# Patient Record
Sex: Female | Born: 1935 | Race: White | Hispanic: No | Smoking: Never smoker
Health system: Southern US, Community
[De-identification: ages and names within clinical notes are randomized; demographics above are authoritative.]

## PROBLEM LIST (undated history)

## (undated) DIAGNOSIS — I6529 Occlusion and stenosis of unspecified carotid artery: Secondary | ICD-10-CM

## (undated) DIAGNOSIS — R42 Dizziness and giddiness: Secondary | ICD-10-CM

## (undated) DIAGNOSIS — I4891 Unspecified atrial fibrillation: Secondary | ICD-10-CM

## (undated) DIAGNOSIS — E079 Disorder of thyroid, unspecified: Secondary | ICD-10-CM

## (undated) DIAGNOSIS — I10 Essential (primary) hypertension: Secondary | ICD-10-CM

## (undated) DIAGNOSIS — K219 Gastro-esophageal reflux disease without esophagitis: Secondary | ICD-10-CM

## (undated) DIAGNOSIS — I8390 Asymptomatic varicose veins of unspecified lower extremity: Secondary | ICD-10-CM

## (undated) DIAGNOSIS — K5730 Diverticulosis of large intestine without perforation or abscess without bleeding: Secondary | ICD-10-CM

## (undated) DIAGNOSIS — E785 Hyperlipidemia, unspecified: Secondary | ICD-10-CM

## (undated) HISTORY — DX: Asymptomatic varicose veins of unspecified lower extremity: I83.90

## (undated) HISTORY — DX: Diverticulosis of large intestine without perforation or abscess without bleeding: K57.30

## (undated) HISTORY — DX: Essential (primary) hypertension: I10

## (undated) HISTORY — DX: Hyperlipidemia, unspecified: E78.5

## (undated) HISTORY — DX: Unspecified atrial fibrillation: I48.91

## (undated) HISTORY — DX: Occlusion and stenosis of unspecified carotid artery: I65.29

## (undated) HISTORY — DX: Disorder of thyroid, unspecified: E07.9

## (undated) HISTORY — DX: Dizziness and giddiness: R42

## (undated) HISTORY — DX: Gastro-esophageal reflux disease without esophagitis: K21.9

## (undated) HISTORY — PX: APPENDECTOMY: SHX54

---

## 1988-06-09 HISTORY — PX: CAROTID ENDARTERECTOMY: SUR193

## 2001-07-21 ENCOUNTER — Encounter: Admission: RE | Admit: 2001-07-21 | Discharge: 2001-07-21 | Payer: Self-pay | Admitting: Geriatric Medicine

## 2001-07-21 ENCOUNTER — Encounter: Payer: Self-pay | Admitting: Geriatric Medicine

## 2001-10-20 ENCOUNTER — Encounter: Admission: RE | Admit: 2001-10-20 | Discharge: 2001-10-20 | Payer: Self-pay | Admitting: Geriatric Medicine

## 2001-10-20 ENCOUNTER — Encounter: Payer: Self-pay | Admitting: Geriatric Medicine

## 2002-07-27 ENCOUNTER — Ambulatory Visit (HOSPITAL_COMMUNITY): Admission: RE | Admit: 2002-07-27 | Discharge: 2002-07-27 | Payer: Self-pay | Admitting: Geriatric Medicine

## 2002-10-25 ENCOUNTER — Encounter: Payer: Self-pay | Admitting: Geriatric Medicine

## 2002-10-25 ENCOUNTER — Encounter: Admission: RE | Admit: 2002-10-25 | Discharge: 2002-10-25 | Payer: Self-pay | Admitting: Geriatric Medicine

## 2003-07-18 ENCOUNTER — Other Ambulatory Visit: Admission: RE | Admit: 2003-07-18 | Discharge: 2003-07-18 | Payer: Self-pay | Admitting: Geriatric Medicine

## 2003-10-27 ENCOUNTER — Encounter: Admission: RE | Admit: 2003-10-27 | Discharge: 2003-10-27 | Payer: Self-pay | Admitting: Geriatric Medicine

## 2004-11-13 ENCOUNTER — Encounter: Admission: RE | Admit: 2004-11-13 | Discharge: 2004-11-13 | Payer: Self-pay | Admitting: Geriatric Medicine

## 2005-01-21 ENCOUNTER — Encounter: Admission: RE | Admit: 2005-01-21 | Discharge: 2005-01-21 | Payer: Self-pay | Admitting: Geriatric Medicine

## 2006-01-20 ENCOUNTER — Encounter: Admission: RE | Admit: 2006-01-20 | Discharge: 2006-01-20 | Payer: Self-pay | Admitting: Geriatric Medicine

## 2006-08-11 ENCOUNTER — Other Ambulatory Visit: Admission: RE | Admit: 2006-08-11 | Discharge: 2006-08-11 | Payer: Self-pay | Admitting: Geriatric Medicine

## 2007-01-27 ENCOUNTER — Encounter: Admission: RE | Admit: 2007-01-27 | Discharge: 2007-01-27 | Payer: Self-pay | Admitting: Geriatric Medicine

## 2008-01-31 ENCOUNTER — Encounter: Admission: RE | Admit: 2008-01-31 | Discharge: 2008-01-31 | Payer: Self-pay | Admitting: Geriatric Medicine

## 2008-02-04 ENCOUNTER — Ambulatory Visit: Payer: Self-pay | Admitting: Vascular Surgery

## 2008-08-02 ENCOUNTER — Encounter: Admission: RE | Admit: 2008-08-02 | Discharge: 2008-08-02 | Payer: Self-pay | Admitting: Geriatric Medicine

## 2009-02-01 ENCOUNTER — Encounter: Admission: RE | Admit: 2009-02-01 | Discharge: 2009-02-01 | Payer: Self-pay | Admitting: Geriatric Medicine

## 2010-02-04 ENCOUNTER — Encounter: Admission: RE | Admit: 2010-02-04 | Discharge: 2010-02-04 | Payer: Self-pay | Admitting: Geriatric Medicine

## 2010-02-05 ENCOUNTER — Ambulatory Visit: Payer: Self-pay | Admitting: Vascular Surgery

## 2010-06-18 ENCOUNTER — Inpatient Hospital Stay (HOSPITAL_COMMUNITY)
Admission: EM | Admit: 2010-06-18 | Discharge: 2010-06-20 | Payer: Self-pay | Source: Home / Self Care | Attending: Internal Medicine | Admitting: Internal Medicine

## 2010-06-18 ENCOUNTER — Encounter (INDEPENDENT_AMBULATORY_CARE_PROVIDER_SITE_OTHER): Payer: Self-pay | Admitting: Internal Medicine

## 2010-06-19 ENCOUNTER — Encounter (INDEPENDENT_AMBULATORY_CARE_PROVIDER_SITE_OTHER): Payer: Self-pay | Admitting: Internal Medicine

## 2010-06-24 LAB — POCT I-STAT, CHEM 8
BUN: 17 mg/dL (ref 6–23)
Calcium, Ion: 1.13 mmol/L (ref 1.12–1.32)
Chloride: 104 mEq/L (ref 96–112)
Creatinine, Ser: 1.1 mg/dL (ref 0.4–1.2)
Glucose, Bld: 122 mg/dL — ABNORMAL HIGH (ref 70–99)
HCT: 38 % (ref 36.0–46.0)
Hemoglobin: 12.9 g/dL (ref 12.0–15.0)
Potassium: 3.6 mEq/L (ref 3.5–5.1)
Sodium: 139 mEq/L (ref 135–145)
TCO2: 25 mmol/L (ref 0–100)

## 2010-06-24 LAB — DIFFERENTIAL
Basophils Absolute: 0 10*3/uL (ref 0.0–0.1)
Basophils Absolute: 0 10*3/uL (ref 0.0–0.1)
Basophils Relative: 0 % (ref 0–1)
Basophils Relative: 0 % (ref 0–1)
Eosinophils Absolute: 0.3 10*3/uL (ref 0.0–0.7)
Eosinophils Absolute: 0.1 10*3/uL (ref 0.0–0.7)
Eosinophils Relative: 3 % (ref 0–5)
Eosinophils Relative: 1 % (ref 0–5)
Lymphocytes Relative: 14 % (ref 12–46)
Lymphocytes Relative: 14 % (ref 12–46)
Lymphs Abs: 1.3 10*3/uL (ref 0.7–4.0)
Lymphs Abs: 1.3 10*3/uL (ref 0.7–4.0)
Monocytes Absolute: 0.6 10*3/uL (ref 0.1–1.0)
Monocytes Absolute: 0.6 10*3/uL (ref 0.1–1.0)
Monocytes Relative: 7 % (ref 3–12)
Monocytes Relative: 6 % (ref 3–12)
Neutro Abs: 6.9 10*3/uL (ref 1.7–7.7)
Neutro Abs: 7.4 10*3/uL (ref 1.7–7.7)
Neutrophils Relative %: 76 % (ref 43–77)
Neutrophils Relative %: 79 % — ABNORMAL HIGH (ref 43–77)

## 2010-06-24 LAB — CBC
HCT: 37.6 % (ref 36.0–46.0)
HCT: 37 % (ref 36.0–46.0)
HCT: 36.7 % (ref 36.0–46.0)
Hemoglobin: 12.2 g/dL (ref 12.0–15.0)
Hemoglobin: 12.2 g/dL (ref 12.0–15.0)
Hemoglobin: 11.8 g/dL — ABNORMAL LOW (ref 12.0–15.0)
MCH: 30.3 pg (ref 26.0–34.0)
MCH: 30.7 pg (ref 26.0–34.0)
MCH: 30.4 pg (ref 26.0–34.0)
MCHC: 32.4 g/dL (ref 30.0–36.0)
MCHC: 33 g/dL (ref 30.0–36.0)
MCHC: 32.2 g/dL (ref 30.0–36.0)
MCV: 93.3 fL (ref 78.0–100.0)
MCV: 93.2 fL (ref 78.0–100.0)
MCV: 94.6 fL (ref 78.0–100.0)
Platelets: 284 10*3/uL (ref 150–400)
Platelets: 267 10*3/uL (ref 150–400)
Platelets: 272 10*3/uL (ref 150–400)
RBC: 4.03 MIL/uL (ref 3.87–5.11)
RBC: 3.97 MIL/uL (ref 3.87–5.11)
RBC: 3.88 MIL/uL (ref 3.87–5.11)
RDW: 13.2 % (ref 11.5–15.5)
RDW: 13.4 % (ref 11.5–15.5)
RDW: 13.6 % (ref 11.5–15.5)
WBC: 9 10*3/uL (ref 4.0–10.5)
WBC: 9.3 10*3/uL (ref 4.0–10.5)
WBC: 8.7 10*3/uL (ref 4.0–10.5)

## 2010-06-24 LAB — TROPONIN I
Troponin I: 0.1 ng/mL — ABNORMAL HIGH (ref 0.00–0.06)
Troponin I: 1.47 ng/mL (ref 0.00–0.06)
Troponin I: 1.21 ng/mL (ref 0.00–0.06)

## 2010-06-24 LAB — CK TOTAL AND CKMB (NOT AT ARMC)
CK, MB: 4.6 ng/mL — ABNORMAL HIGH (ref 0.3–4.0)
CK, MB: 18.8 ng/mL (ref 0.3–4.0)
CK, MB: 11.6 ng/mL (ref 0.3–4.0)
Relative Index: INVALID (ref 0.0–2.5)
Relative Index: 10.5 — ABNORMAL HIGH (ref 0.0–2.5)
Relative Index: 7.9 — ABNORMAL HIGH (ref 0.0–2.5)
Total CK: 95 U/L (ref 7–177)
Total CK: 179 U/L — ABNORMAL HIGH (ref 7–177)
Total CK: 146 U/L (ref 7–177)

## 2010-06-24 LAB — LIPID PANEL
Cholesterol: 147 mg/dL (ref 0–200)
HDL: 70 mg/dL (ref >39)
LDL Cholesterol: 69 mg/dL (ref 0–99)
Total CHOL/HDL Ratio: 2.1 RATIO
Triglycerides: 42 mg/dL (ref <150)
VLDL: 8 mg/dL (ref 0–40)

## 2010-06-24 LAB — COMPREHENSIVE METABOLIC PANEL
ALT: 17 U/L (ref 0–35)
AST: 27 U/L (ref 0–37)
Albumin: 3.3 g/dL — ABNORMAL LOW (ref 3.5–5.2)
Alkaline Phosphatase: 62 U/L (ref 39–117)
BUN: 11 mg/dL (ref 6–23)
CO2: 26 mEq/L (ref 19–32)
Calcium: 9.3 mg/dL (ref 8.4–10.5)
Chloride: 104 mEq/L (ref 96–112)
Creatinine, Ser: 0.84 mg/dL (ref 0.4–1.2)
GFR calc Af Amer: >60 mL/min (ref >60)
GFR calc non Af Amer: >60 mL/min (ref >60)
Glucose, Bld: 107 mg/dL — ABNORMAL HIGH (ref 70–99)
Potassium: 3.9 mEq/L (ref 3.5–5.1)
Sodium: 138 mEq/L (ref 135–145)
Total Bilirubin: 0.4 mg/dL (ref 0.3–1.2)
Total Protein: 6.1 g/dL (ref 6.0–8.3)

## 2010-06-24 LAB — HEPARIN LEVEL (UNFRACTIONATED)
Heparin Unfractionated: 0.43 IU/mL (ref 0.30–0.70)
Heparin Unfractionated: 0.33 IU/mL (ref 0.30–0.70)

## 2010-06-24 LAB — BASIC METABOLIC PANEL
BUN: 13 mg/dL (ref 6–23)
CO2: 24 mEq/L (ref 19–32)
Calcium: 8.8 mg/dL (ref 8.4–10.5)
Chloride: 111 mEq/L (ref 96–112)
Creatinine, Ser: 0.9 mg/dL (ref 0.4–1.2)
GFR calc Af Amer: >60 mL/min (ref >60)
GFR calc non Af Amer: 60 mL/min — ABNORMAL LOW (ref >60)
Glucose, Bld: 86 mg/dL (ref 70–99)
Potassium: 3.9 mEq/L (ref 3.5–5.1)
Sodium: 141 mEq/L (ref 135–145)

## 2010-06-24 LAB — CARDIAC PANEL(CRET KIN+CKTOT+MB+TROPI)
CK, MB: 5.7 ng/mL — ABNORMAL HIGH (ref 0.3–4.0)
CK, MB: 5 ng/mL — ABNORMAL HIGH (ref 0.3–4.0)
CK, MB: 3.4 ng/mL (ref 0.3–4.0)
Relative Index: 5.5 — ABNORMAL HIGH (ref 0.0–2.5)
Relative Index: 4.3 — ABNORMAL HIGH (ref 0.0–2.5)
Relative Index: 3.2 — ABNORMAL HIGH (ref 0.0–2.5)
Total CK: 104 U/L (ref 7–177)
Total CK: 115 U/L (ref 7–177)
Total CK: 107 U/L (ref 7–177)
Troponin I: 0.65 ng/mL (ref 0.00–0.06)
Troponin I: 0.51 ng/mL (ref 0.00–0.06)
Troponin I: 0.47 ng/mL — ABNORMAL HIGH (ref 0.00–0.06)

## 2010-06-24 LAB — MAGNESIUM: Magnesium: 2.2 mg/dL (ref 1.5–2.5)

## 2010-06-24 LAB — PHOSPHORUS: Phosphorus: 3.6 mg/dL (ref 2.3–4.6)

## 2010-06-24 LAB — POCT CARDIAC MARKERS
CKMB, poc: 3.5 ng/mL (ref 1.0–8.0)
Myoglobin, poc: 238 ng/mL (ref 12–200)
Troponin i, poc: 0.09 ng/mL (ref 0.00–0.09)

## 2010-06-24 LAB — HEMOGLOBIN A1C
Hgb A1c MFr Bld: 5.8 % — ABNORMAL HIGH (ref <5.7)
Mean Plasma Glucose: 120 mg/dL — ABNORMAL HIGH (ref <117)

## 2010-06-24 LAB — PROTIME-INR
INR: 0.94 (ref 0.00–1.49)
Prothrombin Time: 12.8 seconds (ref 11.6–15.2)

## 2010-06-24 LAB — APTT: aPTT: 27 seconds (ref 24–37)

## 2010-06-24 LAB — TSH
TSH: 1.262 u[IU]/mL (ref 0.350–4.500)
TSH: 0.916 u[IU]/mL (ref 0.350–4.500)

## 2010-06-28 NOTE — Consult Note (Signed)
[name] Love, [name] Love                ACCOUNT NO.:  [id number]  MEDICAL RECORD NO.:  [id number]          PATIENT TYPE:  INP  LOCATION:  2006                         FACILITY:  [facility]  PHYSICIAN:  Jake Bathe, MD      DATE OF BIRTH:  Mar 09, 1923  DATE OF CONSULTATION:  06/18/2010 DATE OF DISCHARGE:                                CONSULTATION   PRIMARY CARE PHYSICIAN:  Hal T. Stoneking, MD  REQUESTING PHYSICIAN:  [facility].  REASON FOR CONSULTATION:  Non-ST-elevation myocardial infarction in the setting of atrial fibrillation and rapid ventricular response.  HISTORY OF PRESENT ILLNESS:  This is an 77 year old female with a history of peripheral vascular disease status post carotid endarterectomy approximately 20 years ago, orthostatic hypotension, and hypothyroidism who was admitted from the [facility] in atrial fibrillation and rapid ventricular response.  During this episode which occurred last night at approximately 10:30, she was complaining of chest tightness and some minor difficulty breathing.  After approximately 30 minutes to an hour, in fact during transport over to the hospital, the symptoms resolved.  While she has been here in the hospital, her EKG has demonstrated sinus rhythm with very subtle elevation in her ST-segment in the lead V2 with biphasic T-wave in V1, possible ischemia.  I was called to assess her given the elevation in cardiac biomarkers. Her first set of markers drawn in the hospital were CK of 95, MB of 4.6, and a troponin of 0.1.  The second set was drawn and showed a CK of 179, MB of 18.8, and a troponin of 1.47 consistent with non-ST-elevation myocardial infarction.  Currently, she is asymptomatic.  In fact, she said, "I wished to go home" and she is here with her daughter and son-in- Social worker.  She has not had any prior myocardial infarction and she does state that occasionally she has brief episodes of dizziness or minor gait instability at  times especially when getting up from a lying down or seated position.  Dr. Tawanna Cooler Early follows her carotid arteries.  PAST MEDICAL HISTORY: 1. Orthostatic hypotension. 2. Hypothyroidism. 3. Peripheral vascular disease, carotid artery disease, status post     endarterectomy approximately 20 years ago. 4. Hyperlipidemia.  MEDICATIONS: 1. Aspirin 81 mg a day. 2. Calcium 500 mg twice a day. 3. Levothyroxine 150 mcg daily. 4. Multivitamin. 5. Simvastatin 40 mg a day.  SOCIAL HISTORY:  No smoking.  No drinking.  No drug use.  She is independent, lives at the [facility].  FAMILY HISTORY:  Her father died of a stroke.  ALLERGIES:  No known drug allergies.  REVIEW OF SYSTEMS:  No bleeding.  No wheezing.  No rashes.  No orthopnea.  No chest pain currently.  Unless as stated above, all other 12 review of systems are negative.  PHYSICAL EXAMINATION:  VITAL SIGNS:  Temperature 97.3, blood pressure 139/78, pulse is currently in the 60s and 70s, respirations 17, and saturating 100% on room air. GENERAL:  Pleasant, alert and oriented x3, in no acute distress, here with a family at bedside. EYES:  Well-perfused conjunctivae.  EOMI.  No scleral icterus. NECK:  Supple.  No lymphadenopathy.  No thyromegaly.  No carotid bruits. No JVD. HEENT:  Normocephalic and atraumatic. CARDIOVASCULAR:  Regular rate and rhythm with no appreciable murmurs, rubs, or gallops. LUNGS:  Clear to auscultation bilaterally.  Normal respiratory effort. ABDOMEN:  Soft, nontender, and normoactive bowel sounds.  No rebound. No guarding.  No bruits heard. EXTREMITIES:  No clubbing, cyanosis, or edema.  Normal distal pulses. GU:  Deferred. RECTAL:  Deferred. NEUROLOGIC:  Nonfocal.  No tremors are noted. PSYCH:  Normal affect.  LABORATORY DATA:  Cardiac biomarkers as described above.  Total cholesterol 147, LDL 69, and HDL 70.  Chest x-ray showing no acute cardiopulmonary process.  This was personally reviewed.   EKG as described above.  Her EKG from 1 a.m. last night is relatively unchanged from her current EKG.  Prior medical records from Dr. Laverle Hobby office reviewed.  EMS strips revealed atrial fibrillation, rapid ventricular response with associated ST-segment depression, possible repolarization versus ischemia.  ASSESSMENT/PLAN:  This is an 77 year old female with non-ST-elevation myocardial infarction; new-onset atrial fibrillation, now in sinus rhythm, paroxysmal; with hyperlipidemia; hypothyroidism; and orthostatic hypotension. 1. Non-ST-elevation myocardial function - I had a lengthy discussion     with her and her family about the risks and benefits of an invasive     versus noninvasive or medical management.  At this point, we will     continue with a noninvasive aggressive medical management and will     place her on IV heparin per ACS protocol, Plavix, aspirin, statin,     low-dose beta-blocker, and nitroglycerin as needed.  I described to     her that 77 year old female cohort may not have quite the benefit     from an invasive approach from a mortality perspective.  She was     not very enthusiastic about an invasive procedure.  Of course, we     will assess with echocardiogram and if she has any ongoing or     return of discomfort or worsening symptoms or significant wall     motion abnormality on echocardiogram, we may wish to proceed with     cardiac catheterization to see if there is any coronary artery     disease which will be amenable to stenting.  Full family discussion     was had.  They understand the risks and benefits.  They understand     the increase mortality of non-ST-elevation myocardial infarction. 2. Atrial fibrillation - new onset paroxysmal - I agree with prior     admitting physician that she should be on Coumadin and she and her     family will think about this.  Originally, she had refused this.     This will reduce her stroke risk.  Of course, we would  have to     watch closely for signs of bleeding. 3. Hypothyroidism - per primary team, on levothyroxine.  We will check     TSH. 4. Hyperlipidemia - continue low-dose statin.  We will follow along.  Thank you for this consult.     Jake Bathe, MD     MCS/MEDQ  D:  06/18/2010  T:  06/19/2010  Job:  [id number]  cc:   Hal T. Stoneking, M.D.  Electronically Signed by Donato Schultz MD on 06/28/2010 07:03:48 AM

## 2010-07-04 NOTE — Discharge Summary (Addendum)
[name] Love, [name] Love                ACCOUNT NO.:  [id number]  MEDICAL RECORD NO.:  [id number]          PATIENT TYPE:  INP  LOCATION:  2006                         FACILITY:  [facility]  PHYSICIAN:  Kela Millin, M.D.DATE OF BIRTH:  1922-10-04  DATE OF ADMISSION:  06/18/2010 DATE OF DISCHARGE:  06/20/2010                        DISCHARGE SUMMARY - REFERRING   DISCHARGE DIAGNOSIS: 1. Non-ST elevation myocardial infarction. 2. Paroxysmal atrial fibrillation. 3. Hypothyroidism. 4. Hyperlipidemia. 5. Osteopenia. 6. History of carotid artery disease and status post endarterectomy     about 20 years ago. 7. History of orthostatic hypotension.  PROCEDURES AND STUDIES: 1. Chest x-ray - no acute cardiopulmonary process.  Potential     pulmonary parenchymal scarring at the right lung apex. 2. Left shoulder x-ray - osteopenic appearance of bones.  There is a     small area of sclerosis of the distal superior aspect of the leftclavicle with a possible small erosive change.  Adjacent to this     area is vague calcific density in the joint space. ? History of     gout alternatively degenerative and reflect previous trauma.  CONSULTATIONS:  Cardiology - Dr. Donato Schultz.  BRIEF HISTORY:  - The patient is a an 77 year old white female with the above-listed medical problems who presented with palpitations.  She stated that she had not been feeling well on the day of presentation and later that day as she got ready to go to bed, she began experiencing palpitations and she laid down to try to rest but this persisted and she had some chest tightness associated with it.  She denied shortness of breath.  She also denied cough.  No nausea, vomiting.  Because her symptoms were persisting, she called the [name] staff and was transported to [facility] per EMS.  En route, EMS noted that the patient was in atrial fibrillation with rapid ventricular response, but she converted to normal sinus rhythm  upon arrival in the ED.  Love COURSE: 1. A non-ST elevation myocardial infarction - Upon admission, the     first set of point of care markers obtained in the ED showed a     troponin of 0.09 but followup cardiac enzymes showed the next     troponin to be elevated at 0.10 and the next one 1.47.  The EKG did     not show any acute ischemic findings.  Cardiology was consulted in     the ED and Dr. Donato Schultz saw the patient and had a lengthy     discussion with the patient and her family about the risks and     benefits of an invasive versus noninvasive or medical management.     The patient was placed on IV heparin, per acute coronary syndrome     protocol, as well as Plavix, aspirin and Zocor as well as low-dose     beta blocker and nitroglycerin as needed.  Following Dr. Anne Fu     discussion with the patient, as already mentioned, she was not very     enthusiastic about an invasive approach.  He indicated that an  echocardiogram would be done and if the patient had any ongoing or     return of discomfort or worsening of her symptoms or significant     wall motion abnormality on the echocardiogram, that a cardiac cath     may be done to see if there was any coronary artery disease     amenable to stenting.  The patient was maintained on medical     management and she did not have any further chest pain in the     Love.  The 2-D echocardiogram was done and revealed an ejection     fraction of 60% to 65% with no diagnostic regional wall motion     abnormalities.  With her remaining asymptomatic, no invasive     procedures were pursued and Dr. Anne Fu followed up with the patient     today and has recommended, from his standpoint, that she is okay     for discharge on Plavix for at least 1 month, aspirin, Zocor and     metoprolol.  She is to follow up with him on January 19 at 3:15     p.m.. 2. Paroxysmal atrial fibrillation, new onset - Upon admission, the     patient was  placed on beta blockers and heparin.  A TSH was     obtained and came back within normal limits at 0.916.  Cardiac     enzymes were cycled and the patient was found to have had a non-ST     elevation myocardial infarction, as discussed above.  The admitting     physician discussed the possibility of starting Coumadin with the     patient, but she refused stating that she did not want to start it     as her husband had died from massive blood loss while he was on     Coumadin.  Cardiology followed up with the patient and also stated     that they agreed with placing the patient on Coumadin and at that     point, the patient stated that she would discuss it with her family     and subsequently did so but even following that; she still stated     that she did not want to be placed on Coumadin.  The risks of     stroke were fully explained to the patient and family and they     voiced understanding.  Dr. Anne Fu place the patient on aspirin and     Plavix, as already mentioned.  She converted to normal sinus rhythm     and today on discharge, is remaining in normal sinus rhythm. 3. Hypothyroidism - She was maintained on her levothyroxine during her     Love stay, as already mentioned.  A TSH was checked and within     normal limits. 4. Hyperlipidemia - She was maintained on Zocor during her Love     stay. 5. Osteopenia - The patient complained of pain in her left shoulder     blade area while in the Love and x-rays were obtained and     showed findings consistent with osteopenia.  A small area of     sclerosis of the distal superior aspect of the left clavicle with     possible small erosive change was noted and also adjacent to this     area, a vague calcific density in the joint space.  The patient has     denied a history of  gout, per radiologist, possibly degenerative     and may reflect a previous trauma.  Tylenol p.r.n. was recommended     for the intermittent pain.  As for  the osteopenia, she is on     calcium and she is to continue her current calcium/vitamin D     supplements and follow up with her primary care physician.  DISCHARGE MEDICATIONS: 1. Plavix 75 mg p.o. daily. 2. Metoprolol 25 mg 1 p.o. b.i.d. 3. Aspirin 81 mg p.o. daily. 4. Calcium citrate/vitamin D 500/200 units 1 p.o. b.i.d. 5. Levothyroxine 150 mcg p.o. daily. 6. Multivitamins 1 p.o. daily. 7. Zocor 40 mg p.o. q.h.s.  FOLLOWUP CARE: 1. Primary care physician, Dr. Merlene Laughter, as scheduled on June 21, 2010. 2. Dr. Donato Schultz on June 27, 2010 at 3:15 p.m.  DISCHARGE CONDITION:  - Improved/stable.     Kela Millin, M.D.     ACV/MEDQ  D:  06/20/2010  T:  06/20/2010  Job:  [id number]  cc:   Hal T. Pete Glatter, M.D. Jake Bathe, MD  Electronically Signed by Donnalee Curry M.D. on 07/04/2010 06:32:12 AM

## 2010-07-06 NOTE — H&P (Signed)
[name] Love, Love                ACCOUNT NO.:  [id number]  MEDICAL RECORD NO.:  [id number]          PATIENT TYPE:  EMS  LOCATION:  [facility]                         FACILITY:  [facility]  PHYSICIAN:  Michiel Cowboy, MDDATE OF BIRTH:  1923-04-28  DATE OF ADMISSION:  06/18/2010 DATE OF DISCHARGE:                             HISTORY & PHYSICAL   ATTENDING PHYSICIAN:  Michiel Cowboy, MD  PRIMARY CARE PROVIDER:  Hal T. Stoneking, MD  CHIEF COMPLAINT:  The patient is an 77 year old female with history of hyperlipidemia and carotid artery disease status endarterectomy in the remote past.  The patient otherwise has been feeling very well.  Today at around 10:30 p.m., she was brushing her teeth before going to bed when she noticed heart palpitations.  She laid down and tried to rest, but they persisted and this was associated with chest tightness.  No shortness of breath.  No recent fever or chills.  No coughing.  No nausea and no vomiting.  No constipation.  No diarrhea.  Otherwise, review of systems is negative.  Of note, she did have similar feeling about a year ago which resolved by itself.  At this time, this has persisted and she called up to a nursing desk at [facility] and was transported to [facility].  In route, she was found to be with atrial fibrillation and RVR.  By the time she arrived to emergency department, she converted to sinus rhythm.  Her chest pain has currently resolved. She otherwise is asymptomatic.  REVIEW OF SYSTEMS:  Above, otherwise negative.  PAST MEDICAL HISTORY:  Significant for hypercholesteremia and hypothyroidism, and history of coronary artery disease status post endarterectomy.  SOCIAL HISTORY:  The patient never smoked or drink.  Does not abuse drugs.  Patient is independent, lives in [facility].  FAMILY HISTORY:  Significant for father who died of stroke.  ALLERGIES:  No known drug allergies.  MEDICATIONS: 1. Aspirin 81 mg  daily. 2. Calcium 500 mg twice a day. 3. Levothyroxine 150 mcg daily. 4. Multivitamins. 5. Simvastatin 40 mg daily.  PHYSICAL EXAMINATION:  VITAL SIGNS:  Temperature 97.3, blood pressure 139/78, pulse 74, respirations 17, satting 100% on room air. GENERAL:  The patient appears to be in no acute distress. HEENT:  Head nontraumatic.  Moist mucous membranes. LUNGS:  Clear to auscultation bilaterally. HEART:  Regular rhythm.  There is actually slight systolic ejection murmur could be appreciated. EXTREMITIES:  Lower extremities without clubbing, cyanosis, or edema. ABDOMEN:  Soft, nontender, nondistended. NEUROLOGICAL:  Grossly intact.  LABORATORY DATA:  White blood cell count 9.0, hemoglobin 12.9, sodium 139, potassium 3.6, creatinine 1.1, myoglobin 238, but otherwise cardiac markers negative.  Chest x-ray showing possible apical scarring, but no acute findings.  EKG currently normal sinus rhythm.  There is some nonspecific ST segment changes on lead V2, otherwise no other abnormalities.  ASSESSMENT AND PLAN:  This is an 77 year old female with paroxysmal atrial fibrillation and chest pain, paroxysmal atrial fibrillation is currently resolved.  I discussed with patient the possibility of starting on Coumadin, she refuses.  At this point, she completely does not wish to  ever start on Coumadin.  She states her husband had died from massive blood loss on Coumadin.  I explained to her the risk and benefits and risk of stroke related to paroxysmal atrial fibrillation. We agreed on starting on full-dose aspirin.  At this time, she understood the risks that I have described to her.  We will check 2-D echo, check TSH.  Chest pain which was in the setting of atrial fibrillation with rapid ventricular response, may have had some demand ischemia, but we will cycle cardiac enzymes, further risk stratified fasting lipid panel, hemoglobin A1c, and follow.  Right now, we will start her on  low-dose metoprolol to see if that will help to prevent recurrence.  Physical breakdown.  Prophylaxis, good p.o. intake, and Lovenox.  CODE STATUS:  The patient wished to be do not resuscitate, do not intubate.     Michiel Cowboy, MD     AVD/MEDQ  D:  06/18/2010  T:  06/18/2010  Job:  [id number]  cc:   Hal T. Stoneking, M.D.  Electronically Signed by Therisa Doyne MD on 07/06/2010 06:18:23 AM

## 2010-10-22 NOTE — Procedures (Signed)
CAROTID DUPLEX EXAM   INDICATION:  Followup known carotid artery disease.   HISTORY:  Diabetes:  No.  Cardiac:  No.  Hypertension:  Yes.  Smoking:  No.  Previous Surgery:  Left carotid endarterectomy at [facility] in 1990.  CV History:  No.  Amaurosis Fugax No, Paresthesias No, Hemiparesis No                                       RIGHT             LEFT  Brachial systolic pressure:         132               134  Brachial Doppler waveforms:         WNL               WNL  Vertebral direction of flow:        Antegrade         Antegrade  DUPLEX VELOCITIES (cm/sec)  CCA peak systolic                   52                121  ECA peak systolic                   25                61  ICA peak systolic                   108               59  ICA end diastolic                   30                23  PLAQUE MORPHOLOGY:                  Calcific          Heterogeneous  PLAQUE AMOUNT:                      Mild              Mild  PLAQUE LOCATION:                    ICA bulb          ICA   IMPRESSION:  1. Right internal carotid artery suggests 20%-39% stenosis.  2. Left internal carotid artery appears patent status post carotid      endarterectomy with no evidence of restenosis.  3. Antegrade flow in bilateral vertebrals.   ___________________________________________  Larina Earthly, M.D.   CB/MEDQ  D:  02/06/2010  T:  02/06/2010  Job:  [id number]

## 2010-10-22 NOTE — Procedures (Signed)
CAROTID DUPLEX EXAM   INDICATION:  Followup evaluation of known carotid artery disease.   HISTORY:  Diabetes:  No.  Cardiac:  No.  Hypertension:  Yes.  Smoking:  No.  Previous Surgery:  Left carotid endarterectomy at [facility] in 1990.  CV History:  Previous duplex 2 years ago revealed 40-59% right ICA  stenosis and no left ICA stenosis status post endarterectomy.  Amaurosis Fugax No, Paresthesias No, Hemiparesis No                                       RIGHT             LEFT  Brachial systolic pressure:         210               208  Brachial Doppler waveforms:         Triphasic         Triphasic  Vertebral direction of flow:        Antegrade         Antegrade  DUPLEX VELOCITIES (cm/sec)  CCA peak systolic                   53                110  ECA peak systolic                   48                124  ICA peak systolic                   113               45  ICA end diastolic                   38                15  PLAQUE MORPHOLOGY:                  Calcified         Mixed  PLAQUE AMOUNT:                      Moderate          Mild to moderate  PLAQUE LOCATION:                    Proximal to mid ICA                 Proximal ECA   IMPRESSION:  1. 40-59% right ICA stenosis (acoustic shadowing may mask higher      velocities).  2. No left ICA stenosis status post endarterectomy.  3. No significant change from previous study performed 01/26/2006.   ___________________________________________  Larina Earthly, M.D.   MC/MEDQ  D:  02/04/2008  T:  02/04/2008  Job:  [id number]

## 2010-10-22 NOTE — Assessment & Plan Note (Signed)
OFFICE VISIT   DRUSCILLA, PETSCH  DOB:  [birth year]                                       02/04/2008  CHART#:[id number]   The patient presents today for continued followup of her asymptomatic  carotid disease.  She is very active, healthy 77 year old white female  with a known history of extracranial cerebrovascular occlusive disease.  She is status post a left carotid endarterectomy in [place] in 1990.  She has a known moderate right carotid stenosis and we have followed  this with serial duplex evaluations.  She denies any new neurologic  deficit, amaurosis fugax, transient ischemic attack.  Continues to be  stable medically with treatment for hypertension, hyperlipidemia,  hypothyroidism.   EXAM:  Well-developed, well-nourished white female appearing stated age  89.  Blood pressure 180/83, pulse 71, respirations 16.  Her carotid  arteries without bruits bilaterally.  She is a well-healed left carotid  incision.  Her radial pulses are 2+ bilaterally.  She underwent repeat  carotid duplex in our office and this reveals no stenosis in her left  carotid endarterectomy site.  She continues to have moderate 40-59%  stenosis in the right carotid system which is unchanged.  We have  recommended continued evaluation in 2 years for repeat ultrasound.  She  will notify us should she develop any neurologic deficits.   Larina Earthly, M.D.  Electronically Signed   TFE/MEDQ  D:  02/04/2008  T:  02/07/2008  Job:  [id number]   cc:   Hal T. Stoneking, M.D.

## 2011-01-06 ENCOUNTER — Other Ambulatory Visit: Payer: Self-pay | Admitting: Geriatric Medicine

## 2011-01-06 DIAGNOSIS — Z1231 Encounter for screening mammogram for malignant neoplasm of breast: Secondary | ICD-10-CM

## 2011-02-03 ENCOUNTER — Encounter: Payer: Self-pay | Admitting: Vascular Surgery

## 2011-02-04 ENCOUNTER — Ambulatory Visit (INDEPENDENT_AMBULATORY_CARE_PROVIDER_SITE_OTHER): Payer: Medicare Other | Admitting: Vascular Surgery

## 2011-02-04 ENCOUNTER — Other Ambulatory Visit (INDEPENDENT_AMBULATORY_CARE_PROVIDER_SITE_OTHER): Payer: Medicare Other

## 2011-02-04 ENCOUNTER — Encounter: Payer: Self-pay | Admitting: Vascular Surgery

## 2011-02-04 VITALS — BP 185/95 | HR 64 | Resp 16 | Ht 65.0 in | Wt 152.0 lb

## 2011-02-04 DIAGNOSIS — Z48812 Encounter for surgical aftercare following surgery on the circulatory system: Secondary | ICD-10-CM

## 2011-02-04 DIAGNOSIS — I6529 Occlusion and stenosis of unspecified carotid artery: Secondary | ICD-10-CM

## 2011-02-04 NOTE — Progress Notes (Signed)
Subjective:     Patient ID: [name] Love, female   DOB: 1923/04/10, 77 y.o.   MRN: [id number]  HPI The patient is an 77 year old white female here today for followup of carotid disease. She had undergone left endarterectomy in the [place] 1990s. She specifically denies any focal neurologic deficits. She does have occasional dizziness. She did have an episode in January with elevated heart rate and this is being controlled with medication. There is a question of a myocardial infarction at that time.  Review of Systems Positive for heart palpitations GI is positive for hemorrhoids urinary for urinary frequency otherwise review of systems negative    Past Medical History  Diagnosis Date  . Hypertension   . Hyperlipidemia   . Constipation 02/04/2008  . Dizziness   . Thyroid disease   . Carotid artery occlusion   . Urticaria   . Esophageal reflux   . Diverticulosis of colon   . Varicose veins     History  Substance Use Topics  . Smoking status: Never Smoker   . Smokeless tobacco: Not on file  . Alcohol Use: No    History reviewed. No pertinent family history.  No Known Allergies  Current outpatient prescriptions:aspirin 325 MG EC tablet, Take 81 mg by mouth daily. , Disp: , Rfl: ;  Calcium Carb-Cholecalciferol (OYSTER SHELL CALCIUM + D PO), Take 500 mg by mouth 3 (three) times daily.  , Disp: , Rfl: ;  clopidogrel (PLAVIX) 75 MG tablet, Take 75 mg by mouth daily.  , Disp: , Rfl: ;  levothyroxine (SYNTHROID, LEVOTHROID) 200 MCG tablet, Take 200 mcg by mouth daily.  , Disp: , Rfl:  metoprolol succinate (TOPROL-XL) 25 MG 24 hr tablet, Take 25 mg by mouth daily. Takes 2 tablets daily. , Disp: , Rfl: ;  Multiple Vitamin (MULTIVITAMINS PO), Take by mouth.  , Disp: , Rfl: ;  simvastatin (ZOCOR) 40 MG tablet, Take 40 mg by mouth at bedtime.  , Disp: , Rfl: ;  enalapril (VASOTEC) 5 MG tablet, Take 5 mg by mouth daily.  , Disp: , Rfl: ;  pravastatin (PRAVACHOL) 20 MG tablet, Take 20 mg by mouth  daily.  , Disp: , Rfl:   BP 185/95  Pulse 64  Resp 16  Ht 5\' 5"  (1.651 m)  Wt 152 lb (68.947 kg)  BMI 25.29 kg/m2  Body mass index is 25.29 kg/(m^2).       Objective:   Physical Exam Well-developed well-nourished white female in no acute distress grossly intact neurologically. HEENT normal. 2+ radial femoral and dorsalis pedis pulses bilaterally. Bilateral lower extremity venous varicosities. Heart regular rate and rhythm. Chest clear bilaterally. Left carotid with a well-healed endarterectomy incision and no carotid bruits bilaterally.  Carotid duplex: No evidence of recurrent left carotid stenosis. Mild mixed plaque in the right carotid with no significant stenosis    Assessment:     Status post left carotid endarterectomy with no recurrent    Plan:    yearly ultrasound followup. The patient will notify us should she develop any neurologic deficits.

## 2011-02-11 ENCOUNTER — Ambulatory Visit
Admission: RE | Admit: 2011-02-11 | Discharge: 2011-02-11 | Disposition: A | Discharge status: Home / Self Care | Payer: Medicare Other | Source: Ambulatory Visit | Attending: Geriatric Medicine | Admitting: Geriatric Medicine

## 2011-02-11 DIAGNOSIS — Z1231 Encounter for screening mammogram for malignant neoplasm of breast: Secondary | ICD-10-CM

## 2011-02-11 NOTE — Procedures (Unsigned)
CAROTID DUPLEX EXAM  INDICATION:  Carotid disease.  HISTORY: Diabetes:  No. Cardiac:  Palpitations. Hypertension:  Yes. Smoking:  No. Previous Surgery:  Left carotid endarterectomy in 1990. CV History:  Currently asymptomatic. Amaurosis Fugax No, Paresthesias No, Hemiparesis No.                                      RIGHT             LEFT Brachial systolic pressure:         142               148 Brachial Doppler waveforms:         Normal            Normal Vertebral direction of flow:        Antegrade         Antegrade DUPLEX VELOCITIES (cm/sec) CCA peak systolic                   42                99 ECA peak systolic                   54                107 ICA peak systolic                   102               63 ICA end diastolic                   27                22 PLAQUE MORPHOLOGY:                  Mixed             Heterogenous PLAQUE AMOUNT:                      Mild              Mild PLAQUE LOCATION:                    ICA/ECA/CCA       ICA/CCA  IMPRESSION: 1. Patent left carotid endarterectomy site with no hemodynamically     significant stenosis noted in the bilateral internal carotid     arteries. 2. Plaque formations, as described above. 3. No significant change noted when compared to the previous     examination on 02/05/2010.  ___________________________________________ Larina Earthly, M.D.  CH/MEDQ  D:  02/05/2011  T:  02/05/2011  Job:  [id number]

## 2012-01-23 ENCOUNTER — Other Ambulatory Visit: Payer: Self-pay | Admitting: Geriatric Medicine

## 2012-01-23 DIAGNOSIS — Z1231 Encounter for screening mammogram for malignant neoplasm of breast: Secondary | ICD-10-CM

## 2012-02-03 ENCOUNTER — Encounter: Payer: Self-pay | Admitting: Neurosurgery

## 2012-02-04 ENCOUNTER — Ambulatory Visit (INDEPENDENT_AMBULATORY_CARE_PROVIDER_SITE_OTHER): Payer: Medicare Other | Admitting: Neurosurgery

## 2012-02-04 ENCOUNTER — Ambulatory Visit (INDEPENDENT_AMBULATORY_CARE_PROVIDER_SITE_OTHER): Payer: Medicare Other | Admitting: *Deleted

## 2012-02-04 ENCOUNTER — Encounter: Payer: Self-pay | Admitting: Neurosurgery

## 2012-02-04 VITALS — BP 152/79 | HR 64 | Resp 16 | Ht 65.5 in | Wt 154.2 lb

## 2012-02-04 DIAGNOSIS — I6529 Occlusion and stenosis of unspecified carotid artery: Secondary | ICD-10-CM

## 2012-02-04 DIAGNOSIS — Z48812 Encounter for surgical aftercare following surgery on the circulatory system: Secondary | ICD-10-CM

## 2012-02-04 NOTE — Addendum Note (Signed)
Addended by: Sharee Pimple on: 02/04/2012 02:11 PM   Modules accepted: Orders

## 2012-02-04 NOTE — Progress Notes (Signed)
[facility]  Carotid Office Note  CC: Annual carotid surveillance Referring Physician: Early  History of Present Illness: 78 year old female patient of Dr. Arbie Cookey status post left CEA in 1990. The patient denies any signs or symptoms of CVA, TIA, amaurosis fugax or any neural deficit. The patient denies any new medical diagnoses recent surgery.  Past Medical History  Diagnosis Date  . Hypertension   . Hyperlipidemia   . Constipation 02/04/2008  . Dizziness   . Thyroid disease   . Carotid artery occlusion   . Urticaria   . Esophageal reflux   . Diverticulosis of colon   . Varicose veins     ROS: [x]  Positive   [ ]  Denies    General: [ ]  Weight loss, [ ]  Fever, [ ]  chills Neurologic: [ ]  Dizziness, [ ]  Blackouts, [ ]  Seizure [ ]  Stroke, [ ]  "Mini stroke", [ ]  Slurred speech, [ ]  Temporary blindness; [ ]  weakness in arms or legs, [ ]  Hoarseness Cardiac: [ ]  Chest pain/pressure, [ ]  Shortness of breath at rest [ ]  Shortness of breath with exertion, [ ]  Atrial fibrillation or irregular heartbeat Vascular: [ ]  Pain in legs with walking, [ ]  Pain in legs at rest, [ ]  Pain in legs at night,  [ ]  Non-healing ulcer, [ ]  Blood clot in vein/DVT,   Pulmonary: [ ]  Home oxygen, [ ]  Productive cough, [ ]  Coughing up blood, [ ]  Asthma,  [ ]  Wheezing Musculoskeletal:  [ ]  Arthritis, [ ]  Low back pain, [ ]  Joint pain Hematologic: [ ]  Easy Bruising, [ ]  Anemia; [ ]  Hepatitis Gastrointestinal: [ ]  Blood in stool, [ ]  Gastroesophageal Reflux/heartburn, [ ]  Trouble swallowing Urinary: [ ]  chronic Kidney disease, [ ]  on HD - [ ]  MWF or [ ]  TTHS, [ ]  Burning with urination, [ ]  Difficulty urinating Skin: [ ]  Rashes, [ ]  Wounds Psychological: [ ]  Anxiety, [ ]  Depression   Social History History  Substance Use Topics  . Smoking status: Never Smoker   . Smokeless tobacco: Not on file  . Alcohol Use: No    Family History Family History  Problem Relation Age of Onset    . Heart attack Father     No Known Allergies  Current Outpatient Prescriptions  Medication Sig Dispense Refill  . aspirin 81 MG tablet Take 81 mg by mouth daily.      . Calcium Carb-Cholecalciferol (OYSTER SHELL CALCIUM + D PO) Take 500 mg by mouth 3 (three) times daily.        Marland Kitchen levothyroxine (SYNTHROID, LEVOTHROID) 200 MCG tablet Take 150 mcg by mouth daily.       Marland Kitchen loratadine (CLARITIN) 10 MG tablet Take 10 mg by mouth daily.      . metoprolol succinate (TOPROL-XL) 25 MG 24 hr tablet Take 25 mg by mouth daily. Takes 2 tablets daily.       . Multiple Vitamin (MULTIVITAMINS PO) Take by mouth.        . simvastatin (ZOCOR) 40 MG tablet Take 40 mg by mouth at bedtime.        Marland Kitchen aspirin 325 MG EC tablet Take 81 mg by mouth daily.       . clopidogrel (PLAVIX) 75 MG tablet Take 75 mg by mouth daily.        . enalapril (VASOTEC) 5 MG tablet Take 5 mg by mouth daily.        . pravastatin (PRAVACHOL) 20 MG tablet Take  20 mg by mouth daily.          Physical Examination  Filed Vitals:   02/04/12 1124  BP: 152/79  Pulse: 64  Resp:     Body mass index is 25.27 kg/(m^2).  General:  WDWN in NAD Gait: Normal HEENT: WNL Eyes: Pupils equal Pulmonary: normal non-labored breathing , without Rales, rhonchi,  wheezing Cardiac: RRR, without  Murmurs, rubs or gallops; Abdomen: soft, NT, no masses Skin: no rashes, ulcers noted  Vascular Exam Pulses: 2+ radial pulses Carotid bruits: Carotid pulses to auscultation no bruits are heard Extremities without ischemic changes, no Gangrene , no cellulitis; no open wounds;  Musculoskeletal: no muscle wasting or atrophy   Neurologic: A&O X 3; Appropriate Affect ; SENSATION: normal; MOTOR FUNCTION:  moving all extremities equally. Speech is fluent/normal  Non-Invasive Vascular Imaging CAROTID DUPLEX 02/04/2012  Right ICA 20 - 39 % stenosis Left ICA 20 - 39 % stenosis   ASSESSMENT/PLAN: Asymptomatic patient with mild bilateral carotid stenosis. The  patient will followup in one year with repeat carotid duplex. The patient's questions were encouraged and answered, she is in agreement with this plan.  Lauree Chandler ANP   Clinic MD: Edilia Bo

## 2012-02-13 ENCOUNTER — Ambulatory Visit: Payer: Medicare Other

## 2012-02-16 ENCOUNTER — Ambulatory Visit
Admission: RE | Admit: 2012-02-16 | Discharge: 2012-02-16 | Disposition: A | Discharge status: Home / Self Care | Payer: Medicare Other | Source: Ambulatory Visit | Attending: Geriatric Medicine | Admitting: Geriatric Medicine

## 2012-02-16 DIAGNOSIS — Z1231 Encounter for screening mammogram for malignant neoplasm of breast: Secondary | ICD-10-CM

## 2012-12-14 ENCOUNTER — Encounter (HOSPITAL_COMMUNITY): Payer: Self-pay | Admitting: *Deleted

## 2012-12-14 ENCOUNTER — Emergency Department (HOSPITAL_COMMUNITY)
Admission: EM | Admit: 2012-12-14 | Discharge: 2012-12-14 | Disposition: A | Discharge status: Home / Self Care | Payer: Medicare Other | Attending: Emergency Medicine | Admitting: Emergency Medicine

## 2012-12-14 DIAGNOSIS — Z872 Personal history of diseases of the skin and subcutaneous tissue: Secondary | ICD-10-CM | POA: Insufficient documentation

## 2012-12-14 DIAGNOSIS — Z8679 Personal history of other diseases of the circulatory system: Secondary | ICD-10-CM | POA: Insufficient documentation

## 2012-12-14 DIAGNOSIS — Z8719 Personal history of other diseases of the digestive system: Secondary | ICD-10-CM

## 2012-12-14 DIAGNOSIS — Z7982 Long term (current) use of aspirin: Secondary | ICD-10-CM | POA: Insufficient documentation

## 2012-12-14 DIAGNOSIS — R5381 Other malaise: Secondary | ICD-10-CM | POA: Insufficient documentation

## 2012-12-14 DIAGNOSIS — Z79899 Other long term (current) drug therapy: Secondary | ICD-10-CM | POA: Insufficient documentation

## 2012-12-14 DIAGNOSIS — I10 Essential (primary) hypertension: Secondary | ICD-10-CM | POA: Insufficient documentation

## 2012-12-14 DIAGNOSIS — E785 Hyperlipidemia, unspecified: Secondary | ICD-10-CM | POA: Insufficient documentation

## 2012-12-14 DIAGNOSIS — E079 Disorder of thyroid, unspecified: Secondary | ICD-10-CM | POA: Insufficient documentation

## 2012-12-14 DIAGNOSIS — K625 Hemorrhage of anus and rectum: Secondary | ICD-10-CM | POA: Insufficient documentation

## 2012-12-14 DIAGNOSIS — R42 Dizziness and giddiness: Secondary | ICD-10-CM | POA: Insufficient documentation

## 2012-12-14 LAB — CBC WITH DIFFERENTIAL/PLATELET
Basophils Absolute: 0 10*3/uL (ref 0.0–0.1)
Basophils Relative: 1 % (ref 0–1)
Eosinophils Absolute: 0.2 10*3/uL (ref 0.0–0.7)
HCT: 39.6 % (ref 36.0–46.0)
Hemoglobin: 13 g/dL (ref 12.0–15.0)
MCH: 30.6 pg (ref 26.0–34.0)
MCHC: 32.8 g/dL (ref 30.0–36.0)
Monocytes Absolute: 0.8 10*3/uL (ref 0.1–1.0)
Monocytes Relative: 10 % (ref 3–12)
RDW: 13.3 % (ref 11.5–15.5)

## 2012-12-14 LAB — TYPE AND SCREEN
ABO/RH(D): A POS
Antibody Screen: NEGATIVE

## 2012-12-14 LAB — COMPREHENSIVE METABOLIC PANEL
AST: 21 U/L (ref 0–37)
Albumin: 3.7 g/dL (ref 3.5–5.2)
BUN: 20 mg/dL (ref 6–23)
Calcium: 10 mg/dL (ref 8.4–10.5)
Creatinine, Ser: 0.84 mg/dL (ref 0.50–1.10)
Total Bilirubin: 0.4 mg/dL (ref 0.3–1.2)
Total Protein: 7 g/dL (ref 6.0–8.3)

## 2012-12-14 LAB — PROTIME-INR
INR: 0.97 (ref 0.00–1.49)
Prothrombin Time: 12.7 seconds (ref 11.6–15.2)

## 2012-12-14 NOTE — ED Provider Notes (Signed)
History    CSN: [id number] Arrival date & time 12/14/12  1127  First MD Initiated Contact with Patient 12/14/12 1138     Chief Complaint  Patient presents with  . Rectal Bleeding   (Consider location/radiation/quality/duration/timing/severity/associated sxs/prior Treatment) HPI  Patient is an 79 year old female past medical history significant for hypertension, hyperlipidemia, thyroid disease, diverticulosis, carotid artery occlusion, history of internal hemorrhoids presenting to the ED from [facility] for one episode of bright red blood per rectum on the toilet paper after having a BM this morning. Patient states she felt lightheaded, but did not pass out. Patient states she has a history of this three years ago that did not require hospitalization or blood transfusion and was d/t her internal hemorrhoids. Patient does have a history of constipation and states she recently has been constipated. Patient denies any CP, SOB, abdominal pain, nausea, vomiting.   Past Medical History  Diagnosis Date  . Hypertension   . Hyperlipidemia   . Constipation 02/04/2008  . Dizziness   . Thyroid disease   . Carotid artery occlusion   . Urticaria   . Esophageal reflux   . Diverticulosis of colon   . Varicose veins    Past Surgical History  Procedure Laterality Date  . Carotid endarterectomy    . Appendectomy     Family History  Problem Relation Age of Onset  . Heart attack Father    History  Substance Use Topics  . Smoking status: Never Smoker   . Smokeless tobacco: Not on file  . Alcohol Use: No   OB History   Grav Para Term Preterm Abortions TAB SAB Ect Mult Living                 Review of Systems  Constitutional: Positive for fatigue.  HENT: Negative.   Eyes: Negative.   Respiratory: Negative for shortness of breath.   Cardiovascular: Negative for chest pain.  Gastrointestinal: Positive for blood in stool and anal bleeding. Negative for abdominal pain and  diarrhea.  Genitourinary: Negative.   Musculoskeletal: Negative.   Skin: Negative.   Neurological: Positive for dizziness and light-headedness.    Allergies  Review of patient's allergies indicates no known allergies.  Home Medications   Current Outpatient Rx  Name  Route  Sig  Dispense  Refill  . aspirin 81 MG tablet   Oral   Take 81 mg by mouth daily.         . Calcium Carb-Cholecalciferol (OYSTER SHELL CALCIUM + D PO)   Oral   Take 500 mg by mouth 3 (three) times daily.           Marland Kitchen gabapentin (NEURONTIN) 100 MG capsule   Oral   Take 100 mg by mouth 2 (two) times daily.         . Levothyroxine Sodium (LEVOTHROID PO)   Oral   Take by mouth.         . metoprolol succinate (TOPROL-XL) 25 MG 24 hr tablet   Oral   Take 25 mg by mouth daily. Takes 2 tablets daily.          . Multiple Vitamin (MULTIVITAMINS PO)   Oral   Take by mouth.           . simvastatin (ZOCOR) 40 MG tablet   Oral   Take 40 mg by mouth at bedtime.            BP 166/81  Pulse 78  Temp(Src) 98 F (36.7  C) (Oral)  Resp 20  SpO2 98% Physical Exam  Constitutional: She is oriented to person, place, and time. She appears well-developed and well-nourished. No distress.  HENT:  Head: Normocephalic and atraumatic.  Mouth/Throat: Oropharynx is clear and moist.  Eyes: Conjunctivae are normal.  Neck: Normal range of motion. Neck supple.  Cardiovascular: Normal rate, regular rhythm, normal heart sounds and intact distal pulses.   Pulmonary/Chest: Effort normal and breath sounds normal.  Abdominal: Soft. Bowel sounds are normal. There is no tenderness.  Genitourinary: Rectal exam shows no external hemorrhoid, no fissure, no mass, no tenderness and anal tone normal.  No frank melana  Neurological: She is alert and oriented to person, place, and time.  Skin: Skin is warm and dry. She is not diaphoretic.    ED Course  Procedures (including critical care time)   Date: 12/14/2012  Rate:  76  Rhythm: normal sinus rhythm  QRS Axis: normal  Intervals: normal  ST/T Wave abnormalities: normal  Conduction Disutrbances:none  Narrative Interpretation:   Old EKG Reviewed: changes noted   Labs Reviewed  COMPREHENSIVE METABOLIC PANEL - Abnormal; Notable for the following:    GFR calc non Af Amer 61 (*)    GFR calc Af Amer 70 (*)    All other components within normal limits  OCCULT BLOOD, POC DEVICE - Abnormal; Notable for the following:    Fecal Occult Bld POSITIVE (*)    All other components within normal limits  CBC WITH DIFFERENTIAL  PROTIME-INR  APTT  TYPE AND SCREEN   No results found. 1. History of hemorrhoids   2. Bright red blood per rectum   3. Rectal bleed     MDM  Patient presented with one episode of bright red blood per rectum that occurred earlier today with an episode associated with lightheadedness. She has a history of internal hemorrhoids causing similar episodes. PE benign, no frank melena on rectal exam. Patient is able to ambulate and is not symptomatic with standing. VSS. Labs and EKG reviewed. No concern for acute emergent cause of bleeding. Advised f/u w/ GI. Patient is agreeable to plan. Patient d/w with Dr. Patria Mane, agrees with plan. Patient is stable at time of discharge         Jeannetta Ellis, PA-C 12/14/12 2021

## 2012-12-14 NOTE — ED Notes (Signed)
Phlebotomy at bedside to collect labs.

## 2012-12-14 NOTE — ED Notes (Signed)
[id number]> Expected date:<BR> Expected time:<BR> Means of arrival:<BR> Comments:<BR> GI bleed

## 2012-12-14 NOTE — ED Notes (Signed)
Pt arrives by [facility] from [facility] reports having bright red blood in stool after BM this morning. Pt has hx of hemorrhoids.

## 2012-12-14 NOTE — ED Notes (Signed)
Pharmacy tech at bedside 

## 2012-12-15 NOTE — ED Provider Notes (Signed)
Medical screening examination/treatment/procedure(s) were conducted as a shared visit with non-physician practitioner(s) and myself.  I personally evaluated the patient during the encounter  Patient is overall well-appearing.  Her vital signs are normal.  She had transient rectal bleeding after bowel movement.  This sounds like a bleeding internal hemorrhoid.  It stopped after possibly 1 minute.  This was 4-5 hours ago.  She's had no more bleeding since then.  Rectal exam demonstrates brown stool without gross blood.  No history of diverticulosis.  Discharge home in good condition.  Vitals and hemoglobin stable.  I told the patient to return the emergency department immediately for any recurrence of her rectal bleeding.  She's very reasonable and educated.  She has family support.  Lyanne Co, MD 12/15/12 [phone]

## 2013-02-03 ENCOUNTER — Ambulatory Visit: Payer: Self-pay | Admitting: Family

## 2013-02-03 ENCOUNTER — Other Ambulatory Visit: Payer: Medicare Other

## 2013-03-07 ENCOUNTER — Encounter: Payer: Self-pay | Admitting: Family

## 2013-03-08 ENCOUNTER — Ambulatory Visit (INDEPENDENT_AMBULATORY_CARE_PROVIDER_SITE_OTHER): Payer: Medicare Other | Admitting: Family

## 2013-03-08 ENCOUNTER — Encounter: Payer: Self-pay | Admitting: Family

## 2013-03-08 ENCOUNTER — Ambulatory Visit (HOSPITAL_COMMUNITY)
Admission: RE | Admit: 2013-03-08 | Discharge: 2013-03-08 | Disposition: A | Discharge status: Home / Self Care | Payer: Medicare Other | Source: Ambulatory Visit | Attending: Neurosurgery | Admitting: Neurosurgery

## 2013-03-08 DIAGNOSIS — Z48812 Encounter for surgical aftercare following surgery on the circulatory system: Secondary | ICD-10-CM

## 2013-03-08 DIAGNOSIS — I6529 Occlusion and stenosis of unspecified carotid artery: Secondary | ICD-10-CM

## 2013-03-08 NOTE — Patient Instructions (Addendum)
Stroke Prevention Some medical conditions and behaviors are associated with an increased chance of having a stroke. You may prevent a stroke by making healthy choices and managing medical conditions. Reduce your risk of having a stroke by:  Staying physically active. Get at least 30 minutes of activity on most or all days.  Not smoking. It may also be helpful to avoid exposure to secondhand smoke.  Limiting alcohol use. Moderate alcohol use is considered to be:  No more than 2 drinks per day for men.  No more than 1 drink per day for nonpregnant women.  Eating healthy foods.  Include 5 or more servings of fruits and vegetables a day.  Certain diets may be prescribed to address high blood pressure, high cholesterol, diabetes, or obesity.  Managing your cholesterol levels.  A low-saturated fat, low-trans fat, low-cholesterol, and high-fiber diet may control cholesterol levels.  Take any prescribed medicines to control cholesterol as directed by your caregiver.  Managing your diabetes.  A controlled-carbohydrate, controlled-sugar diet is recommended to manage diabetes.  Take any prescribed medicines to control diabetes as directed by your caregiver.  Controlling your high blood pressure (hypertension).  A low-salt (sodium), low-saturated fat, low-trans fat, and low-cholesterol diet is recommended to manage high blood pressure.  Take any prescribed medicines to control hypertension as directed by your caregiver.  Maintaining a healthy weight.  A reduced-calorie, low-sodium, low-saturated fat, low-trans fat, low-cholesterol diet is recommended to manage weight.  Stopping drug abuse.  Avoiding birth control pills.  Talk to your caregiver about the risks of taking birth control pills if you are over 35 years old, smoke, get migraines, or have ever had a blood clot.  Getting evaluated for sleep disorders (sleep apnea).  Talk to your caregiver about getting a sleep evaluation  if you snore a lot or have excessive sleepiness.  Taking medicines as directed by your caregiver.  For some people, aspirin or blood thinners (anticoagulants) are helpful in reducing the risk of forming abnormal blood clots that can lead to stroke. If you have the irregular heart rhythm of atrial fibrillation, you should be on a blood thinner unless there is a good reason you cannot take them.  Understand all your medicine instructions. SEEK IMMEDIATE MEDICAL CARE IF:   You have sudden weakness or numbness of the face, arm, or leg, especially on one side of the body.  You have sudden confusion.  You have trouble speaking (aphasia) or understanding.  You have sudden trouble seeing in one or both eyes.  You have sudden trouble walking.  You have dizziness.  You have a loss of balance or coordination.  You have a sudden, severe headache with no known cause.  You have new chest pain or an irregular heartbeat. Any of these symptoms may represent a serious problem that is an emergency. Do not wait to see if the symptoms will go away. Get medical help right away. Call your local emergency services (911 in [place] Do not drive yourself to the hospital. Document Released: 07/03/2004 Document Revised: 08/18/2011 Document Reviewed: 01/13/2011 [employer] Patient Information 2014 [employer].  

## 2013-03-08 NOTE — Progress Notes (Signed)
Established Carotid Patient  Previous Carotid surgery: Yes  History of Present Illness  [name] Love is a 79 y.o. female who had undergone left carotid endarterectomy in the early 1990's in [place], [place]. Dr. Arbie Cookey has been following her since about 2003.  Patient reports possible mild stroke before her left CEA, but denies stroke or TIA symptoms since then.  The patient denies amaurosis fugax or monocular blindness.  The patient  denies facial drooping.  Pt. denies hemiplegia.  The patient denies receptive or expressive aphasia.  Pt. denies extremity weakness. Patient denies claudication, denies non-healing ulcers, denies dyspnea, denies chest pain. Her PCP is Dr. Merlene Laughter.  The patient's previous neurologic deficits are Unchanged.   Patient denies New Medical or Surgical History.  Pt Diabetic: No Pt smoker: non-smoker  Pt meds include: Statin : Yes Betablocker: Yes ASA: Yes Other anticoagulants/antiplatelets: none   Past Medical History  Diagnosis Date  . Hypertension   . Hyperlipidemia   . Constipation 02/04/2008  . Dizziness   . Thyroid disease   . Carotid artery occlusion   . Urticaria   . Esophageal reflux   . Diverticulosis of colon   . Varicose veins     Social History History  Substance Use Topics  . Smoking status: Never Smoker   . Smokeless tobacco: Never Used  . Alcohol Use: No    Family History Family History  Problem Relation Age of Onset  . Heart attack Father   . Heart attack Brother   . Heart attack Brother     Surgical History Past Surgical History  Procedure Laterality Date  . Appendectomy    . Carotid endarterectomy Left 1990    No Known Allergies  Current Outpatient Prescriptions  Medication Sig Dispense Refill  . aspirin 81 MG tablet Take 81 mg by mouth daily.      . Calcium Carb-Cholecalciferol (OYSTER SHELL CALCIUM + D PO) Take 500 mg by mouth 3 (three) times daily.        . Levothyroxine Sodium (LEVOTHROID PO) Take by  mouth.      . metoprolol succinate (TOPROL-XL) 25 MG 24 hr tablet Take 25 mg by mouth daily. Takes 2 tablets daily.       . Multiple Vitamin (MULTIVITAMINS PO) Take by mouth.        . simvastatin (ZOCOR) 40 MG tablet Take 40 mg by mouth at bedtime.        . gabapentin (NEURONTIN) 100 MG capsule Take 100 mg by mouth 2 (two) times daily.       No current facility-administered medications for this visit.    Review of Systems : [x]  Positive   [ ]  Denies  General:[ ]  Weight loss,  [ ]  Weight gain, [ ]  Loss of appetite, [ ]  Fever, [ ]  chills  Neurologic: [ ]  Dizziness, [ ]  Blackouts, [ ]  Headaches, [ ]  Seizure [ ]  Stroke, [ ]  "Mini stroke", [ ]  Slurred speech, [ ]  Temporary blindness;  [ ] weakness,  Ear/Nose/Throat: [ ]  Change in hearing, [ ]  Nose bleeds, [ ]  Hoarseness  Vascular:[ ]  Pain in legs with walking, [ ]  Pain in feet while lying flat , [ ]   Non-healing ulcer, [ ]  Blood clot in vein,    Pulmonary: [ ]  Home oxygen, [ ]   Productive cough, [ ]  Bronchitis, [ ]  Coughing up blood,  [ ]  Asthma, [ ]  Wheezing  Musculoskeletal:  [ ]  Arthritis, Arly.Keller ] Joint pain, [ ]  low back pain  Cardiac: [ ]   Chest pain, [ ]  Shortness of breath when lying flat, [ ]  Shortness of breath with exertion, [ ]  Palpitations, [ ]  Heart murmur, [ ]   Atrial fibrillation  Hematologic:[ ]  Easy Bruising, [ ]  Anemia; [ ]  Hepatitis  Psychiatric: [ ]   Depression, [ ]  Anxiety   Gastrointestinal: [ ]  Black stool, [ ]  Blood in stool, [ ]  Peptic ulcer disease,  [ ]  Gastroesophageal Reflux, [ ]  Trouble swallowing, [ ]  Diarrhea, [ ]  Constipation  Urinary: [ ]  chronic Kidney disease, [ ]  on HD, [ ]  Burning with urination, [ ]  Frequent urination, [ ]  Difficulty urinating;   Skin: [ ]  Rashes, [ ]  Wounds    Physical Examination  Filed Vitals:   03/08/13 1127  BP: 170/83  Pulse: 67  Resp:    Filed Weights   03/08/13 1121  Weight: 153 lb (69.4 kg)   Body mass index is 25.46 kg/(m^2).  General: WDWN female in NAD GAIT:  normal Eyes: PERRLA Pulmonary:  CTAB, Negative  Rales, Negative rhonchi, & Negative wheezing.  Cardiac: regular Rhythm ,  Negative Murmurs.  VASCULAR EXAM Carotid Bruits Left Right   Negative Negative   Aorta is mildly palpable. Bilateral radial pulses are 2+ palpable.                                                                                                                             LE Pulses LEFT RIGHT       FEMORAL   palpable   palpable        POPLITEAL  not palpable   not palpable       POSTERIOR TIBIAL  not palpable   not palpable        DORSALIS PEDIS      ANTERIOR TIBIAL  palpable   palpable       Gastrointestinal: soft, nontender, BS WNL, no r/g,  negative masses.  Musculoskeletal: Negative muscle atrophy/wasting. M/S 5/5 throughout, Extremities without ischemic changes.  Neurologic: A&O X 3; Appropriate Affect ; SENSATION ;normal;  Speech is normal CN 2-12 intact  except use of hearing aids in both ears, Pain and light touch intact in extremities, Motor exam as listed above.   Non-Invasive Vascular Imaging CAROTID DUPLEX 03/08/2013   Right ICA <40% stenosis Left ICA is patent at endarterectomy site with minimal plaque formation  These findings are Unchanged from previous exam  Assessment: [name] Love is a 79 y.o. female who presents with asymptomatic patent left CEA site and <40% stenosis in her right ICA. The  ICA stenosis is  Unchanged from previous exam. Her only risk factor for stroke is her uncontrolled blood pressure, but patient states [facility] and is not this elevated.  Plan: Follow-up in 1 year with Carotid Duplex scan.  I discussed in depth with the patient the nature of atherosclerosis, and emphasized the importance of maximal medical management including strict control of blood  pressure, blood glucose, and lipid levels, obtaining regular exercise, and continued cessation of smoking.   The patient is aware that without maximal medical management the underlying atherosclerotic disease process will progress, limiting the benefit of any interventions. The patient was given information about stroke prevention and what symptoms should prompt the patient to seek immediate medical care. Thank you for allowing [place] to participate in this patient's care.  Charisse March, RN, MSN, FNP-C [facility] Office: [phone]  Clinic Physician: Early  03/08/2013 12:21 PM

## 2013-03-09 NOTE — Addendum Note (Signed)
Addended by: Sharee Pimple on: 03/09/2013 08:16 AM   Modules accepted: Orders

## 2013-04-04 ENCOUNTER — Other Ambulatory Visit: Payer: Self-pay

## 2013-04-04 DIAGNOSIS — Z1231 Encounter for screening mammogram for malignant neoplasm of breast: Secondary | ICD-10-CM

## 2013-04-29 ENCOUNTER — Ambulatory Visit
Admission: RE | Admit: 2013-04-29 | Discharge: 2013-04-29 | Disposition: A | Discharge status: Home / Self Care | Payer: Medicare Other | Source: Ambulatory Visit

## 2013-04-29 DIAGNOSIS — Z1231 Encounter for screening mammogram for malignant neoplasm of breast: Secondary | ICD-10-CM

## 2013-06-14 ENCOUNTER — Other Ambulatory Visit (HOSPITAL_COMMUNITY): Payer: Medicare Other

## 2013-06-16 ENCOUNTER — Ambulatory Visit (HOSPITAL_COMMUNITY): Payer: Medicare Other | Attending: Geriatric Medicine | Admitting: Radiology

## 2013-06-16 ENCOUNTER — Other Ambulatory Visit (HOSPITAL_COMMUNITY): Payer: Self-pay | Admitting: Radiology

## 2013-06-16 DIAGNOSIS — R011 Cardiac murmur, unspecified: Secondary | ICD-10-CM | POA: Insufficient documentation

## 2013-06-16 DIAGNOSIS — E785 Hyperlipidemia, unspecified: Secondary | ICD-10-CM | POA: Insufficient documentation

## 2013-06-16 DIAGNOSIS — I10 Essential (primary) hypertension: Secondary | ICD-10-CM | POA: Insufficient documentation

## 2013-06-16 NOTE — Progress Notes (Signed)
Echocardiogram performed.  

## 2014-03-08 ENCOUNTER — Encounter: Payer: Self-pay | Admitting: Family

## 2014-03-09 ENCOUNTER — Ambulatory Visit (INDEPENDENT_AMBULATORY_CARE_PROVIDER_SITE_OTHER): Payer: Medicare Other | Admitting: Family

## 2014-03-09 ENCOUNTER — Ambulatory Visit (HOSPITAL_COMMUNITY)
Admission: RE | Admit: 2014-03-09 | Discharge: 2014-03-09 | Disposition: A | Discharge status: Home / Self Care | Payer: Medicare Other | Source: Ambulatory Visit | Attending: Family | Admitting: Family

## 2014-03-09 ENCOUNTER — Encounter: Payer: Self-pay | Admitting: Family

## 2014-03-09 VITALS — BP 141/77 | HR 62 | Resp 16 | Ht 65.5 in | Wt 156.0 lb

## 2014-03-09 DIAGNOSIS — Z48812 Encounter for surgical aftercare following surgery on the circulatory system: Secondary | ICD-10-CM | POA: Insufficient documentation

## 2014-03-09 DIAGNOSIS — I6522 Occlusion and stenosis of left carotid artery: Secondary | ICD-10-CM

## 2014-03-09 DIAGNOSIS — I6529 Occlusion and stenosis of unspecified carotid artery: Secondary | ICD-10-CM | POA: Insufficient documentation

## 2014-03-09 NOTE — Progress Notes (Signed)
Established Carotid Patient   History of Present Illness  [name] Love is a 80 y.o. female who had undergone left carotid endarterectomy in the early 1990's in [place], [place]. Dr. Arbie Cookey has been following her since about 2003.  She returns today for follow up. Patient reports possible mild stroke before her left CEA, but denies stroke or TIA symptoms since then. The patient denies amaurosis fugax or monocular blindness. The patient denies facial drooping.  Pt. denies hemiplegia. The patient denies receptive or expressive aphasia. Pt. denies extremity weakness.  Patient denies claudication, denies non-healing ulcers, denies dyspnea, denies chest pain.  Her PCP is Dr. Merlene Laughter.   Patient denies New Medical or Surgical History.   Pt Diabetic: No  Pt smoker: non-smoker  Pt meds include:  Statin : Yes  Betablocker: Yes  ASA: Yes  Other anticoagulants/antiplatelets: none   Past Medical History  Diagnosis Date  . Hypertension   . Hyperlipidemia   . Constipation 02/04/2008  . Dizziness   . Thyroid disease   . Carotid artery occlusion   . Urticaria   . Esophageal reflux   . Diverticulosis of colon   . Varicose veins   . Atrial fibrillation     Social History History  Substance Use Topics  . Smoking status: Never Smoker   . Smokeless tobacco: Never Used  . Alcohol Use: No    Family History Family History  Problem Relation Age of Onset  . Heart attack Father   . Heart disease Father     After age 52  . Heart attack Brother   . Heart attack Brother   . Cancer Mother     Breast  . Hypertension Mother   . Varicose Veins Mother   . Cancer Sister     ? Breast    Surgical History Past Surgical History  Procedure Laterality Date  . Appendectomy    . Carotid endarterectomy Left 1990    No Known Allergies  Current Outpatient Prescriptions  Medication Sig Dispense Refill  . amlodipine-benazepril (LOTREL) 2.5-10 MG per capsule Take 1 capsule by mouth daily.       Marland Kitchen aspirin 81 MG tablet Take 81 mg by mouth daily.      . Calcium Carb-Cholecalciferol (OYSTER SHELL CALCIUM + D PO) Take 500 mg by mouth 3 (three) times daily.        . Levothyroxine Sodium (LEVOTHROID PO) Take by mouth.      . metoprolol succinate (TOPROL-XL) 25 MG 24 hr tablet Take 25 mg by mouth daily. Takes 2 tablets daily.       . Multiple Vitamin (MULTIVITAMINS PO) Take by mouth.        . simvastatin (ZOCOR) 40 MG tablet Take 40 mg by mouth at bedtime.        . gabapentin (NEURONTIN) 100 MG capsule Take 100 mg by mouth 2 (two) times daily.       No current facility-administered medications for this visit.    Review of Systems : See HPI for pertinent positives and negatives.  Physical Examination  Filed Vitals:   03/09/14 1105 03/09/14 1108  BP: 138/81 141/77  Pulse: 61 62  Resp:  16  Height:  5' 5.5" (1.664 m)  Weight:  156 lb (70.761 kg)  SpO2:  96%   Body mass index is 25.56 kg/(m^2).  General: WDWN female in NAD  GAIT: normal  Eyes: PERRLA  Pulmonary: CTAB, Negative Rales, Negative rhonchi, & Negative wheezing.  Cardiac: regular Rhythm , Negative  Murmur detected.   VASCULAR EXAM  Carotid Bruits  Left  Right    Negative  Negative    Aorta is mildly palpable.  Bilateral radial pulses are 2+ palpable.   LE Pulses  LEFT  RIGHT   FEMORAL  palpable  palpable   POPLITEAL  not palpable  not palpable   POSTERIOR TIBIAL  not palpable  not palpable   DORSALIS PEDIS  ANTERIOR TIBIAL  palpable  palpable    Gastrointestinal: soft, nontender, BS WNL, no r/g, no palpated masses.  Musculoskeletal: Negative muscle atrophy/wasting. M/S 5/5 throughout, Extremities without ischemic changes.  Neurologic: A&O X 3; Appropriate Affect ; SENSATION ;normal;  Speech is normal  CN 2-12 intact except use of hearing aids in both ears, Pain and light touch intact in extremities, Motor exam as listed above.   Non-Invasive Vascular Imaging CAROTID DUPLEX 03/09/2014   CEREBROVASCULAR  DUPLEX EVALUATION    INDICATION: Carotid endarterectomy     PREVIOUS INTERVENTION(S): Left carotid endarterectomy in 1990    DUPLEX EXAM:     RIGHT  LEFT  Peak Systolic Velocities (cm/s) End Diastolic Velocities (cm/s) Plaque LOCATION Peak Systolic Velocities (cm/s) End Diastolic Velocities (cm/s) Plaque  55 11  CCA PROXIMAL 69 14 CP  45 14 HT CCA MID 99 15 HT  46 13 HT CCA DISTAL 46 0   64 12 CP ECA 63 9 HT  114 24 CP ICA PROXIMAL 43 14 CP  105 25  ICA MID 71 19   98 19  ICA DISTAL 71 17     2.48 ICA / CCA Ratio (PSV) Not Calculated  Antegrade Vertebral Flow Antegrade  144 Brachial Systolic Pressure (mmHg) 158  Multiphasic (subclavian artery) Brachial Artery Waveforms Multiphasic (subclavian artery)    Plaque Morphology:  HM = Homogeneous, HT = Heterogeneous, CP = Calcific Plaque, SP = Smooth Plaque, IP = Irregular Plaque     ADDITIONAL FINDINGS: No significant stenosis of the bilateral external or common carotid arteries.    IMPRESSION: Patent left carotid endarterectomy site with Doppler velocities suggestive of less than 40% stenoses of the bilateral proximal internal carotid arteries.    Compared to the previous exam:  No significant change noted when compared to the previous exam on 03/08/13.     Assessment: [name] Love is a 80 y.o. female who is s/p  left carotid [place] in the early [place] Dr. Arbie CookeyEarly has been following her since about 2003.  Patient reports possible mild stroke before her left CEA, but denies stroke or TIA symptoms since then. Today's carotid Duplex demonstrates a patent left carotid endarterectomy site with Doppler velocities suggestive of less than 40% stenoses of the bilateral proximal internal carotid arteries. No significant change noted when compared to the previous exam on 03/08/13.   Plan: Follow-up in 1 year with Carotid Duplex scan.   I discussed in depth with the patient the nature of atherosclerosis, and emphasized  the importance of maximal medical management including strict control of blood pressure, blood glucose, and lipid levels, obtaining regular exercise, and continued cessation of smoking.  The patient is aware that without maximal medical management the underlying atherosclerotic disease process will progress, limiting the benefit of any interventions. The patient was given information about stroke prevention and what symptoms should prompt the patient to seek immediate medical care. Thank you for allowing us to participate in this patient's care.  Charisse MarchSuzanne Demitris Pokorny, RN, MSN, FNP-C [facility] Office: [phone]  Clinic  Physician: Darrick Penna  03/09/2014 11:24 AM

## 2014-03-09 NOTE — Patient Instructions (Signed)
Stroke Prevention Some medical conditions and behaviors are associated with an increased chance of having a stroke. You may prevent a stroke by making healthy choices and managing medical conditions. HOW CAN I REDUCE MY RISK OF HAVING A STROKE?   Stay physically active. Get at least 30 minutes of activity on most or all days.  Do not smoke. It may also be helpful to avoid exposure to secondhand smoke.  Limit alcohol use. Moderate alcohol use is considered to be:  No more than 2 drinks per day for men.  No more than 1 drink per day for nonpregnant women.  Eat healthy foods. This involves:  Eating 5 or more servings of fruits and vegetables a day.  Making dietary changes that address high blood pressure (hypertension), high cholesterol, diabetes, or obesity.  Manage your cholesterol levels.  Making food choices that are high in fiber and low in saturated fat, trans fat, and cholesterol may control cholesterol levels.  Take any prescribed medicines to control cholesterol as directed by your health care provider.  Manage your diabetes.  Controlling your carbohydrate and sugar intake is recommended to manage diabetes.  Take any prescribed medicines to control diabetes as directed by your health care provider.  Control your hypertension.  Making food choices that are low in salt (sodium), saturated fat, trans fat, and cholesterol is recommended to manage hypertension.  Take any prescribed medicines to control hypertension as directed by your health care provider.  Maintain a healthy weight.  Reducing calorie intake and making food choices that are low in sodium, saturated fat, trans fat, and cholesterol are recommended to manage weight.  Stop drug abuse.  Avoid taking birth control pills.  Talk to your health care provider about the risks of taking birth control pills if you are over 35 years old, smoke, get migraines, or have ever had a blood clot.  Get evaluated for sleep  disorders (sleep apnea).  Talk to your health care provider about getting a sleep evaluation if you snore a lot or have excessive sleepiness.  Take medicines only as directed by your health care provider.  For some people, aspirin or blood thinners (anticoagulants) are helpful in reducing the risk of forming abnormal blood clots that can lead to stroke. If you have the irregular heart rhythm of atrial fibrillation, you should be on a blood thinner unless there is a good reason you cannot take them.  Understand all your medicine instructions.  Make sure that other conditions (such as anemia or atherosclerosis) are addressed. SEEK IMMEDIATE MEDICAL CARE IF:   You have sudden weakness or numbness of the face, arm, or leg, especially on one side of the body.  Your face or eyelid droops to one side.  You have sudden confusion.  You have trouble speaking (aphasia) or understanding.  You have sudden trouble seeing in one or both eyes.  You have sudden trouble walking.  You have dizziness.  You have a loss of balance or coordination.  You have a sudden, severe headache with no known cause.  You have new chest pain or an irregular heartbeat. Any of these symptoms may represent a serious problem that is an emergency. Do not wait to see if the symptoms will go away. Get medical help at once. Call your local emergency services (911 in [place] Do not drive yourself to the hospital. Document Released: 07/03/2004 Document Revised: 10/10/2013 Document Reviewed: 11/26/2012 Exit[employer] Patient Information 2015 [employer]. This information is not intended to replace advice given   to you by your health care provider. Make sure you discuss any questions you have with your health care provider.  

## 2014-10-16 ENCOUNTER — Encounter: Payer: Self-pay | Admitting: *Deleted

## 2014-11-14 ENCOUNTER — Other Ambulatory Visit: Payer: Self-pay | Admitting: Internal Medicine

## 2014-11-14 DIAGNOSIS — R7989 Other specified abnormal findings of blood chemistry: Secondary | ICD-10-CM

## 2015-03-12 ENCOUNTER — Ambulatory Visit: Payer: Self-pay | Admitting: Family

## 2015-03-12 ENCOUNTER — Encounter (HOSPITAL_COMMUNITY): Payer: Self-pay

## 2015-03-13 ENCOUNTER — Other Ambulatory Visit (HOSPITAL_COMMUNITY): Payer: Medicare Other

## 2015-03-13 ENCOUNTER — Ambulatory Visit: Payer: Medicare Other | Admitting: Family

## 2015-03-27 ENCOUNTER — Encounter: Payer: Self-pay | Admitting: Family

## 2015-03-28 ENCOUNTER — Ambulatory Visit (INDEPENDENT_AMBULATORY_CARE_PROVIDER_SITE_OTHER): Payer: Medicare Other | Admitting: Family

## 2015-03-28 ENCOUNTER — Encounter: Payer: Self-pay | Admitting: Family

## 2015-03-28 ENCOUNTER — Ambulatory Visit (HOSPITAL_COMMUNITY)
Admission: RE | Admit: 2015-03-28 | Discharge: 2015-03-28 | Disposition: A | Discharge status: Home / Self Care | Payer: Medicare Other | Source: Ambulatory Visit | Attending: Family | Admitting: Family

## 2015-03-28 VITALS — BP 174/90 | HR 64 | Temp 97.9°F | Resp 16 | Ht 65.5 in | Wt 152.0 lb

## 2015-03-28 DIAGNOSIS — E785 Hyperlipidemia, unspecified: Secondary | ICD-10-CM | POA: Insufficient documentation

## 2015-03-28 DIAGNOSIS — Z9889 Other specified postprocedural states: Secondary | ICD-10-CM

## 2015-03-28 DIAGNOSIS — I6522 Occlusion and stenosis of left carotid artery: Secondary | ICD-10-CM | POA: Diagnosis not present

## 2015-03-28 DIAGNOSIS — Z48812 Encounter for surgical aftercare following surgery on the circulatory system: Secondary | ICD-10-CM

## 2015-03-28 DIAGNOSIS — I10 Essential (primary) hypertension: Secondary | ICD-10-CM | POA: Diagnosis not present

## 2015-03-28 NOTE — Progress Notes (Signed)
Established Carotid Patient   History of Present Illness  [name] Love is a 81 y.o. female who had undergone left carotid endarte[place] in the early 1990's [place] Dr. Arbie CookeyEarly has been monitoring her extracranial carotid arteries since about 2003.  She returns today for follow up. Patient reports possible mild stroke before her left CEA, but denies stroke or TIA symptoms since then. The patient denies a history of amaurosis fugax or monocular blindness, unilateral facial drooping, hemiplegia, or receptive or expressive aphasia.  She denies claudication, denies non-healing ulcers, denies dyspnea, denies chest pain.  Her PCP is Dr. Merlene LaughterHal Stoneking.   Patient reports New Medical or Surgical History: gout diagnosed in the base of her left great toe, states this is not bothering her much.   Pt Diabetic: No  Pt smoker: non-smoker   Pt meds include:  Statin : Yes  Betablocker: Yes  ASA: Yes  Other anticoagulants/antiplatelets: none   Past Medical History  Diagnosis Date  . Hypertension   . Hyperlipidemia   . Constipation 02/04/2008  . Dizziness   . Thyroid disease   . Carotid artery occlusion   . Urticaria   . Esophageal reflux   . Diverticulosis of colon   . Varicose veins   . Atrial fibrillation [facility]     Social History Social History  Substance Use Topics  . Smoking status: Never Smoker   . Smokeless tobacco: Never Used  . Alcohol Use: No    Family History Family History  Problem Relation Age of Onset  . Heart attack Father   . Heart disease Father     After age 79  . Heart attack Brother   . Heart attack Brother   . Breast cancer Mother   . Hypertension Mother   . Varicose Veins Mother   . Cancer Sister     ? Breast    Surgical History Past Surgical History  Procedure Laterality Date  . Appendectomy    . Carotid endarterectomy Left 1990    No Known Allergies  Current Outpatient Prescriptions  Medication Sig Dispense Refill  .  amlodipine-benazepril (LOTREL) 2.5-10 MG per capsule Take 1 capsule by mouth daily.    Marland Kitchen. aspirin 81 MG tablet Take 81 mg by mouth daily.    . Calcium Carb-Cholecalciferol (OYSTER SHELL CALCIUM + D PO) Take 500 mg by mouth 2 (two) times daily.     Marland Kitchen. gabapentin (NEURONTIN) 100 MG capsule Take 100 mg by mouth 2 (two) times daily. Per patient taking only as needed    . Levothyroxine Sodium (LEVOTHROID PO) Take by mouth.    . metoprolol succinate (TOPROL-XL) 25 MG 24 hr tablet Take 25 mg by mouth daily. Takes 2 tablets daily.     . Multiple Vitamin (MULTIVITAMINS PO) Take by mouth.      . simvastatin (ZOCOR) 40 MG tablet Take 40 mg by mouth at bedtime.       No current facility-administered medications for this visit.    Review of Systems : See HPI for pertinent positives and negatives.  Physical Examination  Filed Vitals:   03/28/15 1524 03/28/15 1531 03/28/15 1532  BP: 182/83 174/92 174/90  Pulse: 65 65 64  Temp: 97.9 F (36.6 C)    Resp: 16    Height: 5' 5.5" (1.664 m)    Weight: 152 lb (68.947 kg)    SpO2: 95%     Body mass index is 24.9 kg/(m^2).  General: WDWN female in NAD  GAIT: normal  Eyes:  PERRLA  Pulmonary: CTAB, no rales, no rhonchi, & no wheezing.  Cardiac: regular rhythm, no murmur detected.   VASCULAR EXAM  Carotid Bruits  Left  Right    Negative  Negative    Aorta is mildly palpable.  Bilateral radial pulses are 2+ palpable.   LE Pulses  LEFT  RIGHT   FEMORAL  palpable  palpable   POPLITEAL  not palpable  not palpable   POSTERIOR TIBIAL  not palpable  not palpable   DORSALIS PEDIS  ANTERIOR TIBIAL  palpable  palpable    Gastrointestinal: soft, nontender, BS WNL, no r/g, no palpated masses.  Musculoskeletal: Negative muscle atrophy/wasting. M/S 5/5 throughout, Extremities without ischemic changes.  Neurologic: A&O X 3; Appropriate Affect ; SENSATION ;normal;  Speech is normal  CN 2-12 intact except use of hearing aids  in both ears, Pain and light touch intact in extremities, Motor exam as listed above.           Non-Invasive Vascular Imaging CAROTID DUPLEX 03/28/2015   CEREBROVASCULAR DUPLEX EVALUATION    INDICATION: Carotid artery disease     PREVIOUS INTERVENTION(S): Left carotid endarterectomy in 1990.    DUPLEX EXAM:     RIGHT  LEFT  Peak Systolic Velocities (cm/s) End Diastolic Velocities (cm/s) Plaque LOCATION Peak Systolic Velocities (cm/s) End Diastolic Velocities (cm/s) Plaque  46 9  CCA PROXIMAL 79 15 CP  50 12 HT CCA MID 95 20 HT  52 14 HT CCA DISTAL 115 16   42 8 CP ECA 70 12 HT  101 22 CP ICA PROXIMAL 51 11 CP  105 25  ICA MID 58 14   68 18  ICA DISTAL 68 20     2.1 ICA / CCA Ratio (PSV) NA  Antegrade  Vertebral Flow Antegrade    Brachial Systolic Pressure (mmHg)   Multiphasic (Subclavian artery) Brachial Artery Waveforms Multiphasic (Subclavian artery)    Plaque Morphology:  HM = Homogeneous, HT = Heterogeneous, CP = Calcific Plaque, SP = Smooth Plaque, IP = Irregular Plaque  ADDITIONAL FINDINGS:     IMPRESSION: Right internal carotid artery velocities suggest a <40% stenosis.  Patent left carotid endarterectomy site with no evidence of hyperplasia or restenosis.     Compared to the previous exam:  No significant change in comparison to the last exam on 03/09/2014.      Assessment: [name] Love is a 81 y.o. female who is s/p left carotid endarterectomy in the early 1990's in [place], [place]. Dr. Arbie Cookey has been monitoring her extracranial carotid arteries since about 2003.  Patient reports possible mild stroke before her left CEA, but denies stroke or TIA symptoms since then. Today's carotid Duplex suggests  <40% right ICA stenosis and a patent left carotid endarterectomy site with no evidence of hyperplasia or restenosis. No significant change in comparison to the last exam on 03/09/2014.   I advised pt to see her PCP ASAP re her elevated blood pressure; she states  her blood pressure is checked weekly by personnel at her senior apartments, and has not been this high.   Plan: Follow-up in 1 year with Carotid Duplex scan.   I discussed in depth with the patient the nature of atherosclerosis, and emphasized the importance of maximal medical management including strict control of blood pressure, blood glucose, and lipid levels, obtaining regular exercise, and continued cessation of smoking.  The patient is aware that without maximal medical management the underlying atherosclerotic disease process will progress, limiting the benefit of any  interventions. The patient was given information about stroke prevention and what symptoms should prompt the patient to seek immediate medical care. Thank you for allowing [place] to participate in this patient's care.  Charisse March, RN, MSN, FNP-C [facility] [place] Office: [phone]  Clinic Physician: Darrick Penna  03/28/2015 3:41 PM

## 2015-03-28 NOTE — Patient Instructions (Signed)
Stroke Prevention Some medical conditions and behaviors are associated with an increased chance of having a stroke. You may prevent a stroke by making healthy choices and managing medical conditions. HOW CAN I REDUCE MY RISK OF HAVING A STROKE?   Stay physically active. Get at least 30 minutes of activity on most or all days.  Do not smoke. It may also be helpful to avoid exposure to secondhand smoke.  Limit alcohol use. Moderate alcohol use is considered to be:  No more than 2 drinks per day for men.  No more than 1 drink per day for nonpregnant women.  Eat healthy foods. This involves:  Eating 5 or more servings of fruits and vegetables a day.  Making dietary changes that address high blood pressure (hypertension), high cholesterol, diabetes, or obesity.  Manage your cholesterol levels.  Making food choices that are high in fiber and low in saturated fat, trans fat, and cholesterol may control cholesterol levels.  Take any prescribed medicines to control cholesterol as directed by your health care provider.  Manage your diabetes.  Controlling your carbohydrate and sugar intake is recommended to manage diabetes.  Take any prescribed medicines to control diabetes as directed by your health care provider.  Control your hypertension.  Making food choices that are low in salt (sodium), saturated fat, trans fat, and cholesterol is recommended to manage hypertension.  Ask your health care provider if you need treatment to lower your blood pressure. Take any prescribed medicines to control hypertension as directed by your health care provider.  If you are 18-39 years of age, have your blood pressure checked every 3-5 years. If you are 40 years of age or older, have your blood pressure checked every year.  Maintain a healthy weight.  Reducing calorie intake and making food choices that are low in sodium, saturated fat, trans fat, and cholesterol are recommended to manage  weight.  Stop drug abuse.  Avoid taking birth control pills.  Talk to your health care provider about the risks of taking birth control pills if you are over 35 years old, smoke, get migraines, or have ever had a blood clot.  Get evaluated for sleep disorders (sleep apnea).  Talk to your health care provider about getting a sleep evaluation if you snore a lot or have excessive sleepiness.  Take medicines only as directed by your health care provider.  For some people, aspirin or blood thinners (anticoagulants) are helpful in reducing the risk of forming abnormal blood clots that can lead to stroke. If you have the irregular heart rhythm of atrial fibrillation, you should be on a blood thinner unless there is a good reason you cannot take them.  Understand all your medicine instructions.  Make sure that other conditions (such as anemia or atherosclerosis) are addressed. SEEK IMMEDIATE MEDICAL CARE IF:   You have sudden weakness or numbness of the face, arm, or leg, especially on one side of the body.  Your face or eyelid droops to one side.  You have sudden confusion.  You have trouble speaking (aphasia) or understanding.  You have sudden trouble seeing in one or both eyes.  You have sudden trouble walking.  You have dizziness.  You have a loss of balance or coordination.  You have a sudden, severe headache with no known cause.  You have new chest pain or an irregular heartbeat. Any of these symptoms may represent a serious problem that is an emergency. Do not wait to see if the symptoms will   go away. Get medical help at once. Call your local emergency services (911 in [place] Do not drive yourself to the hospital.   This information is not intended to replace advice given to you by your health care provider. Make sure you discuss any questions you have with your health care provider.   Document Released: 07/03/2004 Document Revised: 06/16/2014 Document Reviewed:  11/26/2012 Elsevier Interactive Patient Education 2016 Elsevier Inc.  

## 2015-04-03 NOTE — Addendum Note (Signed)
Addended by: Adria DillELDRIDGE-LEWIS, Cesia Orf L on: 04/03/2015 01:26 PM   Modules accepted: Orders

## 2015-08-16 DIAGNOSIS — Z79899 Other long term (current) drug therapy: Secondary | ICD-10-CM | POA: Diagnosis not present

## 2015-08-16 DIAGNOSIS — Z0000 Encounter for general adult medical examination without abnormal findings: Secondary | ICD-10-CM | POA: Diagnosis not present

## 2015-08-16 DIAGNOSIS — I129 Hypertensive chronic kidney disease with stage 1 through stage 4 chronic kidney disease, or unspecified chronic kidney disease: Secondary | ICD-10-CM | POA: Diagnosis not present

## 2015-08-16 DIAGNOSIS — E7800 Pure hypercholesterolemia, unspecified: Secondary | ICD-10-CM | POA: Diagnosis not present

## 2015-08-16 DIAGNOSIS — Z8679 Personal history of other diseases of the circulatory system: Secondary | ICD-10-CM | POA: Diagnosis not present

## 2015-08-16 DIAGNOSIS — Z1389 Encounter for screening for other disorder: Secondary | ICD-10-CM | POA: Diagnosis not present

## 2015-08-16 DIAGNOSIS — E039 Hypothyroidism, unspecified: Secondary | ICD-10-CM | POA: Diagnosis not present

## 2015-08-16 DIAGNOSIS — N183 Chronic kidney disease, stage 3 (moderate): Secondary | ICD-10-CM | POA: Diagnosis not present

## 2015-11-22 DIAGNOSIS — H903 Sensorineural hearing loss, bilateral: Secondary | ICD-10-CM | POA: Diagnosis not present

## 2016-01-31 DIAGNOSIS — R42 Dizziness and giddiness: Secondary | ICD-10-CM | POA: Diagnosis not present

## 2016-01-31 DIAGNOSIS — N183 Chronic kidney disease, stage 3 (moderate): Secondary | ICD-10-CM | POA: Diagnosis not present

## 2016-01-31 DIAGNOSIS — M545 Low back pain: Secondary | ICD-10-CM | POA: Diagnosis not present

## 2016-01-31 DIAGNOSIS — R197 Diarrhea, unspecified: Secondary | ICD-10-CM | POA: Diagnosis not present

## 2016-01-31 DIAGNOSIS — I129 Hypertensive chronic kidney disease with stage 1 through stage 4 chronic kidney disease, or unspecified chronic kidney disease: Secondary | ICD-10-CM | POA: Diagnosis not present

## 2016-02-21 DIAGNOSIS — Z23 Encounter for immunization: Secondary | ICD-10-CM | POA: Diagnosis not present

## 2016-02-21 DIAGNOSIS — R300 Dysuria: Secondary | ICD-10-CM | POA: Diagnosis not present

## 2016-02-21 DIAGNOSIS — I129 Hypertensive chronic kidney disease with stage 1 through stage 4 chronic kidney disease, or unspecified chronic kidney disease: Secondary | ICD-10-CM | POA: Diagnosis not present

## 2016-02-21 DIAGNOSIS — N183 Chronic kidney disease, stage 3 (moderate): Secondary | ICD-10-CM | POA: Diagnosis not present

## 2016-02-21 DIAGNOSIS — N952 Postmenopausal atrophic vaginitis: Secondary | ICD-10-CM | POA: Diagnosis not present

## 2016-04-03 ENCOUNTER — Encounter: Payer: Self-pay | Admitting: Family

## 2016-04-08 ENCOUNTER — Ambulatory Visit (INDEPENDENT_AMBULATORY_CARE_PROVIDER_SITE_OTHER): Payer: Medicare Other | Admitting: Family

## 2016-04-08 ENCOUNTER — Encounter: Payer: Self-pay | Admitting: Family

## 2016-04-08 ENCOUNTER — Ambulatory Visit (HOSPITAL_COMMUNITY)
Admission: RE | Admit: 2016-04-08 | Discharge: 2016-04-08 | Disposition: A | Discharge status: Home / Self Care | Payer: Medicare Other | Source: Ambulatory Visit | Attending: Family | Admitting: Family

## 2016-04-08 VITALS — BP 168/82 | HR 65 | Temp 98.2°F | Resp 16 | Ht 65.5 in | Wt 157.2 lb

## 2016-04-08 DIAGNOSIS — I6522 Occlusion and stenosis of left carotid artery: Secondary | ICD-10-CM

## 2016-04-08 DIAGNOSIS — Z48812 Encounter for surgical aftercare following surgery on the circulatory system: Secondary | ICD-10-CM | POA: Diagnosis not present

## 2016-04-08 DIAGNOSIS — Z9889 Other specified postprocedural states: Secondary | ICD-10-CM | POA: Diagnosis not present

## 2016-04-08 NOTE — Patient Instructions (Signed)
Stroke Prevention Some medical conditions and behaviors are associated with an increased chance of having a stroke. You may prevent a stroke by making healthy choices and managing medical conditions. HOW CAN I REDUCE MY RISK OF HAVING A STROKE?   Stay physically active. Get at least 30 minutes of activity on most or all days.  Do not smoke. It may also be helpful to avoid exposure to secondhand smoke.  Limit alcohol use. Moderate alcohol use is considered to be:  No more than 2 drinks per day for men.  No more than 1 drink per day for nonpregnant women.  Eat healthy foods. This involves:  Eating 5 or more servings of fruits and vegetables a day.  Making dietary changes that address high blood pressure (hypertension), high cholesterol, diabetes, or obesity.  Manage your cholesterol levels.  Making food choices that are high in fiber and low in saturated fat, trans fat, and cholesterol may control cholesterol levels.  Take any prescribed medicines to control cholesterol as directed by your health care provider.  Manage your diabetes.  Controlling your carbohydrate and sugar intake is recommended to manage diabetes.  Take any prescribed medicines to control diabetes as directed by your health care provider.  Control your hypertension.  Making food choices that are low in salt (sodium), saturated fat, trans fat, and cholesterol is recommended to manage hypertension.  Ask your health care provider if you need treatment to lower your blood pressure. Take any prescribed medicines to control hypertension as directed by your health care provider.  If you are 18-39 years of age, have your blood pressure checked every 3-5 years. If you are 40 years of age or older, have your blood pressure checked every year.  Maintain a healthy weight.  Reducing calorie intake and making food choices that are low in sodium, saturated fat, trans fat, and cholesterol are recommended to manage  weight.  Stop drug abuse.  Avoid taking birth control pills.  Talk to your health care provider about the risks of taking birth control pills if you are over 35 years old, smoke, get migraines, or have ever had a blood clot.  Get evaluated for sleep disorders (sleep apnea).  Talk to your health care provider about getting a sleep evaluation if you snore a lot or have excessive sleepiness.  Take medicines only as directed by your health care provider.  For some people, aspirin or blood thinners (anticoagulants) are helpful in reducing the risk of forming abnormal blood clots that can lead to stroke. If you have the irregular heart rhythm of atrial fibrillation, you should be on a blood thinner unless there is a good reason you cannot take them.  Understand all your medicine instructions.  Make sure that other conditions (such as anemia or atherosclerosis) are addressed. SEEK IMMEDIATE MEDICAL CARE IF:   You have sudden weakness or numbness of the face, arm, or leg, especially on one side of the body.  Your face or eyelid droops to one side.  You have sudden confusion.  You have trouble speaking (aphasia) or understanding.  You have sudden trouble seeing in one or both eyes.  You have sudden trouble walking.  You have dizziness.  You have a loss of balance or coordination.  You have a sudden, severe headache with no known cause.  You have new chest pain or an irregular heartbeat. Any of these symptoms may represent a serious problem that is an emergency. Do not wait to see if the symptoms will   go away. Get medical help at once. Call your local emergency services (911 in [place] Do not drive yourself to the hospital.   This information is not intended to replace advice given to you by your health care provider. Make sure you discuss any questions you have with your health care provider.   Document Released: 07/03/2004 Document Revised: 06/16/2014 Document Reviewed:  11/26/2012 Elsevier Interactive Patient Education 2016 Elsevier Inc.  

## 2016-04-08 NOTE — Progress Notes (Signed)
Chief Complaint: Follow up Extracranial Carotid Artery Stenosis   History of Present Illness  [name] Love is a 82 y.o. female who had undergone left carotid endarterectomy in the early 1990's in [place], [place] Dr. Arbie CookeyEarly has been monitoring her extracranial carotid arteries since about 2003.  She returns today for follow up. Patient reports possible mild stroke before her left CEA, but denies stroke or TIA symptoms since then. The patient denies a history of amaurosis fugax or monocular blindness, unilateral facial drooping, hemiplegia, or receptive or expressive aphasia.  She denies claudication symptoms with walking, denies non-healing ulcers, denies dyspnea, denies chest pain.  Her PCP is Dr. Merlene LaughterHal Stoneking.   Gout was diagnosed in the base of her left great toe, states this is not bothering her much.   Pt Diabetic: No  Pt smoker: non-smoker   Pt meds include:  Statin : Yes  Betablocker: Yes  ASA: Yes  Other anticoagulants/antiplatelets: none     Past Medical History:  Diagnosis Date  . Atrial fibrillation (HCC)   . Carotid artery occlusion   . Constipation 02/04/2008  . Diverticulosis of colon   . Dizziness   . Esophageal reflux   . Hyperlipidemia   . Hypertension   . Thyroid disease   . Urticaria   . Varicose veins     Social History Social History  Substance Use Topics  . Smoking status: Never Smoker  . Smokeless tobacco: Never Used  . Alcohol use No    Family History Family History  Problem Relation Age of Onset  . Heart attack Father   . Heart disease Father     After age 80  . Heart attack Brother   . Heart attack Brother   . Breast cancer Mother   . Hypertension Mother   . Varicose Veins Mother   . Cancer Sister     ? Breast    Surgical History Past Surgical History:  Procedure Laterality Date  . APPENDECTOMY    . CAROTID ENDARTERECTOMY Left 1990    No Known Allergies  Current Outpatient Prescriptions  Medication Sig  Dispense Refill  . amlodipine-benazepril (LOTREL) 2.5-10 MG per capsule Take 1 capsule by mouth daily.    Marland Kitchen. aspirin 81 MG tablet Take 81 mg by mouth daily.    . Calcium Carb-Cholecalciferol (OYSTER SHELL CALCIUM + D PO) Take 500 mg by mouth 2 (two) times daily.     Marland Kitchen. gabapentin (NEURONTIN) 100 MG capsule Take 100 mg by mouth 2 (two) times daily. Per patient taking only as needed    . Levothyroxine Sodium (LEVOTHROID PO) Take by mouth.    . metoprolol succinate (TOPROL-XL) 25 MG 24 hr tablet Take 25 mg by mouth daily. Takes 2 tablets daily.     . Multiple Vitamin (MULTIVITAMINS PO) Take by mouth.      . simvastatin (ZOCOR) 40 MG tablet Take 40 mg by mouth at bedtime.       No current facility-administered medications for this visit.     Review of Systems : See HPI for pertinent positives and negatives.  Physical Examination  Vitals:   04/08/16 1458 04/08/16 1459  BP: (!) 149/80 (!) 168/82  Pulse: 65   Resp: 16   Temp: 98.2 F (36.8 C)   TempSrc: Oral   SpO2: 98%   Weight: 157 lb 3.2 oz (71.3 kg)   Height: 5' 5.5" (1.664 m)    Body mass index is 25.76 kg/m.  General: WDWN female in NAD  GAIT:  normal  Eyes: PERRLA  Pulmonary: Respirations are non labored, CTAB, good air movement in all fields.  Cardiac: regular rhythm, + systolic murmur  VASCULAR EXAM  Carotid Bruits  Left  Right    Transmitted cardiac murmur Transmitted cardiac murmur   Aorta is mildly palpable.  Bilateral radial pulses are 2+ palpable.   LE Pulses  LEFT  RIGHT   FEMORAL  palpable  palpable   POPLITEAL  not palpable  not palpable   POSTERIOR TIBIAL  not palpable  not palpable   DORSALIS PEDIS  ANTERIOR TIBIAL  palpable  palpable    Gastrointestinal: soft, nontender, BS WNL, no r/g, no palpated masses.  Musculoskeletal: Negative muscle atrophy/wasting. M/S 5/5 throughout, Extremities without ischemic changes.  Neurologic: A&O X 3; Appropriate Affect ; SENSATION  ;normal;  Speech is normal  CN 2-12 intact except use of hearing aids in both ears, Pain and light touch intact in extremities, Motor exam as listed above.     Assessment: [name] MirzaMary J Stankovich is a 82 y.o. female who is s/pleft carotid endarterectomy in the early 1990's [place] Dr. Arbie CookeyEarly has been monitoring her extracranial carotid arteries since about 2003.  Patient reports possible mild stroke before her left CEA, but denies stroke or TIA symptoms since then. Pt states her blood pressure is checked weekly by personnel at her senior apartments, and is not this high.   DATA Today's carotid Duplex suggests  <40% right ICA stenosis and a patent left carotid endarterectomy site with no evidence of hyperplasia or restenosis. Bilateral vertebral arteries are antegrade.  Bilateral subclavian arteries are multiphasic.  No significant change in comparison to the last exam on 03/28/15.     Plan: Follow-up in 1 year with Carotid Duplex scan.    I discussed in depth with the patient the nature of atherosclerosis, and emphasized the importance of maximal medical management including strict control of blood pressure, blood glucose, and lipid levels, obtaining regular exercise, and continued cessation of smoking.  The patient is aware that without maximal medical management the underlying atherosclerotic disease process will progress, limiting the benefit of any interventions. The patient was given information about stroke prevention and what symptoms should prompt the patient to seek immediate medical care. Thank you for allowing us to participate in this patient's care.  Charisse MarchSuzanne Nicholai Willette, RN, MSN, FNP-C [facility] Office: [phone]  Clinic Physician: Early  04/08/16 3:26 PM

## 2016-04-29 NOTE — Addendum Note (Signed)
Addended by: Burton ApleyPETTY, Terecia Plaut A on: 04/29/2016 03:23 PM   Modules accepted: Orders

## 2016-06-06 DIAGNOSIS — L814 Other melanin hyperpigmentation: Secondary | ICD-10-CM | POA: Diagnosis not present

## 2016-06-06 DIAGNOSIS — L821 Other seborrheic keratosis: Secondary | ICD-10-CM | POA: Diagnosis not present

## 2016-06-06 DIAGNOSIS — D1801 Hemangioma of skin and subcutaneous tissue: Secondary | ICD-10-CM | POA: Diagnosis not present

## 2016-06-18 DIAGNOSIS — Z961 Presence of intraocular lens: Secondary | ICD-10-CM | POA: Diagnosis not present

## 2016-08-21 DIAGNOSIS — N183 Chronic kidney disease, stage 3 (moderate): Secondary | ICD-10-CM | POA: Diagnosis not present

## 2016-08-21 DIAGNOSIS — I129 Hypertensive chronic kidney disease with stage 1 through stage 4 chronic kidney disease, or unspecified chronic kidney disease: Secondary | ICD-10-CM | POA: Diagnosis not present

## 2016-08-21 DIAGNOSIS — Z0000 Encounter for general adult medical examination without abnormal findings: Secondary | ICD-10-CM | POA: Diagnosis not present

## 2016-08-21 DIAGNOSIS — E039 Hypothyroidism, unspecified: Secondary | ICD-10-CM | POA: Diagnosis not present

## 2016-11-06 DIAGNOSIS — M7062 Trochanteric bursitis, left hip: Secondary | ICD-10-CM | POA: Diagnosis not present

## 2016-11-06 DIAGNOSIS — R42 Dizziness and giddiness: Secondary | ICD-10-CM | POA: Diagnosis not present

## 2016-11-10 DIAGNOSIS — M25562 Pain in left knee: Secondary | ICD-10-CM | POA: Diagnosis not present

## 2016-11-10 DIAGNOSIS — M25552 Pain in left hip: Secondary | ICD-10-CM | POA: Diagnosis not present

## 2016-12-01 DIAGNOSIS — M25552 Pain in left hip: Secondary | ICD-10-CM | POA: Diagnosis not present

## 2016-12-08 DIAGNOSIS — M1712 Unilateral primary osteoarthritis, left knee: Secondary | ICD-10-CM | POA: Diagnosis not present

## 2016-12-08 DIAGNOSIS — M6281 Muscle weakness (generalized): Secondary | ICD-10-CM | POA: Diagnosis not present

## 2016-12-11 DIAGNOSIS — N183 Chronic kidney disease, stage 3 (moderate): Secondary | ICD-10-CM | POA: Diagnosis not present

## 2016-12-11 DIAGNOSIS — I129 Hypertensive chronic kidney disease with stage 1 through stage 4 chronic kidney disease, or unspecified chronic kidney disease: Secondary | ICD-10-CM | POA: Diagnosis not present

## 2016-12-11 DIAGNOSIS — H8110 Benign paroxysmal vertigo, unspecified ear: Secondary | ICD-10-CM | POA: Diagnosis not present

## 2016-12-29 DIAGNOSIS — M6281 Muscle weakness (generalized): Secondary | ICD-10-CM | POA: Diagnosis not present

## 2016-12-29 DIAGNOSIS — M1712 Unilateral primary osteoarthritis, left knee: Secondary | ICD-10-CM | POA: Diagnosis not present

## 2017-01-14 ENCOUNTER — Ambulatory Visit: Payer: Medicare Other | Attending: Geriatric Medicine

## 2017-01-14 DIAGNOSIS — R42 Dizziness and giddiness: Secondary | ICD-10-CM | POA: Insufficient documentation

## 2017-01-14 DIAGNOSIS — H8112 Benign paroxysmal vertigo, left ear: Secondary | ICD-10-CM | POA: Insufficient documentation

## 2017-01-14 DIAGNOSIS — R2689 Other abnormalities of gait and mobility: Secondary | ICD-10-CM | POA: Insufficient documentation

## 2017-01-14 NOTE — Therapy (Signed)
[facility] [address] [place], [place], [address] Phone: [phone]   Fax:  [phone]  Physical Therapy Evaluation  Patient Details  Name: [name] Love MRN: [id number] Date of Birth: [birth year] Referring Provider: Dr. Pete Glatter  Encounter Date: 01/14/2017      PT End of Session - 01/14/17 1350    Visit Number 1   Number of Visits 9   Date for PT Re-Evaluation 02/13/17   Authorization Type BCBS Medicare: G-CODE AND PN EVERY 10TH VISIT. No auth required.   PT Start Time 1101   PT Stop Time 1141   PT Time Calculation (min) 40 min   Activity Tolerance Patient tolerated treatment well   Behavior During Therapy WFL for tasks assessed/performed      Past Medical History:  Diagnosis Date  . Atrial fibrillation (HCC)   . Carotid artery occlusion   . Constipation 02/04/2008  . Diverticulosis of colon   . Dizziness   . Esophageal reflux   . Hyperlipidemia   . Hypertension   . Thyroid disease   . Urticaria   . Varicose veins     Past Surgical History:  Procedure Laterality Date  . APPENDECTOMY    . CAROTID ENDARTERECTOMY Left 1990    There were no vitals filed for this visit.       Subjective Assessment - 01/14/17 1106    Subjective Dizziness began approx. 1 year ago, pt denieds falls, accidents or illnesses prior to dizziness onset. Pt reported dizziness occurs when she stands up after she eats, or when she changes direction, and occasionally during supine to sit. Pt reports dizziness occurs when rolling in bed. Pt describes the dizziness as lightheadedness. Pt reports dizziness began as spinning sensation. Pt denied HA, N/T, weakness, or loss of hearing with dizziness.    Patient is accompained by: Family member  Kathleen-dtr   Pertinent History HOH-hearing aides, HTN, HLD, hypothyroidism, CAD-s/p L carotid endarterectomy and 40-59% stenosis on R, a-fib, hx of MI, vertigo, osteopenia, gout   Patient  Stated Goals "To not have dizzy spells."   Currently in Pain? No/denies            [facility] PT Assessment - 01/14/17 1110      Assessment   Medical Diagnosis BPPV   Referring Provider Dr. Pete Glatter   Onset Date/Surgical Date 01/15/16  pt reports severe vertigo approx. 2-3 weeks ago   Hand Dominance Right   Prior Therapy none     Precautions   Precautions Fall   Precaution Comments as pt must utilize [facility] to maintain balance.     Restrictions   Weight Bearing Restrictions No     Balance Screen   Has the patient fallen in the past 6 months No   Has the patient had a decrease in activity level because of a fear of falling?  Yes   Is the patient reluctant to leave their home because of a fear of falling?  No     Home Environment   Living Environment --  Apartment at Independent living at [facility] - single point;Grab bars - tub/shower;Grab bars - toilet     Prior Function   Level of Independence Independent with community mobility with device   Vocation Retired   Leisure Go to activities at the home.     Cognition   Overall Cognitive Status Within Functional Limits for tasks assessed     Observation/Other Assessments   Focus on Therapeutic  Outcomes (FOTO)  DHI: 22% indicates pt perceives dizziness has mild impact on functional abilities.      Ambulation/Gait   Ambulation/Gait Yes   Ambulation/Gait Assistance 5: Supervision   Ambulation/Gait Assistance Details Pt reported intermittent unsteadiness during turns. Pt amb. in guarded manner.    Ambulation Distance (Feet) 75 Feet   Assistive device Straight cane   Gait Pattern Step-through pattern;Decreased stride length;Decreased trunk rotation   Ambulation Surface Level;Indoor   Gait velocity 2.57ft/sec. with [facility]            Vestibular Assessment - 01/14/17 1117      Symptom Behavior   Type of Dizziness Lightheadedness  was spinning for 2-3 weeks   Frequency of Dizziness Daily   Duration  of Dizziness < 30 seconds   Aggravating Factors Supine to sit;Sit to stand;Rolling to right;Rolling to left   Relieving Factors Rest  and breathe deeply     Occulomotor Exam   Occulomotor Alignment Normal   Spontaneous Absent   Gaze-induced Absent   Smooth Pursuits Intact   Saccades Intact   Comment Pt denied dizziness.     Vestibulo-Occular Reflex   VOR 1 Head Only (x 1 viewing) WNL, and no dizziness reported.      Positional Testing   Dix-Hallpike Dix-Hallpike Right;Dix-Hallpike Left   Horizontal Canal Testing Horizontal Canal Right;Horizontal Canal Left     Dix-Hallpike Right   Dix-Hallpike Right Duration none   Dix-Hallpike Right Symptoms No nystagmus     Dix-Hallpike Left   Dix-Hallpike Left Duration <10 seconds.   Dix-Hallpike Left Symptoms Upbeat, left rotatory nystagmus     Horizontal Canal Right   Horizontal Canal Right Duration none   Horizontal Canal Right Symptoms Normal     Horizontal Canal Left   Horizontal Canal Left Duration none   Horizontal Canal Left Symptoms Normal        Objective measurements completed on examination: See above findings.           Vestibular Treatment/Exercise - 01/14/17 1135      Vestibular Treatment/Exercise   Vestibular Treatment Provided Canalith Repositioning   Canalith Repositioning Epley Manuever Left      EPLEY MANUEVER LEFT   Number of Reps  1   Overall Response  Symptoms Resolved    RESPONSE DETAILS LEFT Pt denied dizziness after treatment.                PT Education - 01/14/17 1349    Education provided Yes   Education Details PT discussed exam findings, BPPV treatment, PT POC, duration and frequency. PT discussed monitoring BPPV and treating balance to reduce falls risk.    Person(s) Educated Patient;Child(ren)   Methods Explanation   Comprehension Verbalized understanding          PT Short Term Goals - 01/14/17 1355      PT SHORT TERM GOAL #1   Title same as LTGs           PT  Long Term Goals - 01/14/17 1355      PT LONG TERM GOAL #1   Title Pt will report 0/10 during all positional testing to reduce dizziness. TARGET DATE FOR ALL LTGS: 02/11/17   Status New     PT LONG TERM GOAL #2   Title Pt will be IND in HEP to improve strength, dizziness, and balance.   Status New     PT LONG TERM GOAL #3   Title Perform FGA and write goal as indicated.   Status  New     PT LONG TERM GOAL #4   Title Pt will amb. 1000' over even/uneven surfaces, with LRAD, while performing turns and head turns, at MOD I level to improve functional mobility.    Status New     PT LONG TERM GOAL #5   Title Pt will improve DHI to </=4% to improve quality of life.    Status New                Plan - 02-08-17 1351    Clinical Impression Statement Pt is a pleasant 83y/o female presenting to OPPT neuro for BPPV. Pt's PMH significant for the following: HOH-hearing aides, HTN, HLD, hypothyroidism, CAD-s/p L carotid endarterectomy and 40-59% stenosis on R, a-fib, hx of MI, vertigo, osteopenia, gout. Pt experienced L upbeating torsional nystagmus and 5-6/10 dizziness during L Dix-Hallpike, with all s/s resolving after 1 Epley treatment. The following impairments were noted during exam: dizziness, gait deviations, decr. strength based on gait deviations, imparied balance and postural dysfunction. PT will continue to monitor and treatm BPPV as indicated, but will also focus on balance and improving vestibular input.  Pt's gait speed indicates pt is not able to safely amb. in the community. PT will formally assess balance next session. Pt would benefit from skilled PT to improve safety during functional mobility.    History and Personal Factors relevant to plan of care: pt lives in IND living and must have dtr drive her to appts   Clinical Presentation Stable   Clinical Presentation due to: HOH-hearing aides, HTN, HLD, hypothyroidism, CAD-s/p L carotid endarterectomy and 40-59% stenosis on R, a-fib,  hx of MI, vertigo, osteopenia, gout   Clinical Decision Making Low   Rehab Potential Excellent   Clinical Impairments Affecting Rehab Potential see above   PT Frequency 2x / week   PT Duration 4 weeks   PT Treatment/Interventions ADLs/Self Care Home Management;Biofeedback;Canalith Repostioning;Neuromuscular re-education;Balance training;Therapeutic exercise;Manual techniques;Therapeutic activities;Functional mobility training;Stair training;Gait training;Orthotic Fit/Training;DME Instruction;Patient/family education;Vestibular   PT Next Visit Plan Perform FGA and write goal, initiate balance/vestibular HEP.   Consulted and Agree with Plan of Care Patient;Family member/caregiver   Family Member Consulted dtr: Nicholos Johns      Patient will benefit from skilled therapeutic intervention in order to improve the following deficits and impairments:  Abnormal gait, Dizziness, Postural dysfunction, Impaired flexibility, Decreased mobility, Decreased balance, Decreased strength  Visit Diagnosis: BPPV (benign paroxysmal positional vertigo), left - Plan: PT plan of care cert/re-cert  Dizziness and giddiness - Plan: PT plan of care cert/re-cert  Other abnormalities of gait and mobility - Plan: PT plan of care cert/re-cert      G-Codes - 2017-02-08 1357    Functional Assessment Tool Used (Outpatient Only) DHI: 22%   Functional Limitation Self care   Self Care Current Status (W0981) At least 20 percent but less than 40 percent impaired, limited or restricted   Self Care Goal Status (X9147) At least 1 percent but less than 20 percent impaired, limited or restricted       Problem List Patient Active Problem List   Diagnosis Date Noted  . Carotid stenosis 03/09/2014  . Aftercare following surgery of the circulatory system 03/09/2014  . Aftercare following surgery of the circulatory system, NEC 03/08/2013  . Occlusion and stenosis of carotid artery without mention of cerebral infarction 02/04/2012     Jodi Criscuolo L 02-08-17, 1:58 PM  [facility] [address] [place], [place], [address] Phone: [phone]   Fax:  [phone]  Name: [name] Love MRN: [id number] Date of Birth: 04/29/1923  Zerita BoersJennifer Vashon Riordan, PT,DPT 01/14/17 1:59 PM Phone: [phone] Fax: [phone]

## 2017-01-16 ENCOUNTER — Encounter: Payer: Self-pay | Admitting: Physical Therapy

## 2017-01-16 ENCOUNTER — Ambulatory Visit: Payer: Medicare Other | Admitting: Physical Therapy

## 2017-01-16 DIAGNOSIS — R42 Dizziness and giddiness: Secondary | ICD-10-CM

## 2017-01-16 DIAGNOSIS — H8112 Benign paroxysmal vertigo, left ear: Secondary | ICD-10-CM | POA: Diagnosis not present

## 2017-01-16 NOTE — Therapy (Signed)
[facility] [address] [place], [place], [address] Phone: [phone]   Fax:  [phone]  Physical Therapy Treatment  Patient Details  Name: [name] Love MRN: [id number] Date of Birth: 10/20/1922 Referring Provider: Dr. Pete GlatterStoneking  Encounter Date: 01/16/2017      PT End of Session - 01/16/17 1455    Visit Number 2   Number of Visits 9   Date for PT Re-Evaluation 02/13/17   Authorization Type BCBS Medicare: G-CODE AND PN EVERY 10TH VISIT. No auth required.   PT Start Time 1450   PT Stop Time 1531   PT Time Calculation (min) 41 min   Activity Tolerance Patient tolerated treatment well   Behavior During Therapy WFL for tasks assessed/performed      Past Medical History:  Diagnosis Date  . Atrial fibrillation (HCC)   . Carotid artery occlusion   . Constipation 02/04/2008  . Diverticulosis of colon   . Dizziness   . Esophageal reflux   . Hyperlipidemia   . Hypertension   . Thyroid disease   . Urticaria   . Varicose veins     Past Surgical History:  Procedure Laterality Date  . APPENDECTOMY    . CAROTID ENDARTERECTOMY Left 1990    There were no vitals filed for this visit.      Subjective Assessment - 01/16/17 1454    Subjective Pt reports not having any dizzy spells since Wednesday until today when she got out of the car and felt a little lightheaded; passed quickly.  Car ride was only 8 minutes long.     Patient is accompained by: Family member   Pertinent History HOH-hearing aides, HTN, HLD, hypothyroidism, CAD-s/p L carotid endarterectomy and 40-59% stenosis on R, a-fib, hx of MI, vertigo, osteopenia, gout   Patient Stated Goals "To not have dizzy spells."   Currently in Pain? No/denies             Vestibular Assessment - 01/16/17 1505      Dix-Hallpike Left   Dix-Hallpike Left Duration <10 seconds.   Dix-Hallpike Left Symptoms Upbeat, left rotatory nystagmus;Other (comment)  Repeated  blinking     Orthostatics   BP supine (x 5 minutes) 139/81   HR supine (x 5 minutes) 74   BP sitting 110/82   HR sitting 81   BP standing (after 1 minute) 113/66   HR standing (after 1 minute) 78   BP standing (after 3 minutes) 89/57   HR standing (after 3 minutes) 73                  Vestibular Treatment/Exercise - 01/16/17 1541      Vestibular Treatment/Exercise   Vestibular Treatment Provided Canalith Repositioning   Canalith Repositioning Epley Manuever Left      EPLEY MANUEVER LEFT   Number of Reps  1   Overall Response  Symptoms Resolved    RESPONSE DETAILS LEFT Pt reported no sx when reassessed with L [name] Love Betterix Hallpike               PT Education - 01/16/17 1525    Education provided Yes   Education Details PT discussed ways to manage orthostatic hypotension when getting out of bed and when standing/walking and reporting findings to MD.   Person(s) Educated Patient   Methods Explanation;Demonstration;Verbal cues   Comprehension Verbalized understanding;Returned demonstration          PT Short Term Goals - 01/14/17 1355      PT  SHORT TERM GOAL #1   Title same as LTGs           PT Long Term Goals - 01/14/17 1355      PT LONG TERM GOAL #1   Title Pt will report 0/10 during all positional testing to reduce dizziness. TARGET DATE FOR ALL LTGS: 02/11/17   Status New     PT LONG TERM GOAL #2   Title Pt will be IND in HEP to improve strength, dizziness, and balance.   Status New     PT LONG TERM GOAL #3   Title Perform FGA and write goal as indicated.   Status New     PT LONG TERM GOAL #4   Title Pt will amb. 1000' over even/uneven surfaces, with LRAD, while performing turns and head turns, at MOD I level to improve functional mobility.    Status New     PT LONG TERM GOAL #5   Title Pt will improve DHI to </=4% to improve quality of life.    Status New               Plan - 01/16/17 1543    Clinical Impression Statement Today's  session focused on assessing the effectiveness of previous treatment for dizziness and balance deficits. Patient continues to experience vertigo and a positive for upbeat, L rotary nystagmus with L Dix Hallpike test indicating ongoing L posterior canal canalithiasis.  Symptoms were resolved with one canalith repositioning maneuver. Patient also has positive signs for orthostatic hypotension and was advised to discuss these findings with her doctor. PT discussed ways for patient to ensure her safety if dizziness returns. Patient will benefit from continued therapy to address these deficits and progress toward her goals.   Rehab Potential Excellent   Clinical Impairments Affecting Rehab Potential see above   PT Frequency 2x / week   PT Duration 4 weeks   PT Treatment/Interventions ADLs/Self Care Home Management;Biofeedback;Canalith Repostioning;Neuromuscular re-education;Balance training;Therapeutic exercise;Manual techniques;Therapeutic activities;Functional mobility training;Stair training;Gait training;Orthotic Fit/Training;DME Instruction;Patient/family education;Vestibular   PT Next Visit Plan re-test and treat LBPPV as indicated; Perform FGA and write goal, initiate balance/vestibular HEP.    Consulted and Agree with Plan of Care Patient;Family member/caregiver   Family Member Consulted dtr: [name] Love      Patient will benefit from skilled therapeutic intervention in order to improve the following deficits and impairments:  Abnormal gait, Dizziness, Postural dysfunction, Impaired flexibility, Decreased mobility, Decreased balance, Decreased strength  Visit Diagnosis: BPPV (benign paroxysmal positional vertigo), left  Dizziness and giddiness     Problem List Patient Active Problem List   Diagnosis Date Noted  . Carotid stenosis 03/09/2014  . Aftercare following surgery of the circulatory system 03/09/2014  . Aftercare following surgery of the circulatory system, NEC 03/08/2013  .  Occlusion and stenosis of carotid artery without mention of cerebral infarction 02/04/2012    [name] Love, SPT 01/16/2017, 5:02 PM  [facility] [address] [place], [place], [address] Phone: [phone]   Fax:  [phone]  Name: [name] Love MRN: [id number] Date of Birth: Feb 07, 1923

## 2017-01-19 ENCOUNTER — Ambulatory Visit: Payer: Medicare Other | Admitting: Physical Therapy

## 2017-01-19 ENCOUNTER — Encounter: Payer: Self-pay | Admitting: Physical Therapy

## 2017-01-19 DIAGNOSIS — R2689 Other abnormalities of gait and mobility: Secondary | ICD-10-CM

## 2017-01-19 DIAGNOSIS — H8112 Benign paroxysmal vertigo, left ear: Secondary | ICD-10-CM

## 2017-01-19 DIAGNOSIS — R42 Dizziness and giddiness: Secondary | ICD-10-CM

## 2017-01-19 NOTE — Therapy (Signed)
[facility] [address] [place], [place], [address] Phone: [phone]   Fax:  [phone]  Physical Therapy Treatment  Patient Details  Name: [name] Love MRN: [id number] Date of Birth: Jun 26, 1922 Referring Provider: Dr. Pete Glatter  Encounter Date: 01/19/2017      PT End of Session - 01/19/17 1301    Visit Number 3   Number of Visits 9   Date for PT Re-Evaluation 02/13/17   Authorization Type BCBS Medicare: G-CODE AND PN EVERY 10TH VISIT. No auth required.   PT Start Time 1024   PT Stop Time 1109   PT Time Calculation (min) 45 min   Activity Tolerance Patient tolerated treatment well   Behavior During Therapy WFL for tasks assessed/performed      Past Medical History:  Diagnosis Date  . Atrial fibrillation (HCC)   . Carotid artery occlusion   . Constipation 02/04/2008  . Diverticulosis of colon   . Dizziness   . Esophageal reflux   . Hyperlipidemia   . Hypertension   . Thyroid disease   . Urticaria   . Varicose veins     Past Surgical History:  Procedure Laterality Date  . APPENDECTOMY    . CAROTID ENDARTERECTOMY Left 1990    There were no vitals filed for this visit.      Subjective Assessment - 01/19/17 1027    Subjective No issues over the weekend; was fatigued after Friday's visit.  No dizziness but lightheadedness present intermittently.  No falls.   Patient is accompained by: Family member   Pertinent History HOH-hearing aides, HTN, HLD, hypothyroidism, CAD-s/p L carotid endarterectomy and 40-59% stenosis on R, a-fib, hx of MI, vertigo, osteopenia, gout   Patient Stated Goals "To not have dizzy spells."   Currently in Pain? No/denies            [facility] PT Assessment - 01/19/17 1028      Functional Gait  Assessment   Gait assessed  Yes   Gait Level Surface Walks 20 ft, slow speed, abnormal gait pattern, evidence for imbalance or deviates 10-15 in outside of the 12 in walkway width.  Requires more than 7 sec to ambulate 20 ft.   Change in Gait Speed Able to smoothly change walking speed without loss of balance or gait deviation. Deviate no more than 6 in outside of the 12 in walkway width.   Gait with Horizontal Head Turns Performs head turns smoothly with slight change in gait velocity (eg, minor disruption to smooth gait path), deviates 6-10 in outside 12 in walkway width, or uses an assistive device.   Gait with Vertical Head Turns Performs head turns with no change in gait. Deviates no more than 6 in outside 12 in walkway width.   Gait and Pivot Turn Pivot turns safely in greater than 3 sec and stops with no loss of balance, or pivot turns safely within 3 sec and stops with mild imbalance, requires small steps to catch balance.   Step Over Obstacle Is able to step over one shoe box (4.5 in total height) but must slow down and adjust steps to clear box safely. May require verbal cueing.   Gait with Narrow Base of Support Ambulates less than 4 steps heel to toe or cannot perform without assistance.  must use SPC   Gait with Eyes Closed Walks 20 ft, slow speed, abnormal gait pattern, evidence for imbalance, deviates 10-15 in outside 12 in walkway width. Requires more than 9 sec to  ambulate 20 ft.   Ambulating Backwards Walks 20 ft, uses assistive device, slower speed, mild gait deviations, deviates 6-10 in outside 12 in walkway width.   Steps Alternating feet, must use rail.   Total Score 17   FGA comment: 17/30                          Balance Exercises - 01/19/17 1045      Balance Exercises: Standing   Standing Eyes Closed Narrow base of support (BOS);Head turns;Solid surface;3 reps;10 secs   SLS Eyes open;Solid surface;Upper extremity support 2;2 reps;10 secs   Tandem Gait Forward;Upper extremity support;4 reps     OTAGO PROGRAM   Hip ABductor 10 reps  bilat      Tandem Walking    Hold on to counter top with one hand!! Walk with each foot  directly in front of other, heel of one foot touching toes of other foot with each step. Both feet straight ahead.  Perform 4 laps at counter top.  Hip (Side)    Stand with support. Inhale, then exhale while extending leg directly to side, leading with heel and keeping upper body still.  Slowly return to starting position. Repeat _10___ times each leg. Do _2___ sets per session. Do _2___ sessions per day.  Single Leg - Eyes Open    Holding support, lift right leg while maintaining balance over other leg. . Hold__10__ seconds keeping hips level. Repeat _2___ times per leg per session. Do __2__ sessions per day.  Balance: Eyes Closed - Bilateral (Varied Surfaces)    Stand in a corner with chair in front of you, place your feet together and close eyes. Maintain balance 10 seconds. Repeat 2 times per set. Do 2 sessions per day      PT Education - 01/19/17 1301    Education provided Yes   Education Details clinical findings with FGA, HEP   Person(s) Educated Patient;Child(ren)   Methods Explanation;Demonstration;Handout   Comprehension Verbalized understanding;Returned demonstration          PT Short Term Goals - 01/14/17 1355      PT SHORT TERM GOAL #1   Title same as LTGs           PT Long Term Goals - 01/19/17 1303      PT LONG TERM GOAL #1   Title Pt will report 0/10 during all positional testing to reduce dizziness. TARGET DATE FOR ALL LTGS: 02/11/17   Status New     PT LONG TERM GOAL #2   Title Pt will be IND in HEP to improve strength, dizziness, and balance.   Status New     PT LONG TERM GOAL #3   Title Pt will decrease falls risk during gait as indicated by FGA score of > or = 19/30   Baseline 17/30 without SPC for first half of test; end of test pt required use of SPC   Status Revised     PT LONG TERM GOAL #4   Title Pt will amb. 1000' over even/uneven surfaces, with LRAD, while performing turns and head turns, at MOD I level to improve functional  mobility.    Status New     PT LONG TERM GOAL #5   Title Pt will improve DHI to </=4% to improve quality of life.    Status New               Plan - 01/19/17 1302  Clinical Impression Statement Treatment session today focused on assessment of falls risk during gait with FGA; pt does demonstrate increased risk for falls during more dynamic gait without AD.  Initiated balance HEP based on findings.  Will continue to assess peripheral canals and treat as indicated and will continue to progress HEP.   Rehab Potential Excellent   Clinical Impairments Affecting Rehab Potential see above   PT Frequency 2x / week   PT Duration 4 weeks   PT Treatment/Interventions ADLs/Self Care Home Management;Biofeedback;Canalith Repostioning;Neuromuscular re-education;Balance training;Therapeutic exercise;Manual techniques;Therapeutic activities;Functional mobility training;Stair training;Gait training;Orthotic Fit/Training;DME Instruction;Patient/family education;Vestibular   PT Next Visit Plan re-test and treat LBPPV as indicated; progress balance/vestibular HEP.    Consulted and Agree with Plan of Care Patient;Family member/caregiver   Family Member Consulted dtr: Nicholos Johns      Patient will benefit from skilled therapeutic intervention in order to improve the following deficits and impairments:  Abnormal gait, Dizziness, Postural dysfunction, Impaired flexibility, Decreased mobility, Decreased balance, Decreased strength  Visit Diagnosis: BPPV (benign paroxysmal positional vertigo), left  Dizziness and giddiness  Other abnormalities of gait and mobility     Problem List Patient Active Problem List   Diagnosis Date Noted  . Carotid stenosis 03/09/2014  . Aftercare following surgery of the circulatory system 03/09/2014  . Aftercare following surgery of the circulatory system, NEC 03/08/2013  . Occlusion and stenosis of carotid artery without mention of cerebral infarction 02/04/2012     Edman Circle, PT, DPT 01/19/17    1:06 PM    [facility] [address] [place], [place], [address] Phone: [phone]   Fax:  [phone]  Name: [name] Love MRN: [id number] Date of Birth: [birth year]

## 2017-01-19 NOTE — Patient Instructions (Signed)
Tandem Walking    Hold on to counter top with one hand!! Walk with each foot directly in front of other, heel of one foot touching toes of other foot with each step. Both feet straight ahead.  Perform 4 laps at counter top.  Hip (Side)    Stand with support. Inhale, then exhale while extending leg directly to side, leading with heel and keeping upper body still.  Slowly return to starting position. Repeat _10___ times each leg. Do _2___ sets per session. Do _2___ sessions per day.  Single Leg - Eyes Open    Holding support, lift right leg while maintaining balance over other leg. . Hold__10__ seconds keeping hips level. Repeat _2___ times per leg per session. Do __2__ sessions per day.  Balance: Eyes Closed - Bilateral (Varied Surfaces)    Stand in a corner with chair in front of you, place your feet together and close eyes. Maintain balance 10 seconds. Repeat 2 times per set. Do 2 sessions per day

## 2017-01-27 ENCOUNTER — Ambulatory Visit: Payer: Medicare Other

## 2017-01-28 ENCOUNTER — Ambulatory Visit: Payer: Medicare Other

## 2017-01-28 DIAGNOSIS — R2689 Other abnormalities of gait and mobility: Secondary | ICD-10-CM

## 2017-01-28 DIAGNOSIS — R42 Dizziness and giddiness: Secondary | ICD-10-CM

## 2017-01-28 DIAGNOSIS — H8112 Benign paroxysmal vertigo, left ear: Secondary | ICD-10-CM | POA: Diagnosis not present

## 2017-01-28 NOTE — Therapy (Signed)
[facility] [address] [place], [place], [address] Phone: [phone]   Fax:  [phone]  Physical Therapy Treatment  Patient Details  Name: [name] Love MRN: [id number] Date of Birth: 1922-07-28 Referring Provider: Dr. Pete Glatter  Encounter Date: 01/28/2017      PT End of Session - 01/28/17 1059    Visit Number 4   Number of Visits 9   Date for PT Re-Evaluation 02/13/17   Authorization Type BCBS Medicare: G-CODE AND PN EVERY 10TH VISIT. No auth required.   PT Start Time 1015   PT Stop Time 1057   PT Time Calculation (min) 42 min   Equipment Utilized During Treatment Gait belt   Activity Tolerance Patient tolerated treatment well   Behavior During Therapy WFL for tasks assessed/performed      Past Medical History:  Diagnosis Date  . Atrial fibrillation (HCC)   . Carotid artery occlusion   . Constipation 02/04/2008  . Diverticulosis of colon   . Dizziness   . Esophageal reflux   . Hyperlipidemia   . Hypertension   . Thyroid disease   . Urticaria   . Varicose veins     Past Surgical History:  Procedure Laterality Date  . APPENDECTOMY    . CAROTID ENDARTERECTOMY Left 1990    There were no vitals filed for this visit.      Subjective Assessment - 01/28/17 1017    Subjective Pt denied falls since last visit. Pt reported she still feels woozy/lightheaded at times but denied spinning.    Patient is accompained by: Family member   Pertinent History HOH-hearing aides, HTN, HLD, hypothyroidism, CAD-s/p L carotid endarterectomy and 40-59% stenosis on R, a-fib, hx of MI, vertigo, osteopenia, gout   Patient Stated Goals "To not have dizzy spells."   Currently in Pain? No/denies                         [facility] Adult PT Treatment/Exercise - 01/28/17 1018      Ambulation/Gait   Ambulation/Gait Yes   Ambulation/Gait Assistance 5: Supervision;4: Min guard;4: Min assist   Ambulation/Gait  Assistance Details Min guard to min A over grassy/gravel terrain. S over paved surfaces, except for min A required during 1 LOB over uneven sidewalk. Cues for weight shifting while traversing inclines/declines.   Ambulation Distance (Feet) 600 Feet   Assistive device Straight cane   Gait Pattern Step-through pattern;Decreased stride length;Decreased trunk rotation   Ambulation Surface Level;Unlevel;Indoor;Outdoor;Paved;Gravel;Grass     High Level Balance   High Level Balance Activities Backward walking;Head turns;Tandem walking;Marching forwards   High Level Balance Comments Performed over red/blue mats with min guard to min A for balance: 4x15' with cues and demo for technique and to improve lateral wt. shifting. Pt also performed cone taps (single over non-compliant surface in //bars, 4x5 reps/LE. Cues for technique and weight shifting during SLS. Pt required two seated rest breaks 2/2 fatigue.                   PT Short Term Goals - 01/14/17 1355      PT SHORT TERM GOAL #1   Title same as LTGs           PT Long Term Goals - 01/19/17 1303      PT LONG TERM GOAL #1   Title Pt will report 0/10 during all positional testing to reduce dizziness. TARGET DATE FOR ALL LTGS: 02/11/17   Status New  PT LONG TERM GOAL #2   Title Pt will be IND in HEP to improve strength, dizziness, and balance.   Status New     PT LONG TERM GOAL #3   Title Pt will decrease falls risk during gait as indicated by FGA score of > or = 19/30   Baseline 17/30 without SPC for first half of test; end of test pt required use of SPC   Status Revised     PT LONG TERM GOAL #4   Title Pt will amb. 1000' over even/uneven surfaces, with LRAD, while performing turns and head turns, at MOD I level to improve functional mobility.    Status New     PT LONG TERM GOAL #5   Title Pt will improve DHI to </=4% to improve quality of life.    Status New               Plan - 01/28/17 1059    Clinical  Impression Statement Pt demonstrated progress, as she was able to perform high level balance activities over non-compliant and compliant surfaces. Pt required min A during L SLS activities, as pt reported LLE is weaker 2/2 OA than RLE (she received PT for LLE several months ago). Pt required seated rest breaks 2/2 fatigue. Pt demonstrated incr. postural sway and LOB during SLS activities, and during activities which require incr. vestibular input. Continue with POC.    Rehab Potential Excellent   Clinical Impairments Affecting Rehab Potential see above   PT Frequency 2x / week   PT Duration 4 weeks   PT Treatment/Interventions ADLs/Self Care Home Management;Biofeedback;Canalith Repostioning;Neuromuscular re-education;Balance training;Therapeutic exercise;Manual techniques;Therapeutic activities;Functional mobility training;Stair training;Gait training;Orthotic Fit/Training;DME Instruction;Patient/family education;Vestibular   PT Next Visit Plan re-test and treat LBPPV as indicated; progress balance/vestibular HEP.    Consulted and Agree with Plan of Care Patient;Family member/caregiver   Family Member Consulted dtr: Nicholos Johns      Patient will benefit from skilled therapeutic intervention in order to improve the following deficits and impairments:  Abnormal gait, Dizziness, Postural dysfunction, Impaired flexibility, Decreased mobility, Decreased balance, Decreased strength  Visit Diagnosis: Other abnormalities of gait and mobility  Dizziness and giddiness     Problem List Patient Active Problem List   Diagnosis Date Noted  . Carotid stenosis 03/09/2014  . Aftercare following surgery of the circulatory system 03/09/2014  . Aftercare following surgery of the circulatory system, NEC 03/08/2013  . Occlusion and stenosis of carotid artery without mention of cerebral infarction 02/04/2012    Kanesha Cadle L 01/28/2017, 11:01 AM  [facility] [address] [place], [place], [address] Phone: [phone]   Fax:  [phone]  Name: SHELETHA BOW MRN: [id number] Date of Birth: 25-Nov-1922  Zerita Boers, PT,DPT 01/28/17 11:02 AM Phone: [phone] Fax: [phone]

## 2017-01-29 DIAGNOSIS — I129 Hypertensive chronic kidney disease with stage 1 through stage 4 chronic kidney disease, or unspecified chronic kidney disease: Secondary | ICD-10-CM | POA: Diagnosis not present

## 2017-01-29 DIAGNOSIS — N183 Chronic kidney disease, stage 3 (moderate): Secondary | ICD-10-CM | POA: Diagnosis not present

## 2017-01-30 ENCOUNTER — Ambulatory Visit: Payer: Medicare Other

## 2017-01-30 VITALS — BP 83/45 | HR 67

## 2017-01-30 DIAGNOSIS — R42 Dizziness and giddiness: Secondary | ICD-10-CM

## 2017-01-30 DIAGNOSIS — R2689 Other abnormalities of gait and mobility: Secondary | ICD-10-CM

## 2017-01-30 DIAGNOSIS — H8112 Benign paroxysmal vertigo, left ear: Secondary | ICD-10-CM | POA: Diagnosis not present

## 2017-01-30 NOTE — Therapy (Signed)
[facility] [address] [place], [place], [address] Phone: [phone]   Fax:  [phone]  Physical Therapy Treatment  Patient Details  Name: [name] Love MRN: [id number] Date of Birth: [birth year] Referring Provider: Dr. Pete Glatter  Encounter Date: 01/30/2017      PT End of Session - 01/30/17 1412    Visit Number 5   Number of Visits 9   Date for PT Re-Evaluation 02/13/17   Authorization Type BCBS Medicare: G-CODE AND PN EVERY 10TH VISIT. No auth required.   PT Start Time 1320  pt arrived late   PT Stop Time 1401   PT Time Calculation (min) 41 min   Equipment Utilized During Treatment Gait belt   Activity Tolerance Patient tolerated treatment well   Behavior During Therapy WFL for tasks assessed/performed      Past Medical History:  Diagnosis Date  . Atrial fibrillation (HCC)   . Carotid artery occlusion   . Constipation 02/04/2008  . Diverticulosis of colon   . Dizziness   . Esophageal reflux   . Hyperlipidemia   . Hypertension   . Thyroid disease   . Urticaria   . Varicose veins     Past Surgical History:  Procedure Laterality Date  . APPENDECTOMY    . CAROTID ENDARTERECTOMY Left 1990    Vitals:   01/30/17 1325 01/30/17 1355 01/30/17 1356  BP: 124/67 (!) 93/57 (!) 83/45  Pulse: 69 72 67        Subjective Assessment - 01/30/17 1324    Subjective Pt denied falls since last visit. pt reported Dr. Pete Glatter had pt cease Amlodopine medication yesterday, due to orthostasis. Pt reported fatigue today. Pt late 2/2 traffic.    Patient is accompained by: Family member   Pertinent History HOH-hearing aides, HTN, HLD, hypothyroidism, CAD-s/p L carotid endarterectomy and 40-59% stenosis on R, a-fib, hx of MI, vertigo, osteopenia, gout   Patient Stated Goals "To not have dizzy spells."   Currently in Pain? No/denies                              Balance Exercises - 01/30/17 1329       Balance Exercises: Standing   Standing Eyes Opened Narrow base of support (BOS);Wide (BOA);Head turns;Foam/compliant surface;3 reps;Other reps (comment)  10 reps   Standing Eyes Closed Narrow base of support (BOS);Head turns;3 reps;10 secs;Foam/compliant surface;Other (comment)  10 reps   Rockerboard Anterior/posterior;Lateral;Head turns;EO;EC;10 seconds;30 seconds;5 reps  3 reps of EC, short rockerboard   Other Standing Exercises In //bars with intermittent UE support and min guard to min A for safety: cues and demo for technique.           PT Education - 01/30/17 1410    Education provided Yes   Education Details PT discussed the importance of taking BP every day, as pt's BP decr. during session but pt denied lightheadedness during session. PT will send note to MD, PT encouraged pt to notify MD if lightheadedness returns or other symptoms.  PT provided education to pt and pt's dtr regarding inner ear exercises and encouraged pt to continue current exercises and we review them next session and progress as tolerated.    Person(s) Educated Patient;Child(ren)   Methods Explanation   Comprehension Verbalized understanding          PT Short Term Goals - 01/14/17 1355      PT SHORT TERM GOAL #1  Title same as LTGs           PT Long Term Goals - 01/19/17 1303      PT LONG TERM GOAL #1   Title Pt will report 0/10 during all positional testing to reduce dizziness. TARGET DATE FOR ALL LTGS: 02/11/17   Status New     PT LONG TERM GOAL #2   Title Pt will be IND in HEP to improve strength, dizziness, and balance.   Status New     PT LONG TERM GOAL #3   Title Pt will decrease falls risk during gait as indicated by FGA score of > or = 19/30   Baseline 17/30 without SPC for first half of test; end of test pt required use of SPC   Status Revised     PT LONG TERM GOAL #4   Title Pt will amb. 1000' over even/uneven surfaces, with LRAD, while performing turns and head turns, at  MOD I level to improve functional mobility.    Status New     PT LONG TERM GOAL #5   Title Pt will improve DHI to </=4% to improve quality of life.    Status New               Plan - 01/30/17 1413    Clinical Impression Statement Pt demostrated progress, as she was able to tolerate high level balance activities over compliant/moving surfaces with min guard to min A (during EC activities). PT assessed pt's BP during session as pt reported MD ceased pt's BP medication and it decr. during session but pt denied lightheadedness and other sx's. PT encouraged pt to have BP assessed every day and to inform MD if it remains low and if pt has sx's. Pt would continue to benefit from skilled PT to improve safety during functional mobility.   Rehab Potential Excellent   Clinical Impairments Affecting Rehab Potential see above   PT Frequency 2x / week   PT Duration 4 weeks   PT Treatment/Interventions ADLs/Self Care Home Management;Biofeedback;Canalith Repostioning;Neuromuscular re-education;Balance training;Therapeutic exercise;Manual techniques;Therapeutic activities;Functional mobility training;Stair training;Gait training;Orthotic Fit/Training;DME Instruction;Patient/family education;Vestibular   PT Next Visit Plan re-test and treat LBPPV as indicated; progress balance/vestibular HEP.    Consulted and Agree with Plan of Care Patient;Family member/caregiver   Family Member Consulted dtr: Nicholos Johns      Patient will benefit from skilled therapeutic intervention in order to improve the following deficits and impairments:  Abnormal gait, Dizziness, Postural dysfunction, Impaired flexibility, Decreased mobility, Decreased balance, Decreased strength  Visit Diagnosis: Other abnormalities of gait and mobility  Dizziness and giddiness     Problem List Patient Active Problem List   Diagnosis Date Noted  . Carotid stenosis 03/09/2014  . Aftercare following surgery of the circulatory system  03/09/2014  . Aftercare following surgery of the circulatory system, NEC 03/08/2013  . Occlusion and stenosis of carotid artery without mention of cerebral infarction 02/04/2012    Lucyann Romano L 01/30/2017, 2:21 PM  [facility] [address] [place], [place], [address] Phone: [phone]   Fax:  [phone]  Name: [name] Love MRN: [id number] Date of Birth: [birth year]  Zerita Boers, PT,DPT 01/30/17 2:22 PM Phone: [phone] Fax: [phone]

## 2017-02-03 ENCOUNTER — Ambulatory Visit: Payer: Medicare Other

## 2017-02-03 DIAGNOSIS — R2689 Other abnormalities of gait and mobility: Secondary | ICD-10-CM | POA: Diagnosis not present

## 2017-02-03 DIAGNOSIS — H8112 Benign paroxysmal vertigo, left ear: Secondary | ICD-10-CM

## 2017-02-03 DIAGNOSIS — R42 Dizziness and giddiness: Secondary | ICD-10-CM

## 2017-02-03 NOTE — Therapy (Signed)
[facility] [address] [place], [place], [address] Phone: [phone]   Fax:  [phone]  Physical Therapy Treatment  Patient Details  Name: [name] Love MRN: [id number] Date of Birth: 24-Apr-1923 Referring Provider: Dr. Pete Glatter  Encounter Date: 02/03/2017      PT End of Session - 02/03/17 1154    Visit Number 6   Number of Visits 9   Date for PT Re-Evaluation 02/13/17   Authorization Type BCBS Medicare: G-CODE AND PN EVERY 10TH VISIT. No auth required.   PT Start Time 1101   PT Stop Time 1145   PT Time Calculation (min) 44 min   Activity Tolerance Other (comment)  pt limited by dizziness and nausea   Behavior During Therapy [facility] for tasks assessed/performed      Past Medical History:  Diagnosis Date  . Atrial fibrillation (HCC)   . Carotid artery occlusion   . Constipation 02/04/2008  . Diverticulosis of colon   . Dizziness   . Esophageal reflux   . Hyperlipidemia   . Hypertension   . Thyroid disease   . Urticaria   . Varicose veins     Past Surgical History:  Procedure Laterality Date  . APPENDECTOMY    . CAROTID ENDARTERECTOMY Left 1990    There were no vitals filed for this visit.      Subjective Assessment - 02/03/17 1104    Subjective Pt denied falls. Pt reported dizziness is resolved but still has intermittent lightheadedness and fatigue.    Patient is accompained by: Family member   Pertinent History HOH-hearing aides, HTN, HLD, hypothyroidism, CAD-s/p Love carotid endarterectomy and 40-59% stenosis on R, a-fib, hx of MI, vertigo, osteopenia, gout   Patient Stated Goals "To not have dizzy spells."   Currently in Pain? No/denies                Vestibular Assessment - 02/03/17 1117      Dix-Hallpike Right   Dix-Hallpike Right Duration none   Dix-Hallpike Right Symptoms No nystagmus     Dix-Hallpike Left   Dix-Hallpike Left Duration <5 seconds, 3/10 dizziness   Dix-Hallpike Left Symptoms Upbeat, left rotatory nystagmus;Other (comment)     Horizontal Canal Right   Horizontal Canal Right Duration none   Horizontal Canal Right Symptoms Normal     Horizontal Canal Left   Horizontal Canal Left Duration none   Horizontal Canal Left Symptoms Normal                  Vestibular Treatment/Exercise - 02/03/17 0001      Vestibular Treatment/Exercise   Vestibular Treatment Provided Canalith Repositioning   Canalith Repositioning Epley Manuever Left;Comment  Love BBQ roll       EPLEY MANUEVER LEFT   Number of Reps  1   Overall Response  Symptoms Resolved  Pt's Love upbeating nystagmus resolved    RESPONSE DETAILS LEFT Pt reported incr. dizziness 2/2 horizontal nystagmus after Love epleys, as pt converted to Love horizontal canal BPPV, as she changed head position during Epley treatment.      OTHER   Comment PT performed Love BBQ roll after Love epley treatment, unable to determine detailed direction of nystamgus, only that it is horizontal as pt kept eyes closed. PT did not retest 2/2 pt's severe nausea.                PT Education - 02/03/17 1153    Education provided Yes   Education Details PT discussed  the importance of pt f/u with Dr. Pete Glatter regarding decr. in BP during standing, as pt continues to feel fatigued and lightheaded. PT discussed exam findings, consistent with Love pBPPV converting to Love horizontal canal BPPV, based on s/s.           PT Short Term Goals - 01/14/17 1355      PT SHORT TERM GOAL #1   Title same as LTGs           PT Long Term Goals - 01/19/17 1303      PT LONG TERM GOAL #1   Title Pt will report 0/10 during all positional testing to reduce dizziness. TARGET DATE FOR ALL LTGS: 02/11/17   Status New     PT LONG TERM GOAL #2   Title Pt will be IND in HEP to improve strength, dizziness, and balance.   Status New     PT LONG TERM GOAL #3   Title Pt will decrease falls risk during gait as indicated by FGA  score of > or = 19/30   Baseline 17/30 without SPC for first half of test; end of test pt required use of SPC   Status Revised     PT LONG TERM GOAL #4   Title Pt will amb. 1000' over even/uneven surfaces, with LRAD, while performing turns and head turns, at MOD I level to improve functional mobility.    Status New     PT LONG TERM GOAL #5   Title Pt will improve DHI to </=4% to improve quality of life.    Status New               Plan - 02/03/17 1154    Clinical Impression Statement Pt's residual dizziness likely 2/2 Love pBPPV, based on Love upbeating nystagmus and dizziness. Pt lifted her head up during Epley treatment and the reported incr. dizziness during re-testing of Love Dix-Hallpike with horizontal nystagmus. PT unable to determine detailed direction of nystagmus as pt had to close her eyes 2/2 severe nausea and dizziness. Pt required incr. time prior to tolerating BBQ roll 2/2 nausea/dizziness. PT will reassess for BPPV next session.    Rehab Potential Excellent   Clinical Impairments Affecting Rehab Potential see above   PT Frequency 2x / week   PT Duration 4 weeks   PT Treatment/Interventions ADLs/Self Care Home Management;Biofeedback;Canalith Repostioning;Neuromuscular re-education;Balance training;Therapeutic exercise;Manual techniques;Therapeutic activities;Functional mobility training;Stair training;Gait training;Orthotic Fit/Training;DME Instruction;Patient/family education;Vestibular   PT Next Visit Plan re-test and treat Love post/horizontal canal BPPV as indicated; progress balance/vestibular HEP.    Consulted and Agree with Plan of Care Patient;Family member/caregiver   Family Member Consulted dtr: Nicholos Johns      Patient will benefit from skilled therapeutic intervention in order to improve the following deficits and impairments:  Abnormal gait, Dizziness, Postural dysfunction, Impaired flexibility, Decreased mobility, Decreased balance, Decreased strength  Visit  Diagnosis: BPPV (benign paroxysmal positional vertigo), left  Dizziness and giddiness     Problem List Patient Active Problem List   Diagnosis Date Noted  . Carotid stenosis 03/09/2014  . Aftercare following surgery of the circulatory system 03/09/2014  . Aftercare following surgery of the circulatory system, NEC 03/08/2013  . Occlusion and stenosis of carotid artery without mention of cerebral infarction 02/04/2012    [name] Love 02/03/2017, 11:58 AM  [facility] [address] [place], [place], [address] Phone: [phone]   Fax:  [phone]  Name: [name] Love MRN: [id number] Date of Birth: [birth year]  Zerita Boers, PT,DPT 02/03/17 12:01 PM Phone: [phone] Fax: [phone]

## 2017-02-05 ENCOUNTER — Ambulatory Visit: Payer: Medicare Other

## 2017-02-05 DIAGNOSIS — R42 Dizziness and giddiness: Secondary | ICD-10-CM

## 2017-02-05 DIAGNOSIS — H8112 Benign paroxysmal vertigo, left ear: Secondary | ICD-10-CM | POA: Diagnosis not present

## 2017-02-05 DIAGNOSIS — R2689 Other abnormalities of gait and mobility: Secondary | ICD-10-CM

## 2017-02-05 NOTE — Therapy (Signed)
[facility] [address] [place], [place], [address] Phone: [phone]   Fax:  [phone]  Physical Therapy Treatment  Patient Details  Name: [name] Love MRN: [id number] Date of Birth: 04/01/1923 Referring Provider: Dr. Pete GlatterStoneking  Encounter Date: 02/05/2017      PT End of Session - 02/05/17 1155    Visit Number 7   Number of Visits 9   Date for PT Re-Evaluation 02/13/17   Authorization Type BCBS Medicare: G-CODE AND PN EVERY 10TH VISIT. No auth required.   PT Start Time 1101   PT Stop Time 1143   PT Time Calculation (min) 42 min   Equipment Utilized During Treatment Gait belt   Activity Tolerance Patient tolerated treatment well   Behavior During Therapy WFL for tasks assessed/performed      Past Medical History:  Diagnosis Date  . Atrial fibrillation (HCC)   . Carotid artery occlusion   . Constipation 02/04/2008  . Diverticulosis of colon   . Dizziness   . Esophageal reflux   . Hyperlipidemia   . Hypertension   . Thyroid disease   . Urticaria   . Varicose veins     Past Surgical History:  Procedure Laterality Date  . APPENDECTOMY    . CAROTID ENDARTERECTOMY Left 1990    There were no vitals filed for this visit.      Subjective Assessment - 02/05/17 1104    Subjective Pt reported her dtr called the MD and he'd like pt's BP measured (supine, seated, and standing) 3x/week. Pt denied falls since last visit. Pt reported intermittent dizziness but not severe.   Patient is accompained by: Family member   Pertinent History HOH-hearing aides, HTN, HLD, hypothyroidism, CAD-s/p L carotid endarterectomy and 40-59% stenosis on R, a-fib, hx of MI, vertigo, osteopenia, gout   Patient Stated Goals "To not have dizzy spells."   Currently in Pain? No/denies                Vestibular Assessment - 02/05/17 1110      Positional Testing   Dix-Hallpike Dix-Hallpike Right;Dix-Hallpike Left    Horizontal Canal Testing Horizontal Canal Right;Horizontal Canal Left     Dix-Hallpike Left   Dix-Hallpike Left Duration none   Dix-Hallpike Left Symptoms No nystagmus     Horizontal Canal Left   Horizontal Canal Left Duration none   Horizontal Canal Left Symptoms Normal     Orthostatics   BP supine (x 5 minutes) 145/75   HR supine (x 5 minutes) 66   BP sitting 108/63  pt reported some lightheadedness (felt "not clear)sitting up   HR sitting 68   BP standing (after 1 minute) 107/52   HR standing (after 1 minute) 70   BP standing (after 3 minutes) 100/61   HR standing (after 3 minutes) 68   Orthostatics Comment Pt reported lightheadedness upon sitting upright.                      Balance Exercises - 02/05/17 1153      Balance Exercises: Standing   Rockerboard Anterior/posterior;Lateral;Head turns;EO;EC;10 seconds;30 seconds;5 reps  tall rockerboard   Other Standing Exercises In //bars without UE support and min guard to min A for safety: cues and demo for technique.           PT Education - 02/05/17 1154    Education provided Yes   Education Details PT discussed exam findings negative for BPPV. Pt asked PT to send notes  to Dr. Pete Glatter regarding orthostatic hypotension, pt agreed.   Person(s) Educated Patient   Methods Explanation   Comprehension Verbalized understanding          PT Short Term Goals - 01/14/17 1355      PT SHORT TERM GOAL #1   Title same as LTGs           PT Long Term Goals - 01/19/17 1303      PT LONG TERM GOAL #1   Title Pt will report 0/10 during all positional testing to reduce dizziness. TARGET DATE FOR ALL LTGS: 02/11/17   Status New     PT LONG TERM GOAL #2   Title Pt will be IND in HEP to improve strength, dizziness, and balance.   Status New     PT LONG TERM GOAL #3   Title Pt will decrease falls risk during gait as indicated by FGA score of > or = 19/30   Baseline 17/30 without SPC for first half of test; end  of test pt required use of SPC   Status Revised     PT LONG TERM GOAL #4   Title Pt will amb. 1000' over even/uneven surfaces, with LRAD, while performing turns and head turns, at MOD I level to improve functional mobility.    Status New     PT LONG TERM GOAL #5   Title Pt will improve DHI to </=4% to improve quality of life.    Status New               Plan - 02/05/17 1156    Clinical Impression Statement Pt demonstrated progress, as positional testing negative for s/s of BPPV. Pt continues to experience a significant decr. in BP during orthostatic hypotension testing and reports lightheadedness/wooziness. Pt demonstrated incr. postural sway during activities which require incr. vestibular input. Continue with POC.    Rehab Potential Excellent   Clinical Impairments Affecting Rehab Potential see above   PT Frequency 2x / week   PT Duration 4 weeks   PT Treatment/Interventions ADLs/Self Care Home Management;Biofeedback;Canalith Repostioning;Neuromuscular re-education;Balance training;Therapeutic exercise;Manual techniques;Therapeutic activities;Functional mobility training;Stair training;Gait training;Orthotic Fit/Training;DME Instruction;Patient/family education;Vestibular   PT Next Visit Plan re-test and treat L post/horizontal canal BPPV as indicated; progress balance/vestibular HEP. Begin to assess goals.    Consulted and Agree with Plan of Care Patient;Family member/caregiver   Family Member Consulted dtr: Nicholos Johns      Patient will benefit from skilled therapeutic intervention in order to improve the following deficits and impairments:  Abnormal gait, Dizziness, Postural dysfunction, Impaired flexibility, Decreased mobility, Decreased balance, Decreased strength  Visit Diagnosis: BPPV (benign paroxysmal positional vertigo), left  Dizziness and giddiness  Other abnormalities of gait and mobility     Problem List Patient Active Problem List   Diagnosis Date Noted   . Carotid stenosis 03/09/2014  . Aftercare following surgery of the circulatory system 03/09/2014  . Aftercare following surgery of the circulatory system, NEC 03/08/2013  . Occlusion and stenosis of carotid artery without mention of cerebral infarction 02/04/2012    Herta Hink L 02/05/2017, 11:58 AM  [facility] [address] [place], [place], [address] Phone: [phone]   Fax:  [phone]  Name: [name] Love MRN: [id number] Date of Birth: 1922-10-07  Zerita Boers, PT,DPT 02/05/17 11:59 AM Phone: [phone] Fax: [phone]

## 2017-02-12 ENCOUNTER — Ambulatory Visit: Payer: Medicare Other | Attending: Geriatric Medicine

## 2017-02-12 DIAGNOSIS — H8112 Benign paroxysmal vertigo, left ear: Secondary | ICD-10-CM | POA: Diagnosis not present

## 2017-02-12 DIAGNOSIS — R42 Dizziness and giddiness: Secondary | ICD-10-CM | POA: Diagnosis not present

## 2017-02-12 DIAGNOSIS — R2689 Other abnormalities of gait and mobility: Secondary | ICD-10-CM | POA: Diagnosis not present

## 2017-02-12 NOTE — Therapy (Signed)
[facility] [address] [place], [place], [address] Phone: [phone]   Fax:  [phone]  Physical Therapy Treatment  Patient Details  Name: [name] Love MRN: [id number] Date of Birth: 09/18/1922 Referring Provider: Dr. Pete Glatter  Encounter Date: 02/12/2017      PT End of Session - 02/12/17 1041    Visit Number 8   Number of Visits 9 (17)   Date for PT Re-Evaluation 02/13/17 (requested add'l 2x/week for 4 weeks: 03/15/17)   Authorization Type BCBS Medicare: G-CODE AND PN EVERY 10TH VISIT. No auth required.   PT Start Time 1021  pt in restroom   PT Stop Time 1039   PT Time Calculation (min) 18 min   Activity Tolerance Other (comment)  limited by wooziness   Behavior During Therapy [facility] for tasks assessed/performed      Past Medical History:  Diagnosis Date  . Atrial fibrillation (HCC)   . Carotid artery occlusion   . Constipation 02/04/2008  . Diverticulosis of colon   . Dizziness   . Esophageal reflux   . Hyperlipidemia   . Hypertension   . Thyroid disease   . Urticaria   . Varicose veins     Past Surgical History:  Procedure Laterality Date  . APPENDECTOMY    . CAROTID ENDARTERECTOMY Left 1990    There were no vitals filed for this visit.      Subjective Assessment - 02/12/17 1030    Subjective Pt reported she feels vertigo is better but is still very woozy. Pt denied falls since last visit. Pt's dtr reported pt didn't want to come to therapy today, as she felt so woozy and fatigued.    Patient is accompained by: Family member   Pertinent History HOH-hearing aides, HTN, HLD, hypothyroidism, CAD-s/p L carotid endarterectomy and 40-59% stenosis on R, a-fib, hx of MI, vertigo, osteopenia, gout   Patient Stated Goals "To not have dizzy spells."   Currently in Pain? No/denies                Vestibular Assessment - 02/12/17 1031      Positional Testing   Dix-Hallpike Dix-Hallpike  Right;Dix-Hallpike Left   Horizontal Canal Testing Horizontal Canal Right;Horizontal Canal Left     Dix-Hallpike Right   Dix-Hallpike Right Duration none   Dix-Hallpike Right Symptoms No nystagmus     Dix-Hallpike Left   Dix-Hallpike Left Duration none   Dix-Hallpike Left Symptoms No nystagmus     Horizontal Canal Right   Horizontal Canal Right Duration none   Horizontal Canal Right Symptoms Normal     Horizontal Canal Left   Horizontal Canal Left Duration none   Horizontal Canal Left Symptoms Normal                         PT Education - 02/12/17 1041    Education provided Yes   Education Details PT discussed exam findings continue to be negative for BPPV. PT educated pt on holding PT until she is able to see Dr. Pete Glatter in order to manage orthostatic BP.    Person(s) Educated Patient;Child(ren)   Methods Explanation   Comprehension Verbalized understanding          PT Short Term Goals - 01/14/17 1355      PT SHORT TERM GOAL #1   Title same as LTGs           PT Long Term Goals - 02/12/17 1045  PT LONG TERM GOAL #1   Title Pt will report 0/10 during all positional testing to reduce dizziness. TARGET DATE FOR ALL LTGS: 02/11/17   Baseline all unmet goals will be carried over to new POC: 03/13/17   Status Achieved     PT LONG TERM GOAL #2   Title Pt will be IND in HEP to improve strength, dizziness, and balance.   Status On-going     PT LONG TERM GOAL #3   Title Pt will decrease falls risk during gait as indicated by FGA score of > or = 19/30   Baseline 17/30 without SPC for first half of test; end of test pt required use of SPC   Status On-going     PT LONG TERM GOAL #4   Title Pt will amb. 1000' over even/uneven surfaces, with LRAD, while performing turns and head turns, at MOD I level to improve functional mobility.    Status On-going     PT LONG TERM GOAL #5   Title Pt will improve DHI to </=4% to improve quality of life.    Status  On-going               Plan - 02/12/17 1042    Clinical Impression Statement BPPV testing in B canals continues to be negative and pt denied spinning sensation. Pt continues to experience wooziness and PT would like to hold therapy until pt sees Dr. Pete GlatterStoneking for BP management. Therapy sessions to improve vestibular input is limited 2/2 orthostatic hypotension symptoms, due to safety and wooziness. PT placing pt on hold until she sees MD and will send note to MD. PT will request addtional 2x/week for 4 weeks to address vestibular impairments.    Rehab Potential Excellent   Clinical Impairments Affecting Rehab Potential see above   PT Frequency 2x / week  requesting add'l 2x/week for 4 weeks   PT Duration 4 weeks   PT Treatment/Interventions ADLs/Love Care Home Management;Biofeedback;Canalith Repostioning;Neuromuscular re-education;Balance training;Therapeutic exercise;Manual techniques;Therapeutic activities;Functional mobility training;Stair training;Gait training;Orthotic Fit/Training;DME Instruction;Patient/family education;Vestibular   PT Next Visit Plan Assess goals upon return from MD (on hold until BP is medically managed). re-test and treat L post/horizontal canal BPPV as indicated; progress balance/vestibular HEP.    Consulted and Agree with Plan of Care Patient;Family member/caregiver   Family Member Consulted dtr: Nicholos JohnsKathleen      Patient will benefit from skilled therapeutic intervention in order to improve the following deficits and impairments:  Abnormal gait, Dizziness, Postural dysfunction, Impaired flexibility, Decreased mobility, Decreased balance, Decreased strength  Visit Diagnosis: BPPV (benign paroxysmal positional vertigo), left - Plan: PT plan of care cert/re-cert  Dizziness and giddiness - Plan: PT plan of care cert/re-cert  Other abnormalities of gait and mobility - Plan: PT plan of care cert/re-cert     Problem List Patient Active Problem List    Diagnosis Date Noted  . Carotid stenosis 03/09/2014  . Aftercare following surgery of the circulatory system 03/09/2014  . Aftercare following surgery of the circulatory system, NEC 03/08/2013  . Occlusion and stenosis of carotid artery without mention of cerebral infarction 02/04/2012    Idrees Quam L 02/12/2017, 10:48 AM  [facility] [address] [place], [place], [address] Phone: [phone]   Fax:  [phone]  Name: Kirstie MirzaMary J Branson MRN: [id number] Date of Birth: 10/01/1922  Zerita BoersJennifer Yamir Carignan, PT,DPT 02/12/17 10:49 AM Phone: [phone] Fax: [phone]

## 2017-02-13 DIAGNOSIS — I129 Hypertensive chronic kidney disease with stage 1 through stage 4 chronic kidney disease, or unspecified chronic kidney disease: Secondary | ICD-10-CM | POA: Diagnosis not present

## 2017-02-13 DIAGNOSIS — Z79899 Other long term (current) drug therapy: Secondary | ICD-10-CM | POA: Diagnosis not present

## 2017-02-13 DIAGNOSIS — N183 Chronic kidney disease, stage 3 (moderate): Secondary | ICD-10-CM | POA: Diagnosis not present

## 2017-02-13 DIAGNOSIS — Z8679 Personal history of other diseases of the circulatory system: Secondary | ICD-10-CM | POA: Diagnosis not present

## 2017-02-19 ENCOUNTER — Encounter: Payer: Medicare Other | Admitting: Physical Therapy

## 2017-02-19 DIAGNOSIS — E875 Hyperkalemia: Secondary | ICD-10-CM | POA: Diagnosis not present

## 2017-03-05 ENCOUNTER — Ambulatory Visit: Payer: Medicare Other | Admitting: Physical Therapy

## 2017-03-05 DIAGNOSIS — N183 Chronic kidney disease, stage 3 (moderate): Secondary | ICD-10-CM | POA: Diagnosis not present

## 2017-03-05 DIAGNOSIS — I129 Hypertensive chronic kidney disease with stage 1 through stage 4 chronic kidney disease, or unspecified chronic kidney disease: Secondary | ICD-10-CM | POA: Diagnosis not present

## 2017-03-05 DIAGNOSIS — H8112 Benign paroxysmal vertigo, left ear: Secondary | ICD-10-CM | POA: Diagnosis not present

## 2017-03-05 NOTE — Therapy (Signed)
[facility] [address] [place], [place], [address] Phone: [phone]   Fax:  [phone]  Patient Details  Name: [name] MRN: [id number] Date of Birth: Dec 26, 1922 Referring Provider:  No ref. provider found  Encounter Date: 03/05/2017  PHYSICAL THERAPY DISCHARGE SUMMARY  Visits from Start of Care: 8  Current functional level related to goals / functional outcomes:     PT Long Term Goals - 02/12/17 1045      PT LONG TERM GOAL #1   Title Pt will report 0/10 during all positional testing to reduce dizziness. TARGET DATE FOR ALL LTGS: 02/11/17   Baseline all unmet goals will be carried over to new POC: 03/13/17   Status Achieved     PT LONG TERM GOAL #2   Title Pt will be IND in HEP to improve strength, dizziness, and balance.   Status On-going     PT LONG TERM GOAL #3   Title Pt will decrease falls risk during gait as indicated by FGA score of > or = 19/30   Baseline 17/30 without SPC for first half of test; end of test pt required use of SPC   Status On-going     PT LONG TERM GOAL #4   Title Pt will amb. 1000' over even/uneven surfaces, with LRAD, while performing turns and head turns, at MOD I level to improve functional mobility.    Status On-going     PT LONG TERM GOAL #5   Title Pt will improve DHI to </=4% to improve quality of life.    Status On-going       Remaining deficits: Unknown, as pt did not return after her last visit. Pt's BP was very low and pt was going to f/u with MD. Pt's dtr called back to say that pt was performing HEP at home and would like to cancel remaining visits. Pt's vertigo appeared to be greatly reduced.   Education / Equipment: HEP  Plan: Patient agrees to discharge.  Patient goals were not met. Patient is being discharged due to not returning since the last visit.  ?????       Yazmen Briones L 03/05/2017, 1:45 PM  [facility] [address] [place], [place], [address] Phone: [phone]   Fax:  [phone]    Geoffry Paradise, PT,DPT 03/05/17 1:47 PM Phone: [phone] Fax: [phone]

## 2017-03-06 ENCOUNTER — Ambulatory Visit: Payer: Medicare Other

## 2017-03-13 DIAGNOSIS — I951 Orthostatic hypotension: Secondary | ICD-10-CM | POA: Diagnosis not present

## 2017-03-13 DIAGNOSIS — N183 Chronic kidney disease, stage 3 (moderate): Secondary | ICD-10-CM | POA: Diagnosis not present

## 2017-03-13 DIAGNOSIS — I129 Hypertensive chronic kidney disease with stage 1 through stage 4 chronic kidney disease, or unspecified chronic kidney disease: Secondary | ICD-10-CM | POA: Diagnosis not present

## 2017-03-17 ENCOUNTER — Emergency Department (HOSPITAL_COMMUNITY): Payer: Medicare Other

## 2017-03-17 ENCOUNTER — Encounter (HOSPITAL_COMMUNITY): Payer: Self-pay | Admitting: Emergency Medicine

## 2017-03-17 ENCOUNTER — Emergency Department (HOSPITAL_COMMUNITY)
Admission: EM | Admit: 2017-03-17 | Discharge: 2017-03-17 | Disposition: A | Discharge status: Home / Self Care | Payer: Medicare Other | Attending: Emergency Medicine | Admitting: Emergency Medicine

## 2017-03-17 DIAGNOSIS — Z79899 Other long term (current) drug therapy: Secondary | ICD-10-CM | POA: Insufficient documentation

## 2017-03-17 DIAGNOSIS — I10 Essential (primary) hypertension: Secondary | ICD-10-CM | POA: Diagnosis not present

## 2017-03-17 DIAGNOSIS — N390 Urinary tract infection, site not specified: Secondary | ICD-10-CM | POA: Insufficient documentation

## 2017-03-17 DIAGNOSIS — E785 Hyperlipidemia, unspecified: Secondary | ICD-10-CM | POA: Diagnosis not present

## 2017-03-17 DIAGNOSIS — I4891 Unspecified atrial fibrillation: Secondary | ICD-10-CM | POA: Insufficient documentation

## 2017-03-17 DIAGNOSIS — Z7982 Long term (current) use of aspirin: Secondary | ICD-10-CM | POA: Insufficient documentation

## 2017-03-17 DIAGNOSIS — R402441 Other coma, without documented Glasgow coma scale score, or with partial score reported, in the field [EMT or ambulance]: Secondary | ICD-10-CM | POA: Diagnosis not present

## 2017-03-17 DIAGNOSIS — R4182 Altered mental status, unspecified: Secondary | ICD-10-CM | POA: Diagnosis not present

## 2017-03-17 DIAGNOSIS — F4489 Other dissociative and conversion disorders: Secondary | ICD-10-CM | POA: Diagnosis not present

## 2017-03-17 LAB — COMPREHENSIVE METABOLIC PANEL
ALBUMIN: 4.2 g/dL (ref 3.5–5.0)
ALT: 17 U/L (ref 14–54)
ANION GAP: 11 (ref 5–15)
AST: 25 U/L (ref 15–41)
Alkaline Phosphatase: 59 U/L (ref 38–126)
BUN: 21 mg/dL — AB (ref 6–20)
CHLORIDE: 101 mmol/L (ref 101–111)
CO2: 25 mmol/L (ref 22–32)
Calcium: 9.7 mg/dL (ref 8.9–10.3)
Creatinine, Ser: 1.05 mg/dL — ABNORMAL HIGH (ref 0.44–1.00)
GFR calc Af Amer: 52 mL/min — ABNORMAL LOW (ref >60)
GFR, EST NON AFRICAN AMERICAN: 45 mL/min — AB (ref >60)
GLUCOSE: 100 mg/dL — AB (ref 65–99)
POTASSIUM: 3.9 mmol/L (ref 3.5–5.1)
Sodium: 137 mmol/L (ref 135–145)
Total Bilirubin: 0.7 mg/dL (ref 0.3–1.2)
Total Protein: 6.9 g/dL (ref 6.5–8.1)

## 2017-03-17 LAB — CBC
HEMATOCRIT: 39.5 % (ref 36.0–46.0)
HEMOGLOBIN: 13 g/dL (ref 12.0–15.0)
MCH: 31.1 pg (ref 26.0–34.0)
MCHC: 32.9 g/dL (ref 30.0–36.0)
MCV: 94.5 fL (ref 78.0–100.0)
Platelets: 289 10*3/uL (ref 150–400)
RBC: 4.18 MIL/uL (ref 3.87–5.11)
RDW: 13.4 % (ref 11.5–15.5)
WBC: 11.8 10*3/uL — AB (ref 4.0–10.5)

## 2017-03-17 LAB — URINALYSIS, MICROSCOPIC (REFLEX)

## 2017-03-17 LAB — URINALYSIS, ROUTINE W REFLEX MICROSCOPIC
BILIRUBIN URINE: NEGATIVE
Glucose, UA: NEGATIVE mg/dL
Ketones, ur: NEGATIVE mg/dL
NITRITE: NEGATIVE
Protein, ur: NEGATIVE mg/dL
SPECIFIC GRAVITY, URINE: 1.01 (ref 1.005–1.030)

## 2017-03-17 LAB — CBG MONITORING, ED: Glucose-Capillary: 96 mg/dL (ref 65–99)

## 2017-03-17 LAB — AMMONIA

## 2017-03-17 MED ORDER — DEXTROSE 5 % IV SOLN
1.0000 g | Freq: Once | INTRAVENOUS | Status: AC
  Administered 2017-03-17: 1 g via INTRAVENOUS
  Filled 2017-03-17: qty 10

## 2017-03-17 MED ORDER — CEPHALEXIN 500 MG PO CAPS: 500.0000 mg | ORAL_CAPSULE | Freq: Two times a day (BID) | ORAL | 0 refills | Status: AC

## 2017-03-17 MED ORDER — SODIUM CHLORIDE 0.9 % IV SOLN
1000.0000 mL | Freq: Once | INTRAVENOUS | Status: AC
  Administered 2017-03-17: 1000 mL via INTRAVENOUS

## 2017-03-17 NOTE — ED Notes (Signed)
Attempted to call [facility] to obtain more history on patients acute problem but no answer x 3.  Left message with nursing supervisor .

## 2017-03-17 NOTE — ED Notes (Signed)
Pt states that she takes medication to keep her BP up.  Was told by her doctor not to worry about high blood pressure. Pt was advised to mention her BP to her doctor the next time she goes.

## 2017-03-17 NOTE — ED Notes (Signed)
Per nursing supervisor patient began having symptoms around 1030 or 11 this morning. Reports she walked to beauty shop because she knew she had an appointment.  States at the beauty shop she was very confused and normally she would know the president, date, time.

## 2017-03-17 NOTE — ED Provider Notes (Signed)
  Physical Exam  BP (!) 185/83   Pulse 75   Temp 97.9 F (36.6 C) (Oral)   Resp 17   Ht  (1.676 m)   Wt 65.8 kg (145 lb)   SpO2 98%   BMI 23.40 kg/m   Physical Exam  ED Course  Procedures  3:37 PM- Sign out from Arthor Captain, PA-C  Per previous provider HPI [name] Love is a 83 y.o. female who presents for altered mental status. Patient lives at an independent living center and was walking to the beauty shop today. When she arrived to the staff noted that she seemed extremely confused which is very unlike her. Patient arrived unable to provide much history, she stated that the year was 2019 and the president was president Felix Pacini. Upon my evaluation the patient was unsure why she was here however was able to name the year, the place, and the current president. According to her nursing staff at her facility the patient is generally very independent and not confused. Her daughter is present at the visit and confirms this. Daughter also states that she was recently having orthostatic hypotension and had her blood pressure medications taken away and started on fludrocortisone acetate. Patient has been hypertensive here. According to up-to-date one of the side effects is supine hypertension. Patient states that she has had urinary frequency today. She denies any other significant neurologic abnormalities or unable to review systems due to altered mental status  AMS--Resolved TIA vs UTI?  4:13 PM- UA shows evidence of UTI. Given Rocephin. Culture sent.   Plan is to DC home with follow up to PCP. At time of discharge, Patient is in no acute distress. Vital Signs are stable. Patient is able to ambulate. Patient able to tolerate PO.          Audry Pili, PA-C 03/17/17 1756    Rolland Porter, MD 03/29/17 [phone]

## 2017-03-17 NOTE — ED Provider Notes (Signed)
[facility]-EMERGENCY DEPT Provider Note   CSN: [id number] Arrival date & time: 03/17/17  1200     History   Chief Complaint Chief Complaint  Patient presents with  . Altered Mental Status    HPI [name] Love is a 83 y.o. female who presents for altered mental status. Patient lives at an independent living center and was walking to the beauty shop today. When she arrived to the staff noted that she seemed extremely confused which is very unlike her. Patient arrived unable to provide much history, she stated that the year was 2019 and the president was president Felix Pacini. Upon my evaluation the patient was unsure why she was here however was able to name the year, the place, and the current president. According to her nursing staff at her facility the patient is generally very independent and not confused. Her daughter is present at the visit and confirms this. Daughter also states that she was recently having orthostatic hypotension and had her blood pressure medications taken away and started on fludrocortisone acetate. Patient has been hypertensive here. According to up-to-date one of the side effects is supine hypertension. Patient states that she has had urinary frequency today. She denies any other significant neurologic abnormalities or unable to review systems due to altered mental status  HPI  Past Medical History:  Diagnosis Date  . Atrial fibrillation (HCC)   . Carotid artery occlusion   . Constipation 02/04/2008  . Diverticulosis of colon   . Dizziness   . Esophageal reflux   . Hyperlipidemia   . Hypertension   . Thyroid disease   . Urticaria   . Varicose veins     Patient Active Problem List   Diagnosis Date Noted  . Carotid stenosis 03/09/2014  . Aftercare following surgery of the circulatory system 03/09/2014  . Aftercare following surgery of the circulatory system, NEC 03/08/2013  . Occlusion and stenosis of carotid artery without mention of cerebral infarction  02/04/2012    Past Surgical History:  Procedure Laterality Date  . APPENDECTOMY    . CAROTID ENDARTERECTOMY Left 1990    OB History    No data available       Home Medications    Prior to Admission medications   Medication Sig Start Date End Date Taking? Authorizing Provider  acetaminophen (TYLENOL) 500 MG tablet Take 500 mg by mouth every 6 (six) hours as needed for mild pain, moderate pain, fever or headache.   Yes [provider]  aspirin 81 MG tablet Take 81 mg by mouth daily.   Yes [provider]  fludrocortisone (FLORINEF) 0.1 MG tablet Take 0.1 mg by mouth daily. *Usually takes between 1000 and 1030* 03/13/17  Yes [provider]  levothyroxine (SYNTHROID, LEVOTHROID) 125 MCG tablet Take 125 mcg by mouth daily. 12/15/16  Yes [provider]  Multiple Vitamin (MULTIVITAMIN WITH MINERALS) TABS tablet Take 1 tablet by mouth daily.   Yes [provider]    Family History Family History  Problem Relation Age of Onset  . Heart attack Father   . Heart disease Father        After age 67  . Heart attack Brother   . Heart attack Brother   . Breast cancer Mother   . Hypertension Mother   . Varicose Veins Mother   . Cancer Sister        ? Breast    Social History Social History  Substance Use Topics  . Smoking status: Never Smoker  .  Smokeless tobacco: Never Used  . Alcohol use 0.0 oz/week     Comment: a glass of wine every so often per pt     Allergies   Patient has no known allergies.   Review of Systems Review of Systems   Physical Exam Updated Vital Signs BP (!) 190/82 (BP Location: Left Arm)   Pulse 84   Temp 97.6 F (36.4 C) (Oral)   Resp 18   Ht  (1.676 m)   Wt 65.8 kg (145 lb)   SpO2 97%   BMI 23.40 kg/m   Physical Exam  Constitutional: She is oriented to person, place, and time. She appears well-developed and well-nourished. No distress.  HENT:  Head: Normocephalic and atraumatic.  Eyes:  Conjunctivae are normal. No scleral icterus.  Neck: Normal range of motion.  Cardiovascular: Normal rate, regular rhythm and normal heart sounds.  Exam reveals no gallop and no friction rub.   No murmur heard. Pulmonary/Chest: Effort normal and breath sounds normal. No respiratory distress.  Abdominal: Soft. Bowel sounds are normal. She exhibits no distension and no mass. There is no tenderness. There is no guarding.  Neurological: She is alert and oriented to person, place, and time. She displays normal reflexes. No cranial nerve deficit or sensory deficit. She exhibits normal muscle tone. Coordination normal.  Skin: Skin is warm and dry. She is not diaphoretic.  Psychiatric: Her behavior is normal.  Nursing note and vitals reviewed.    ED Treatments / Results  Labs (all labs ordered are listed, but only abnormal results are displayed) Labs Reviewed  COMPREHENSIVE METABOLIC PANEL - Abnormal; Notable for the following:       Result Value   Glucose, Bld 100 (*)    BUN 21 (*)    Creatinine, Ser 1.05 (*)    GFR calc non Af Amer 45 (*)    GFR calc Af Amer 52 (*)    All other components within normal limits  CBC - Abnormal; Notable for the following:    WBC 11.8 (*)    All other components within normal limits  URINALYSIS, ROUTINE W REFLEX MICROSCOPIC - Abnormal; Notable for the following:    APPearance HAZY (*)    pH >9.0 (*)    Hgb urine dipstick TRACE (*)    Leukocytes, UA LARGE (*)    All other components within normal limits  AMMONIA - Abnormal; Notable for the following:    Ammonia <9 (*)    All other components within normal limits  URINALYSIS, MICROSCOPIC (REFLEX) - Abnormal; Notable for the following:    Bacteria, UA MANY (*)    Squamous Epithelial / LPF TOO NUMEROUS TO COUNT (*)    All other components within normal limits  URINE CULTURE  CBG MONITORING, ED  CBG MONITORING, ED    EKG  EKG Interpretation  Date/Time:  Tuesday March 17 2017 12:19:52  EDT Ventricular Rate:  78 PR Interval:    QRS Duration: 82 QT Interval:  407 QTC Calculation: 464 R Axis:   37 Text Interpretation:  Sinus rhythm Confirmed by Rolland Porter ([id number]) on 03/17/2017 3:25:32 PM       Radiology Dg Chest 2 View  Result Date: 03/17/2017 CLINICAL DATA:  Altered mental status. EXAM: CHEST  2 VIEW COMPARISON:  06/18/2010 FINDINGS: The heart size is normal. There is aortic atherosclerosis. No pleural effusion or edema. No airspace opacities. IMPRESSION: 1.  Aortic Atherosclerosis (ICD10-I70.0). 2. No acute findings. Electronically Signed   By: Veronda Prude.D.  On: 03/17/2017 15:03   Ct Head Wo Contrast  Result Date: 03/17/2017 CLINICAL DATA:  83 year old female with altered mental status, confusion beginning at 10:30 or levin a.m. today. EXAM: CT HEAD WITHOUT CONTRAST TECHNIQUE: Contiguous axial images were obtained from the base of the skull through the vertex without intravenous contrast. COMPARISON:  None. FINDINGS: Brain: Cerebral volume is within normal limits for age. No midline shift, ventriculomegaly, mass effect, evidence of mass lesion, intracranial hemorrhage or evidence of cortically based acute infarction. Patchy and confluent bilateral cerebral white matter hypodensity with some involvement of the deep white matter capsules. The deep gray matter nuclei, brainstem, and cerebellum appear spared. No cortical encephalomalacia identified. Vascular: Calcified atherosclerosis at the skull base. No suspicious intracranial vascular hyperdensity. Skull: Negative. Sinuses/Orbits: Clear. Other: No acute orbit or scalp soft tissue findings. IMPRESSION: 1. Moderately advanced cerebral white matter signal changes which are most commonly due to small vessel disease. 2. No acute cortically based infarct, or other acute intracranial abnormality identified. Electronically Signed   By: Odessa Fleming M.D.   On: 03/17/2017 14:57    Procedures Procedures (including critical care  time)  Medications Ordered in ED Medications  cefTRIAXone (ROCEPHIN) 1 g in dextrose 5 % 50 mL IVPB (not administered)  0.9 %  sodium chloride infusion (1,000 mLs Intravenous New Bag/Given 03/17/17 1335)     Initial Impression / Assessment and Plan / ED Course  I have reviewed the triage vital signs and the nursing notes.  Pertinent labs & imaging results that were available during my care of the patient were reviewed by me and considered in my medical decision making (see chart for details).     Patient with altered mental status which has improved with the [facility] visit. Her workup is still pending. Patient did tell her daughter that last night she had with sounds like a floater in her vision that has resolved. I have given sign out to PA [facility]  Final Clinical Impressions(s) / ED Diagnoses   Final diagnoses:  None    New Prescriptions New Prescriptions   No medications on file     Arthor Captain, PA-C 03/17/17 1656    Rolland Porter, MD 03/29/17 [phone]

## 2017-03-17 NOTE — ED Notes (Signed)
Attempted to call facility again to figure out if patient lives by herself and when the last time she was seen normal was.  Left message.  Will await call back.

## 2017-03-17 NOTE — Discharge Instructions (Signed)
Please read and follow all provided instructions.  Your diagnoses today include:  1. Urinary tract infection without hematuria, site unspecified     Tests performed today include: Urine test - suggests that you have an infection in your bladder Vital signs. See below for your results today.   Medications prescribed:  Take as prescribed  Home care instructions:  Follow any educational materials contained in this packet.  Follow-up instructions: Please follow-up with your primary care provider in 3 days if symptoms are not resolved for further evaluation of your symptoms.  Return instructions:  Please return to the Emergency Department if you experience worsening symptoms.  Return with fever, worsening pain, persistent vomiting, worsening pain in your back.  Please return if you have any other emergent concerns.  Additional Information:  Your vital signs today were: BP (!) 190/82 (BP Location: Left Arm)    Pulse 84    Temp 97.6 F (36.4 C) (Oral)    Resp 18    Ht  (1.676 m)    Wt 65.8 kg (145 lb)    SpO2 97%    BMI 23.40 kg/m  If your blood pressure (BP) was elevated above 135/85 this visit, please have this repeated by your doctor within one month. --------------

## 2017-03-17 NOTE — ED Notes (Signed)
Bed: WA13 Expected date:  Expected time:  Means of arrival:  Comments: Hold for RES 

## 2017-03-17 NOTE — ED Triage Notes (Signed)
Per EMS patient c/o altered mental status since yesterday.  Reports patient from [facility].

## 2017-03-18 LAB — URINE CULTURE

## 2017-03-20 DIAGNOSIS — N390 Urinary tract infection, site not specified: Secondary | ICD-10-CM | POA: Diagnosis not present

## 2017-03-20 DIAGNOSIS — I129 Hypertensive chronic kidney disease with stage 1 through stage 4 chronic kidney disease, or unspecified chronic kidney disease: Secondary | ICD-10-CM | POA: Diagnosis not present

## 2017-03-20 DIAGNOSIS — N183 Chronic kidney disease, stage 3 (moderate): Secondary | ICD-10-CM | POA: Diagnosis not present

## 2017-03-20 DIAGNOSIS — I951 Orthostatic hypotension: Secondary | ICD-10-CM | POA: Diagnosis not present

## 2017-03-31 ENCOUNTER — Encounter (HOSPITAL_COMMUNITY): Payer: Self-pay | Admitting: Emergency Medicine

## 2017-03-31 ENCOUNTER — Emergency Department (HOSPITAL_COMMUNITY): Payer: Medicare Other

## 2017-03-31 ENCOUNTER — Emergency Department (HOSPITAL_COMMUNITY)
Admission: EM | Admit: 2017-03-31 | Discharge: 2017-03-31 | Disposition: A | Discharge status: Skilled Nursing Facility | Payer: Medicare Other | Attending: Emergency Medicine | Admitting: Emergency Medicine

## 2017-03-31 DIAGNOSIS — W01190A Fall on same level from slipping, tripping and stumbling with subsequent striking against furniture, initial encounter: Secondary | ICD-10-CM | POA: Insufficient documentation

## 2017-03-31 DIAGNOSIS — S0990XA Unspecified injury of head, initial encounter: Secondary | ICD-10-CM

## 2017-03-31 DIAGNOSIS — Y939 Activity, unspecified: Secondary | ICD-10-CM | POA: Insufficient documentation

## 2017-03-31 DIAGNOSIS — Y999 Unspecified external cause status: Secondary | ICD-10-CM | POA: Diagnosis not present

## 2017-03-31 DIAGNOSIS — R42 Dizziness and giddiness: Secondary | ICD-10-CM | POA: Diagnosis not present

## 2017-03-31 DIAGNOSIS — S199XXA Unspecified injury of neck, initial encounter: Secondary | ICD-10-CM | POA: Diagnosis not present

## 2017-03-31 DIAGNOSIS — Z7982 Long term (current) use of aspirin: Secondary | ICD-10-CM | POA: Insufficient documentation

## 2017-03-31 DIAGNOSIS — E079 Disorder of thyroid, unspecified: Secondary | ICD-10-CM | POA: Diagnosis not present

## 2017-03-31 DIAGNOSIS — Y92129 Unspecified place in nursing home as the place of occurrence of the external cause: Secondary | ICD-10-CM | POA: Insufficient documentation

## 2017-03-31 DIAGNOSIS — R55 Syncope and collapse: Secondary | ICD-10-CM

## 2017-03-31 DIAGNOSIS — Z79899 Other long term (current) drug therapy: Secondary | ICD-10-CM | POA: Diagnosis not present

## 2017-03-31 DIAGNOSIS — R404 Transient alteration of awareness: Secondary | ICD-10-CM | POA: Diagnosis not present

## 2017-03-31 LAB — CBC
HCT: 37 % (ref 36.0–46.0)
Hemoglobin: 12.2 g/dL (ref 12.0–15.0)
MCH: 31.3 pg (ref 26.0–34.0)
MCHC: 33 g/dL (ref 30.0–36.0)
MCV: 94.9 fL (ref 78.0–100.0)
PLATELETS: 274 10*3/uL (ref 150–400)
RBC: 3.9 MIL/uL (ref 3.87–5.11)
RDW: 13.4 % (ref 11.5–15.5)
WBC: 9.3 10*3/uL (ref 4.0–10.5)

## 2017-03-31 LAB — COMPREHENSIVE METABOLIC PANEL
ALK PHOS: 59 U/L (ref 38–126)
ALT: 17 U/L (ref 14–54)
AST: 23 U/L (ref 15–41)
Albumin: 4.1 g/dL (ref 3.5–5.0)
Anion gap: 10 (ref 5–15)
BILIRUBIN TOTAL: 0.6 mg/dL (ref 0.3–1.2)
BUN: 22 mg/dL — AB (ref 6–20)
CALCIUM: 10.2 mg/dL (ref 8.9–10.3)
CHLORIDE: 103 mmol/L (ref 101–111)
CO2: 27 mmol/L (ref 22–32)
CREATININE: 0.99 mg/dL (ref 0.44–1.00)
GFR calc Af Amer: 56 mL/min — ABNORMAL LOW (ref >60)
GFR, EST NON AFRICAN AMERICAN: 48 mL/min — AB (ref >60)
Glucose, Bld: 103 mg/dL — ABNORMAL HIGH (ref 65–99)
Potassium: 4.1 mmol/L (ref 3.5–5.1)
Sodium: 140 mmol/L (ref 135–145)
Total Protein: 6.8 g/dL (ref 6.5–8.1)

## 2017-03-31 MED ORDER — DEXAMETHASONE SODIUM PHOSPHATE 10 MG/ML IJ SOLN: 6.0000 mg | Freq: Once | INTRAMUSCULAR | Status: DC

## 2017-03-31 NOTE — ED Provider Notes (Signed)
[name]-EMERGENCY DEPT Provider Note   CSN: [id number] Arrival date & time: 03/31/17  1212     History   Chief Complaint Chief Complaint  Patient presents with  . Loss of Consciousness    HPI [name] Love is a 83 y.o. female.  83yo F w/ PMH including A fib, carotid endarterectomy, HTN, HLD who p/w near syncope.  Today the patient was at her assisted living facility having her blood pressure checked.  She stood up after having it checked and felt lightheaded so she reached around to grab the arm of a chair and sit back down.  She missed the chair and fell backwards, striking the back of her head on a side table.  She states she did not lose consciousness although bystanders report possible brief loss of consciousness.  She states that she feels fine and has not had any nausea, vomiting, visual changes, or extremity numbness, weakness, or pain.  She denies anticoagulant use aside from aspirin.  Daughter notes that she has been diagnosed by PCP with orthostatic hypotension, and over the past few months she has had medication adjustments including discontinuing 2 antihypertensives and starting a medication to raise her blood pressure.   The history is provided by the patient.  Loss of Consciousness      Past Medical History:  Diagnosis Date  . Atrial fibrillation (HCC)   . Carotid artery occlusion   . Constipation 02/04/2008  . Diverticulosis of colon   . Dizziness   . Esophageal reflux   . Hyperlipidemia   . Hypertension   . Thyroid disease   . Urticaria   . Varicose veins     Patient Active Problem List   Diagnosis Date Noted  . Carotid stenosis 03/09/2014  . Aftercare following surgery of the circulatory system 03/09/2014  . Aftercare following surgery of the circulatory system, NEC 03/08/2013  . Occlusion and stenosis of carotid artery without mention of cerebral infarction 02/04/2012    Past Surgical History:  Procedure Laterality Date  .  APPENDECTOMY    . CAROTID ENDARTERECTOMY Left 1990    OB History    No data available       Home Medications    Prior to Admission medications   Medication Sig Start Date End Date Taking? Authorizing Provider  acetaminophen (TYLENOL) 500 MG tablet Take 500 mg by mouth every 6 (six) hours as needed for mild pain, moderate pain, fever or headache.   Yes [provider]  aspirin 81 MG tablet Take 81 mg by mouth daily.   Yes [provider]  Calcium Carbonate-Vitamin D (OSCAL 500/200 D-3 PO) Take 1 tablet by mouth daily.   Yes [provider]  fludrocortisone (FLORINEF) 0.1 MG tablet Take 0.1 mg by mouth daily. *Usually takes between 1000 and 1030* 03/13/17  Yes [provider]  levothyroxine (SYNTHROID, LEVOTHROID) 125 MCG tablet Take 125 mcg by mouth daily. 12/15/16  Yes [provider]  meloxicam (MOBIC) 7.5 MG tablet Take 7.5 mg by mouth daily. 03/20/17  Yes [provider]  Multiple Vitamin (MULTIVITAMIN WITH MINERALS) TABS tablet Take 1 tablet by mouth daily.   Yes [provider]  Saccharomyces boulardii (FLORASTOR PO) Take 1 tablet by mouth daily.   Yes [provider]  simvastatin (ZOCOR) 20 MG tablet Take 20 mg by mouth daily.   Yes [provider]  cephALEXin (KEFLEX) 500 MG capsule Take 500 mg by mouth 2 (two) times daily.    [provider]    Family History Family History  Problem Relation Age of Onset  . Heart attack Father   . Heart disease Father        After age 49  . Heart attack Brother   . Heart attack Brother   . Breast cancer Mother   . Hypertension Mother   . Varicose Veins Mother   . Cancer Sister        ? Breast    Social History Social History  Substance Use Topics  . Smoking status: Never Smoker  . Smokeless tobacco: Never Used  . Alcohol use 0.0 oz/week     Comment: a glass of wine every so often per pt     Allergies   Patient has no known  allergies.   Review of Systems Review of Systems  Cardiovascular: Positive for syncope.   All other systems reviewed and are negative except that which was mentioned in HPI   Physical Exam Updated Vital Signs BP (!) 173/81 (BP Location: Left Arm)   Pulse 74   Temp 98.1 F (36.7 C) (Oral)   Resp 16   Ht 5\' 6"  (1.676 m)   Wt 68 kg (150 lb)   SpO2 97%   BMI 24.21 kg/m   Physical Exam  Constitutional: She is oriented to person, place, and time. She appears well-developed and well-nourished. No distress.  HENT:  Head: Normocephalic and atraumatic.  Moist mucous membranes  Eyes: Pupils are equal, round, and reactive to light. Conjunctivae and EOM are normal.  Neck: Neck supple.  No midline cervical spinal tenderness  Cardiovascular: Normal rate and regular rhythm.   Murmur heard. Pulmonary/Chest: Effort normal and breath sounds normal. She exhibits no tenderness.  Abdominal: Soft. Bowel sounds are normal. She exhibits no distension. There is no tenderness.  Musculoskeletal: She exhibits no edema or tenderness.  Neurological: She is alert and oriented to person, place, and time. No cranial nerve deficit.  5/5 strength x all 4 extremities, Fluent speech  Skin: Skin is warm and dry.  Psychiatric: She has a normal mood and affect. Judgment normal.  Nursing note and vitals reviewed.    ED Treatments / Results  Labs (all labs ordered are listed, but only abnormal results are displayed) Labs Reviewed  COMPREHENSIVE METABOLIC PANEL - Abnormal; Notable for the following:       Result Value   Glucose, Bld 103 (*)    BUN 22 (*)    GFR calc non Af Amer 48 (*)    GFR calc Af Amer 56 (*)    All other components within normal limits  CBC    EKG  EKG Interpretation  Date/Time:  Tuesday March 31 2017 12:34:20 EDT Ventricular Rate:  77 PR Interval:    QRS Duration: 81 QT Interval:  400 QTC Calculation: 453 R Axis:   63 Text Interpretation:  Ectopic atrial rhythm Short PR  interval abnormal P wave morphology compared to previous, ST segments similar Confirmed by Frederick Peers [phone]) on 03/31/2017 12:50:59 PM       Radiology Ct Head Wo Contrast  Result Date: 03/31/2017 CLINICAL DATA:  Near syncopal episode hit back of head EXAM: CT HEAD WITHOUT CONTRAST CT CERVICAL SPINE WITHOUT CONTRAST TECHNIQUE: Multidetector CT imaging of the head and cervical spine was performed following the standard protocol without intravenous contrast. Multiplanar CT image reconstructions of the cervical spine were also generated. COMPARISON:  03/17/2017 FINDINGS: CT HEAD FINDINGS Brain: No acute territorial infarction, hemorrhage or intracranial mass is visualized.  Moderate atrophy and small vessel ischemic changes of the white matter. Old lacunar infarct in the right basal ganglia. Stable ventricle size. Vascular: No hyperdense vessels.  Carotid artery calcifications. Skull: No fracture or suspicious bone lesion. Sinuses/Orbits: No acute finding. Other: Mild left posterior scalp swelling CT CERVICAL SPINE FINDINGS Alignment: Reversal of cervical lordosis. Facet alignment is within normal limits. Skull base and vertebrae: No acute fracture. No primary bone lesion or focal pathologic process. Soft tissues and spinal canal: No prevertebral fluid or swelling. No visible canal hematoma. Disc levels: Moderate-to-marked diffuse degenerative changes from C3 through C7. Posterior disc osteophyte complex C3 through C6. Multiple foraminal stenosis of the mid to lower cervical spine. Upper chest: Biapical fibrosis.  Dense carotid artery calcification Other: None IMPRESSION: 1. No CT evidence for acute intracranial abnormality. Atrophy and small vessel ischemic changes of the white matter 2. Reversal of cervical lordosis with degenerative changes. No acute displaced fracture is seen. Electronically Signed   By: Jasmine Pang M.D.   On: 03/31/2017 15:00   Ct Cervical Spine Wo Contrast  Result Date:  03/31/2017 CLINICAL DATA:  Near syncopal episode hit back of head EXAM: CT HEAD WITHOUT CONTRAST CT CERVICAL SPINE WITHOUT CONTRAST TECHNIQUE: Multidetector CT imaging of the head and cervical spine was performed following the standard protocol without intravenous contrast. Multiplanar CT image reconstructions of the cervical spine were also generated. COMPARISON:  03/17/2017 FINDINGS: CT HEAD FINDINGS Brain: No acute territorial infarction, hemorrhage or intracranial mass is visualized. Moderate atrophy and small vessel ischemic changes of the white matter. Old lacunar infarct in the right basal ganglia. Stable ventricle size. Vascular: No hyperdense vessels.  Carotid artery calcifications. Skull: No fracture or suspicious bone lesion. Sinuses/Orbits: No acute finding. Other: Mild left posterior scalp swelling CT CERVICAL SPINE FINDINGS Alignment: Reversal of cervical lordosis. Facet alignment is within normal limits. Skull base and vertebrae: No acute fracture. No primary bone lesion or focal pathologic process. Soft tissues and spinal canal: No prevertebral fluid or swelling. No visible canal hematoma. Disc levels: Moderate-to-marked diffuse degenerative changes from C3 through C7. Posterior disc osteophyte complex C3 through C6. Multiple foraminal stenosis of the mid to lower cervical spine. Upper chest: Biapical fibrosis.  Dense carotid artery calcification Other: None IMPRESSION: 1. No CT evidence for acute intracranial abnormality. Atrophy and small vessel ischemic changes of the white matter 2. Reversal of cervical lordosis with degenerative changes. No acute displaced fracture is seen. Electronically Signed   By: Jasmine Pang M.D.   On: 03/31/2017 15:00    Procedures Procedures (including critical care time)  Medications Ordered in ED Medications - No data to display   Initial Impression / Assessment and Plan / ED Course  I have reviewed the triage vital signs and the nursing  notes.  Pertinent labs & imaging results that were available during my care of the patient were reviewed by me and considered in my medical decision making (see chart for details).     Pt w/ near syncopal episode after she became lightheaded with standing this morning.  She does have history of orthostatic hypotension and recent discontinuation of antihypertensives because of this problem.  She was well-appearing, neurologically intact on exam, BP 171/89.  She had no focal areas of pain. CT head and c-spine negative for acute injury.  EKG reassuring.  Labs show no evidence of dehydration and normal blood counts.  Patient has ambulated here without problems and her blood pressure remains elevated without any problems with hypotension.  Given  her recent history of blood pressure problems with recent addition of medication to maintain blood pressure, I do not feel she currently needs admission for her near syncopal episode but I have recommended follow-up with her PCP, continuation of her current medications, and good hydration.  Extensively reviewed return precautions with patient and daughter and patient discharged in satisfactory condition.  Final Clinical Impressions(s) / ED Diagnoses   Final diagnoses:  Near syncope  Closed head injury, initial encounter    New Prescriptions New Prescriptions   No medications on file     Maigen Mozingo, Ambrose Finland, MD 03/31/17 1711

## 2017-03-31 NOTE — ED Notes (Signed)
Bed: WA24 Expected date:  Expected time:  Means of arrival:  Comments: EMS-fall 

## 2017-03-31 NOTE — ED Triage Notes (Addendum)
Per EMS. Pt from [facility]. Pt had witnessed near-syncopal episode and fall after having her BP taken. Pt felt lightheaded when she stood up. Reached for chair arm, but missed the seat when she fell. Pt fell backwards and hit the back of her head. No lacerations. Pt not on blood thinners.

## 2017-04-06 DIAGNOSIS — F419 Anxiety disorder, unspecified: Secondary | ICD-10-CM | POA: Diagnosis not present

## 2017-04-06 DIAGNOSIS — I951 Orthostatic hypotension: Secondary | ICD-10-CM | POA: Diagnosis not present

## 2017-04-14 ENCOUNTER — Ambulatory Visit (INDEPENDENT_AMBULATORY_CARE_PROVIDER_SITE_OTHER): Payer: Medicare Other | Admitting: Family

## 2017-04-14 ENCOUNTER — Ambulatory Visit (HOSPITAL_COMMUNITY)
Admission: RE | Admit: 2017-04-14 | Discharge: 2017-04-14 | Disposition: A | Discharge status: Home / Self Care | Payer: Medicare Other | Source: Ambulatory Visit | Attending: Vascular Surgery | Admitting: Vascular Surgery

## 2017-04-14 ENCOUNTER — Encounter: Payer: Self-pay | Admitting: Family

## 2017-04-14 VITALS — BP 175/93 | HR 70 | Temp 97.4°F | Resp 20 | Ht 66.0 in | Wt 146.0 lb

## 2017-04-14 DIAGNOSIS — I6522 Occlusion and stenosis of left carotid artery: Secondary | ICD-10-CM | POA: Diagnosis not present

## 2017-04-14 DIAGNOSIS — Z9889 Other specified postprocedural states: Secondary | ICD-10-CM | POA: Diagnosis not present

## 2017-04-14 DIAGNOSIS — I6523 Occlusion and stenosis of bilateral carotid arteries: Secondary | ICD-10-CM | POA: Insufficient documentation

## 2017-04-14 LAB — VAS US CAROTID
LCCADDIAS: -17 cm/s
LEFT ECA DIAS: -11 cm/s
LEFT VERTEBRAL DIAS: 13 cm/s
LICADDIAS: -21 cm/s
LICAPDIAS: -6 cm/s
LICAPSYS: -28 cm/s
Left CCA dist sys: -95 cm/s
Left CCA prox dias: 17 cm/s
Left CCA prox sys: 95 cm/s
Left ICA dist sys: -95 cm/s
RIGHT CCA MID DIAS: 13 cm/s
RIGHT ECA DIAS: -7 cm/s
RIGHT VERTEBRAL DIAS: 18 cm/s
Right CCA prox dias: 14 cm/s
Right CCA prox sys: 48 cm/s
Right cca dist sys: -99 cm/s

## 2017-04-14 NOTE — Progress Notes (Signed)
Chief Complaint: Follow up Extracranial Carotid Artery Stenosis   History of Present Illness  [name] MirzaMary J Markman is a 83 y.o. female who had undergone left carotid endarte[place] in the early 1990's [place] Dr. Arbie CookeyEarly has been monitoring her extracranial carotid arteries since about 2003.  She returns today for follow up. Patient reports possible mild stroke before her left CEA, but denies stroke or TIA symptoms since then. She denies a history of amaurosis fugax or monocular blindness, unilateral facial drooping, hemiplegia, or receptive or expressive aphasia.  She denies claudication symptoms with walking, denies non-healing ulcers, denies dyspnea, denies chest pain.  Her PCP is Dr. Merlene LaughterHal Stoneking.   Daughter states pt was recently diagnosed with orthostatic hypotension, was prescribed Florinef for this. She ran out of this several days. At home today daughter states her blood pressure was about the same as in our office today.  Was seen in the ED a week ago for feeling light headed, sitting, missing the chair, and hitting her head.  Due to hypotension her PCP stopped two of her antihypertensive medications, according to daughter.   She was receiving therapy for positional vertigo.   Gout was diagnosed in the base of her left great toe, states this is not bothering her much.   Pt Diabetic: No Pt smoker: non-smoker  Pt meds include:  Statin : Yes  Betablocker: Yes  ASA: Yes  Other anticoagulants/antiplatelets: none   Past Medical History:  Diagnosis Date  . Atrial fibrillation (HCC)   . Carotid artery occlusion   . Constipation 02/04/2008  . Diverticulosis of colon   . Dizziness   . Esophageal reflux   . Hyperlipidemia   . Hypertension   . Thyroid disease   . Urticaria   . Varicose veins     Social History Social History   Tobacco Use  . Smoking status: Never Smoker  . Smokeless tobacco: Never Used  Substance Use Topics  . Alcohol use: Yes     Alcohol/week: 0.0 oz    Comment: a glass of wine every so often per pt  . Drug use: No    Family History Family History  Problem Relation Age of Onset  . Heart attack Father   . Heart disease Father        After age 560  . Heart attack Brother   . Heart attack Brother   . Breast cancer Mother   . Hypertension Mother   . Varicose Veins Mother   . Cancer Sister        ? Breast    Surgical History Past Surgical History:  Procedure Laterality Date  . APPENDECTOMY    . CAROTID ENDARTERECTOMY Left 1990    No Known Allergies  Current Outpatient Medications  Medication Sig Dispense Refill  . acetaminophen (TYLENOL) 500 MG tablet Take 500 mg by mouth every 6 (six) hours as needed for mild pain, moderate pain, fever or headache.    Marland Kitchen. aspirin 81 MG tablet Take 81 mg by mouth daily.    . Calcium Carbonate-Vitamin D (OSCAL 500/200 D-3 PO) Take 1 tablet by mouth daily.    . cephALEXin (KEFLEX) 500 MG capsule Take 500 mg by mouth 2 (two) times daily.    . fludrocortisone (FLORINEF) 0.1 MG tablet Take 0.1 mg by mouth daily. *Usually takes between 1000 and 1030*  2  . levothyroxine (SYNTHROID, LEVOTHROID) 125 MCG tablet Take 125 mcg by mouth daily.  3  . meloxicam (MOBIC) 7.5 MG tablet Take 7.5  mg by mouth daily.  0  . Multiple Vitamin (MULTIVITAMIN WITH MINERALS) TABS tablet Take 1 tablet by mouth daily.    . Saccharomyces boulardii (FLORASTOR PO) Take 1 tablet by mouth daily.    . simvastatin (ZOCOR) 20 MG tablet Take 20 mg by mouth daily.     No current facility-administered medications for this visit.     Review of Systems : See HPI for pertinent positives and negatives.  Physical Examination  Vitals:   04/14/17 1516 04/14/17 1517  BP: (!) 158/88 (!) 175/93  Pulse: 70   Resp: 20   Temp: (!) 97.4 F (36.3 C)   TempSrc: Oral   SpO2: 98%   Weight: 146 lb (66.2 kg)   Height: 5\' 6"  (1.676 m)    Body mass index is 23.57 kg/m.  General: WDWN elderly female in NAD   GAIT:normal  Eyes: PERRLA  Pulmonary: Respirations are non labored, CTAB, good air movement in all fields.  Cardiac: regular rhythm, + systolic murmur  VASCULAR EXAM Carotid Bruits Left Right   Transmitted cardiac murmur Transmitted cardiac murmur   Abdominal aortic pulse is mildly palpable.  Bilateral radial pulses are 2+ palpable.   LE Pulses  LEFT  RIGHT   POPLITEAL  not palpable  not palpable  POSTERIOR TIBIAL  not palpable  not palpable   DORSALIS PEDIS ANTERIOR TIBIAL  palpable  palpable    Gastrointestinal: soft, nontender, BS WNL, no r/g, no palpated masses.  Musculoskeletal: Negative muscle atrophy/wasting. M/S 5/5 throughout, Extremities without ischemic changes.  Neurologic: A&O X 3; appropriate affect, speech is normal, CN 2-12 intact except use of hearing aids in both ears, Pain and light touch intact in extremities, Motor exam as listed above     Assessment: [name] Love is a 84 y.o. female who is s/pleft carotid endarterectomy in the early 1990's in [place], [place]. Dr. Arbie Cookey has been monitoring her extracranial carotid arteries since about 2003.  Patient reports possible mild stroke before her left CEA, but denies stroke or TIA symptoms since then. I advised pt's daughter to notify pt's PCP of her elevated blood pressure, since Florinef was prescribed for orthostatic hypotension.   DATA Carotid Duplex (04/14/17): 1-39%right ICA stenosis. Proximal right CCA stenosis <50%. Left carotid endarterectomy site with mild hyperplasia in the surgical bulb at 1-39% stenosis.  Bilateral vertebral arteries are antegrade.  Bilateral subclavian arteries are multiphasic.  No significant change in comparison to the exams on 03/28/15 and 04/08/16.     Plan: Follow-up in 1 year with Carotid Duplex scan.   I discussed in depth with the patient the nature of atherosclerosis, and emphasized the importance of maximal medical management  including strict control of blood pressure, blood glucose, and lipid levels, obtaining regular exercise, and continued cessation of smoking.  The patient is aware that without maximal medical management the underlying atherosclerotic disease process will progress, limiting the benefit of any interventions. The patient was given information about stroke prevention and what symptoms should prompt the patient to seek immediate medical care. Thank you for allowing [place] to participate in this patient's care.  Charisse March, RN, MSN, FNP-C [facility] Office: [phone]  Clinic Physician: Early  04/14/17 3:19 PM

## 2017-04-14 NOTE — Patient Instructions (Signed)

## 2017-04-21 NOTE — Addendum Note (Signed)
Addended by: Storey Stangeland A on: 04/21/2017 03:35 PM   Modules accepted: Orders  

## 2017-05-07 DIAGNOSIS — I951 Orthostatic hypotension: Secondary | ICD-10-CM | POA: Diagnosis not present

## 2017-05-14 DIAGNOSIS — I951 Orthostatic hypotension: Secondary | ICD-10-CM | POA: Diagnosis not present

## 2017-05-25 DIAGNOSIS — N183 Chronic kidney disease, stage 3 (moderate): Secondary | ICD-10-CM | POA: Diagnosis not present

## 2017-05-25 DIAGNOSIS — I129 Hypertensive chronic kidney disease with stage 1 through stage 4 chronic kidney disease, or unspecified chronic kidney disease: Secondary | ICD-10-CM | POA: Diagnosis not present

## 2017-05-25 DIAGNOSIS — I951 Orthostatic hypotension: Secondary | ICD-10-CM | POA: Diagnosis not present

## 2017-05-25 DIAGNOSIS — M545 Low back pain: Secondary | ICD-10-CM | POA: Diagnosis not present

## 2017-07-15 DIAGNOSIS — Z961 Presence of intraocular lens: Secondary | ICD-10-CM | POA: Diagnosis not present

## 2017-10-08 DIAGNOSIS — E039 Hypothyroidism, unspecified: Secondary | ICD-10-CM | POA: Diagnosis not present

## 2017-10-08 DIAGNOSIS — I129 Hypertensive chronic kidney disease with stage 1 through stage 4 chronic kidney disease, or unspecified chronic kidney disease: Secondary | ICD-10-CM | POA: Diagnosis not present

## 2017-10-08 DIAGNOSIS — Z0000 Encounter for general adult medical examination without abnormal findings: Secondary | ICD-10-CM | POA: Diagnosis not present

## 2017-10-08 DIAGNOSIS — N183 Chronic kidney disease, stage 3 (moderate): Secondary | ICD-10-CM | POA: Diagnosis not present

## 2017-10-14 DIAGNOSIS — M10062 Idiopathic gout, left knee: Secondary | ICD-10-CM | POA: Diagnosis not present

## 2017-10-14 DIAGNOSIS — N183 Chronic kidney disease, stage 3 (moderate): Secondary | ICD-10-CM | POA: Diagnosis not present

## 2017-10-14 DIAGNOSIS — I129 Hypertensive chronic kidney disease with stage 1 through stage 4 chronic kidney disease, or unspecified chronic kidney disease: Secondary | ICD-10-CM | POA: Diagnosis not present

## 2017-10-16 DIAGNOSIS — N183 Chronic kidney disease, stage 3 (moderate): Secondary | ICD-10-CM | POA: Diagnosis not present

## 2017-10-16 DIAGNOSIS — R278 Other lack of coordination: Secondary | ICD-10-CM | POA: Diagnosis not present

## 2017-10-16 DIAGNOSIS — M25562 Pain in left knee: Secondary | ICD-10-CM | POA: Diagnosis not present

## 2017-10-16 DIAGNOSIS — I129 Hypertensive chronic kidney disease with stage 1 through stage 4 chronic kidney disease, or unspecified chronic kidney disease: Secondary | ICD-10-CM | POA: Diagnosis not present

## 2017-10-16 DIAGNOSIS — M6281 Muscle weakness (generalized): Secondary | ICD-10-CM | POA: Diagnosis not present

## 2017-10-16 DIAGNOSIS — R2681 Unsteadiness on feet: Secondary | ICD-10-CM | POA: Diagnosis not present

## 2017-10-16 DIAGNOSIS — M10062 Idiopathic gout, left knee: Secondary | ICD-10-CM | POA: Diagnosis not present

## 2017-10-22 DIAGNOSIS — M25562 Pain in left knee: Secondary | ICD-10-CM | POA: Diagnosis not present

## 2017-10-22 DIAGNOSIS — R2681 Unsteadiness on feet: Secondary | ICD-10-CM | POA: Diagnosis not present

## 2017-10-22 DIAGNOSIS — R278 Other lack of coordination: Secondary | ICD-10-CM | POA: Diagnosis not present

## 2017-10-22 DIAGNOSIS — M6281 Muscle weakness (generalized): Secondary | ICD-10-CM | POA: Diagnosis not present

## 2017-10-29 DIAGNOSIS — M25562 Pain in left knee: Secondary | ICD-10-CM | POA: Diagnosis not present

## 2017-10-29 DIAGNOSIS — M6281 Muscle weakness (generalized): Secondary | ICD-10-CM | POA: Diagnosis not present

## 2017-10-29 DIAGNOSIS — R278 Other lack of coordination: Secondary | ICD-10-CM | POA: Diagnosis not present

## 2017-10-29 DIAGNOSIS — R2681 Unsteadiness on feet: Secondary | ICD-10-CM | POA: Diagnosis not present

## 2017-11-05 DIAGNOSIS — M6281 Muscle weakness (generalized): Secondary | ICD-10-CM | POA: Diagnosis not present

## 2017-11-05 DIAGNOSIS — M25562 Pain in left knee: Secondary | ICD-10-CM | POA: Diagnosis not present

## 2017-11-05 DIAGNOSIS — R2681 Unsteadiness on feet: Secondary | ICD-10-CM | POA: Diagnosis not present

## 2017-11-05 DIAGNOSIS — R278 Other lack of coordination: Secondary | ICD-10-CM | POA: Diagnosis not present

## 2017-11-11 DIAGNOSIS — M25562 Pain in left knee: Secondary | ICD-10-CM | POA: Diagnosis not present

## 2017-11-11 DIAGNOSIS — M6281 Muscle weakness (generalized): Secondary | ICD-10-CM | POA: Diagnosis not present

## 2017-11-11 DIAGNOSIS — R2681 Unsteadiness on feet: Secondary | ICD-10-CM | POA: Diagnosis not present

## 2017-11-11 DIAGNOSIS — R278 Other lack of coordination: Secondary | ICD-10-CM | POA: Diagnosis not present

## 2017-12-17 ENCOUNTER — Ambulatory Visit (HOSPITAL_COMMUNITY)
Admission: RE | Admit: 2017-12-17 | Discharge: 2017-12-17 | Disposition: A | Discharge status: Home / Self Care | Payer: Medicare Other | Source: Ambulatory Visit | Attending: Geriatric Medicine | Admitting: Geriatric Medicine

## 2017-12-17 ENCOUNTER — Other Ambulatory Visit (HOSPITAL_COMMUNITY): Payer: Self-pay | Admitting: Geriatric Medicine

## 2017-12-17 DIAGNOSIS — M79604 Pain in right leg: Secondary | ICD-10-CM | POA: Insufficient documentation

## 2017-12-17 DIAGNOSIS — M7989 Other specified soft tissue disorders: Secondary | ICD-10-CM | POA: Diagnosis not present

## 2017-12-17 DIAGNOSIS — N183 Chronic kidney disease, stage 3 (moderate): Secondary | ICD-10-CM | POA: Diagnosis not present

## 2017-12-17 DIAGNOSIS — I129 Hypertensive chronic kidney disease with stage 1 through stage 4 chronic kidney disease, or unspecified chronic kidney disease: Secondary | ICD-10-CM | POA: Diagnosis not present

## 2017-12-17 NOTE — Progress Notes (Addendum)
Preliminary notes--Right lower extremity venous duplex exam completed. Negative for DVT. There is a 2.46x1.63x5.14cm cystic structure with complex fluid collection seen at the medial portion of popliteal fossa. Severe edema noted.  Result called Dr. Pete GlatterStoneking and patient was instructed to follow up the regular office visit. Hongying Masson Nalepa (RDMS RVT) 12/17/17 2:31 PM

## 2017-12-18 DIAGNOSIS — I951 Orthostatic hypotension: Secondary | ICD-10-CM | POA: Diagnosis not present

## 2017-12-18 DIAGNOSIS — M6281 Muscle weakness (generalized): Secondary | ICD-10-CM | POA: Diagnosis not present

## 2017-12-18 DIAGNOSIS — R600 Localized edema: Secondary | ICD-10-CM | POA: Diagnosis not present

## 2017-12-18 DIAGNOSIS — I872 Venous insufficiency (chronic) (peripheral): Secondary | ICD-10-CM | POA: Diagnosis not present

## 2017-12-21 DIAGNOSIS — I951 Orthostatic hypotension: Secondary | ICD-10-CM | POA: Diagnosis not present

## 2017-12-21 DIAGNOSIS — I872 Venous insufficiency (chronic) (peripheral): Secondary | ICD-10-CM | POA: Diagnosis not present

## 2017-12-21 DIAGNOSIS — M6281 Muscle weakness (generalized): Secondary | ICD-10-CM | POA: Diagnosis not present

## 2017-12-21 DIAGNOSIS — R600 Localized edema: Secondary | ICD-10-CM | POA: Diagnosis not present

## 2018-02-17 DIAGNOSIS — I129 Hypertensive chronic kidney disease with stage 1 through stage 4 chronic kidney disease, or unspecified chronic kidney disease: Secondary | ICD-10-CM | POA: Diagnosis not present

## 2018-02-17 DIAGNOSIS — R351 Nocturia: Secondary | ICD-10-CM | POA: Diagnosis not present

## 2018-02-17 DIAGNOSIS — I951 Orthostatic hypotension: Secondary | ICD-10-CM | POA: Diagnosis not present

## 2018-02-17 DIAGNOSIS — N183 Chronic kidney disease, stage 3 (moderate): Secondary | ICD-10-CM | POA: Diagnosis not present

## 2018-03-19 ENCOUNTER — Emergency Department (HOSPITAL_COMMUNITY)
Admission: EM | Admit: 2018-03-19 | Discharge: 2018-03-19 | Disposition: A | Discharge status: Home / Self Care | Payer: Medicare Other | Attending: Emergency Medicine | Admitting: Emergency Medicine

## 2018-03-19 ENCOUNTER — Encounter (HOSPITAL_COMMUNITY): Payer: Self-pay | Admitting: Emergency Medicine

## 2018-03-19 DIAGNOSIS — Z7982 Long term (current) use of aspirin: Secondary | ICD-10-CM | POA: Insufficient documentation

## 2018-03-19 DIAGNOSIS — I10 Essential (primary) hypertension: Secondary | ICD-10-CM | POA: Insufficient documentation

## 2018-03-19 DIAGNOSIS — R0902 Hypoxemia: Secondary | ICD-10-CM | POA: Diagnosis not present

## 2018-03-19 DIAGNOSIS — R55 Syncope and collapse: Secondary | ICD-10-CM | POA: Diagnosis not present

## 2018-03-19 DIAGNOSIS — Z79899 Other long term (current) drug therapy: Secondary | ICD-10-CM | POA: Insufficient documentation

## 2018-03-19 LAB — CBC
HCT: 37.1 % (ref 36.0–46.0)
Hemoglobin: 11.8 g/dL — ABNORMAL LOW (ref 12.0–15.0)
MCH: 29.9 pg (ref 26.0–34.0)
MCHC: 31.8 g/dL (ref 30.0–36.0)
MCV: 94.2 fL (ref 80.0–100.0)
NRBC: 0 % (ref 0.0–0.2)
Platelets: 242 10*3/uL (ref 150–400)
RBC: 3.94 MIL/uL (ref 3.87–5.11)
RDW: 14.1 % (ref 11.5–15.5)
WBC: 12.9 10*3/uL — ABNORMAL HIGH (ref 4.0–10.5)

## 2018-03-19 LAB — URINALYSIS, ROUTINE W REFLEX MICROSCOPIC
BILIRUBIN URINE: NEGATIVE
GLUCOSE, UA: NEGATIVE mg/dL
Hgb urine dipstick: NEGATIVE
Ketones, ur: NEGATIVE mg/dL
Nitrite: NEGATIVE
PROTEIN: NEGATIVE mg/dL
Specific Gravity, Urine: 1.003 — ABNORMAL LOW (ref 1.005–1.030)
pH: 7 (ref 5.0–8.0)

## 2018-03-19 LAB — BASIC METABOLIC PANEL
Anion gap: 9 (ref 5–15)
BUN: 23 mg/dL (ref 8–23)
CO2: 31 mmol/L (ref 22–32)
Calcium: 9.6 mg/dL (ref 8.9–10.3)
Chloride: 101 mmol/L (ref 98–111)
Creatinine, Ser: 1.14 mg/dL — ABNORMAL HIGH (ref 0.44–1.00)
GFR calc non Af Amer: 40 mL/min — ABNORMAL LOW (ref >60)
GFR, EST AFRICAN AMERICAN: 47 mL/min — AB (ref >60)
Glucose, Bld: 87 mg/dL (ref 70–99)
Potassium: 3 mmol/L — ABNORMAL LOW (ref 3.5–5.1)
SODIUM: 141 mmol/L (ref 135–145)

## 2018-03-19 LAB — TROPONIN I: Troponin I: <0.03 ng/mL (ref <0.03)

## 2018-03-19 LAB — CBG MONITORING, ED: GLUCOSE-CAPILLARY: 85 mg/dL (ref 70–99)

## 2018-03-19 NOTE — ED Provider Notes (Signed)
[facility] EMERGENCY DEPARTMENT Provider Note   CSN: [id number] Arrival date & time: 03/19/18  1327     History   Chief Complaint Chief Complaint  Patient presents with  . Loss of Consciousness    HPI [name] Love is a 84 y.o. female.  HPI Patient presents after syncopal episode.  Had one today.  States she had one yesterday.  States she had them frequently.  States she has a history of orthostatic hypotension.  States she is had good oral intake.  States she was walking today and is felt lightheaded and had to sit down.  No chest pain.  No trouble breathing.  States she is been worked up for this previously. Past Medical History:  Diagnosis Date  . Atrial fibrillation (HCC)   . Carotid artery occlusion   . Constipation 02/04/2008  . Diverticulosis of colon   . Dizziness   . Esophageal reflux   . Hyperlipidemia   . Hypertension   . Thyroid disease   . Urticaria   . Varicose veins     Patient Active Problem List   Diagnosis Date Noted  . Carotid stenosis 03/09/2014  . Aftercare following surgery of the circulatory system 03/09/2014  . Aftercare following surgery of the circulatory system, NEC 03/08/2013  . Occlusion and stenosis of carotid artery without mention of cerebral infarction 02/04/2012    Past Surgical History:  Procedure Laterality Date  . APPENDECTOMY    . CAROTID ENDARTERECTOMY Left 1990     OB History   None      Home Medications    Prior to Admission medications   Medication Sig Start Date End Date Taking? Authorizing Provider  acetaminophen (TYLENOL) 500 MG tablet Take 500 mg by mouth every 6 (six) hours as needed for mild pain, moderate pain, fever or headache.    [provider]  aspirin 81 MG tablet Take 81 mg by mouth daily.    [provider]  Calcium Carbonate-Vitamin D (OSCAL 500/200 D-3 PO) Take 1 tablet by mouth daily.    [provider]  cephALEXin (KEFLEX) 500 MG capsule Take 500  mg by mouth 2 (two) times daily.    [provider]  fludrocortisone (FLORINEF) 0.1 MG tablet Take 0.1 mg by mouth daily. *Usually takes between 1000 and 1030* 03/13/17   [provider]  levothyroxine (SYNTHROID, LEVOTHROID) 125 MCG tablet Take 125 mcg by mouth daily. 12/15/16   [provider]  meloxicam (MOBIC) 7.5 MG tablet Take 7.5 mg by mouth daily. 03/20/17   [provider]  Multiple Vitamin (MULTIVITAMIN WITH MINERALS) TABS tablet Take 1 tablet by mouth daily.    [provider]  Saccharomyces boulardii (FLORASTOR PO) Take 1 tablet by mouth daily.    [provider]  simvastatin (ZOCOR) 20 MG tablet Take 20 mg by mouth daily.    [provider]    Family History Family History  Problem Relation Age of Onset  . Heart attack Father   . Heart disease Father        After age 28  . Heart attack Brother   . Heart attack Brother   . Breast cancer Mother   . Hypertension Mother   . Varicose Veins Mother   . Cancer Sister        ? Breast    Social History Social History   Tobacco Use  . Smoking status: Never Smoker  . Smokeless tobacco: Never Used  Substance Use Topics  .  Alcohol use: Yes    Alcohol/week: 0.0 standard drinks    Comment: a glass of wine every so often per pt  . Drug use: No     Allergies   Patient has no known allergies.   Review of Systems Review of Systems  Constitutional: Negative for appetite change and diaphoresis.  HENT: Negative for congestion.   Respiratory: Negative for shortness of breath.   Cardiovascular: Negative for chest pain.  Gastrointestinal: Negative for abdominal pain.  Genitourinary: Negative for flank pain.  Musculoskeletal: Negative for back pain.  Skin: Negative for rash.  Neurological: Positive for light-headedness.  Hematological: Negative for adenopathy.  Psychiatric/Behavioral: Negative for confusion.     Physical Exam Updated Vital Signs BP (!) 177/91  (BP Location: Right Arm)   Pulse 79   Temp 98.1 F (36.7 C) (Oral)   Resp (!) 24   SpO2 100%   Physical Exam  Constitutional: She appears well-developed.  HENT:  Head: Atraumatic.  Eyes: EOM are normal.  Neck: Neck supple.  Cardiovascular: Normal rate.  Pulmonary/Chest: She has no wheezes. She has no rales.  Abdominal: There is no tenderness.  Musculoskeletal: Normal range of motion.  Neurological: She is alert.  Skin: Skin is warm. Capillary refill takes less than 2 seconds.  Psychiatric: She has a normal mood and affect.     ED Treatments / Results  Labs (all labs ordered are listed, but only abnormal results are displayed) Labs Reviewed  BASIC METABOLIC PANEL - Abnormal; Notable for the following components:      Result Value   Potassium 3.0 (*)    Creatinine, Ser 1.14 (*)    GFR calc non Af Amer 40 (*)    GFR calc Af Amer 47 (*)    All other components within normal limits  CBC - Abnormal; Notable for the following components:   WBC 12.9 (*)    Hemoglobin 11.8 (*)    All other components within normal limits  URINALYSIS, ROUTINE W REFLEX MICROSCOPIC - Abnormal; Notable for the following components:   Color, Urine STRAW (*)    Specific Gravity, Urine 1.003 (*)    Leukocytes, UA TRACE (*)    Bacteria, UA RARE (*)    All other components within normal limits  TROPONIN I  CBG MONITORING, ED    EKG EKG Interpretation  Date/Time:  Friday March 19 2018 13:34:04 EDT Ventricular Rate:  78 PR Interval:    QRS Duration: 93 QT Interval:  430 QTC Calculation: 490 R Axis:   57 Text Interpretation:  Sinus rhythm Biatrial enlargement Nonspecific ST abnormality Baseline wander in lead(s) III Confirmed by Benjiman Core [phone]) on 03/19/2018 1:46:32 PM Also confirmed by Benjiman Core [phone]), editor Elita Quick ([id number])  on 03/19/2018 1:57:07 PM   Radiology No results found.  Procedures Procedures (including critical care time)  Medications Ordered  in ED Medications - No data to display   Initial Impression / Assessment and Plan / ED Course  I have reviewed the triage vital signs and the nursing notes.  Pertinent labs & imaging results that were available during my care of the patient were reviewed by me and considered in my medical decision making (see chart for details).     Patient with syncope.  History of same.  Mild hypokalemia.  On Florinef to help with her pressure.  Blood pressure doing better now.  Discussed with family and they would rather her not have to come into the hospital for these episodes.  Final Clinical  Impressions(s) / ED Diagnoses   Final diagnoses:  Syncope, unspecified syncope type    ED Discharge Orders    None       Benjiman Core, MD 03/19/18 1520

## 2018-03-19 NOTE — ED Notes (Signed)
D/c reviewed with patient and family 

## 2018-03-19 NOTE — ED Notes (Signed)
While obtaining pt's orthostatic VS, when asked to stand pt normally used a cane. Pt is one assist. She was very unsteady while standing. Could not obtain the three minute VS.

## 2018-03-19 NOTE — Discharge Instructions (Signed)
Family would like patient to not be transferred to the ER for the syncopal episodes if possible.  They would rather be consulted first.

## 2018-03-19 NOTE — ED Triage Notes (Signed)
Per EMS- pt from [facility], for c.o. Syncopal episodes. Pt had one yesterday. Then today she was walking and felt bad, so she sat down on the couch and staff witnessed a syncopal episode. No seizure like activity. Pt is at baseline, alert and oriented. Only complaints at present are chronic.

## 2018-05-25 DIAGNOSIS — M10071 Idiopathic gout, right ankle and foot: Secondary | ICD-10-CM | POA: Diagnosis not present

## 2018-06-16 DIAGNOSIS — I951 Orthostatic hypotension: Secondary | ICD-10-CM | POA: Diagnosis not present

## 2018-06-16 DIAGNOSIS — I129 Hypertensive chronic kidney disease with stage 1 through stage 4 chronic kidney disease, or unspecified chronic kidney disease: Secondary | ICD-10-CM | POA: Diagnosis not present

## 2018-06-16 DIAGNOSIS — N183 Chronic kidney disease, stage 3 (moderate): Secondary | ICD-10-CM | POA: Diagnosis not present

## 2018-07-13 DIAGNOSIS — H6121 Impacted cerumen, right ear: Secondary | ICD-10-CM | POA: Diagnosis not present

## 2018-07-29 ENCOUNTER — Other Ambulatory Visit: Payer: Self-pay | Admitting: Geriatric Medicine

## 2018-07-29 ENCOUNTER — Ambulatory Visit
Admission: RE | Admit: 2018-07-29 | Discharge: 2018-07-29 | Disposition: A | Discharge status: Home / Self Care | Payer: Medicare Other | Source: Ambulatory Visit | Attending: Geriatric Medicine | Admitting: Geriatric Medicine

## 2018-07-29 DIAGNOSIS — M79644 Pain in right finger(s): Secondary | ICD-10-CM | POA: Diagnosis not present

## 2018-07-29 DIAGNOSIS — F5101 Primary insomnia: Secondary | ICD-10-CM | POA: Diagnosis not present

## 2018-07-29 DIAGNOSIS — M19041 Primary osteoarthritis, right hand: Secondary | ICD-10-CM | POA: Diagnosis not present

## 2018-07-29 DIAGNOSIS — I129 Hypertensive chronic kidney disease with stage 1 through stage 4 chronic kidney disease, or unspecified chronic kidney disease: Secondary | ICD-10-CM | POA: Diagnosis not present

## 2018-07-29 DIAGNOSIS — N183 Chronic kidney disease, stage 3 (moderate): Secondary | ICD-10-CM | POA: Diagnosis not present

## 2018-08-13 DIAGNOSIS — H903 Sensorineural hearing loss, bilateral: Secondary | ICD-10-CM | POA: Diagnosis not present

## 2018-08-26 DIAGNOSIS — H903 Sensorineural hearing loss, bilateral: Secondary | ICD-10-CM | POA: Diagnosis not present

## 2018-08-27 DIAGNOSIS — R29898 Other symptoms and signs involving the musculoskeletal system: Secondary | ICD-10-CM | POA: Diagnosis not present

## 2018-08-27 DIAGNOSIS — Z8679 Personal history of other diseases of the circulatory system: Secondary | ICD-10-CM | POA: Diagnosis not present

## 2018-08-27 DIAGNOSIS — I129 Hypertensive chronic kidney disease with stage 1 through stage 4 chronic kidney disease, or unspecified chronic kidney disease: Secondary | ICD-10-CM | POA: Diagnosis not present

## 2018-08-27 DIAGNOSIS — N183 Chronic kidney disease, stage 3 (moderate): Secondary | ICD-10-CM | POA: Diagnosis not present

## 2018-10-14 DIAGNOSIS — N183 Chronic kidney disease, stage 3 (moderate): Secondary | ICD-10-CM | POA: Diagnosis not present

## 2018-10-14 DIAGNOSIS — Z0000 Encounter for general adult medical examination without abnormal findings: Secondary | ICD-10-CM | POA: Diagnosis not present

## 2018-10-14 DIAGNOSIS — E039 Hypothyroidism, unspecified: Secondary | ICD-10-CM | POA: Diagnosis not present

## 2018-10-14 DIAGNOSIS — I129 Hypertensive chronic kidney disease with stage 1 through stage 4 chronic kidney disease, or unspecified chronic kidney disease: Secondary | ICD-10-CM | POA: Diagnosis not present

## 2018-10-15 DIAGNOSIS — D649 Anemia, unspecified: Secondary | ICD-10-CM | POA: Diagnosis not present

## 2018-10-15 DIAGNOSIS — Z79899 Other long term (current) drug therapy: Secondary | ICD-10-CM | POA: Diagnosis not present

## 2018-12-21 ENCOUNTER — Telehealth: Payer: Self-pay | Admitting: Internal Medicine

## 2018-12-21 NOTE — Telephone Encounter (Signed)
Called patient's daughter Nunzio Cory, to schedule Palliative Consult, no answer.  Message left with reason for call along with my name and contact number.

## 2018-12-21 NOTE — Telephone Encounter (Signed)
Spoke with patient and attempted to schedule Palliative consult.  While explaining to her why I was calling and what Palliative was she said "No Thank You" and hung the phone up.  Will follow-up with daughter tomorrow - waiting on call back from her.

## 2018-12-22 ENCOUNTER — Telehealth: Payer: Self-pay | Admitting: Internal Medicine

## 2018-12-22 NOTE — Telephone Encounter (Signed)
Spoke with daughter and explained to her about her Mom hanging up when I called her yesterday to schedule the Consult.  I asked daughter if she would contact patient and let her know why I was calling and she said she would.  I also let daughter know that Gonzella Lex, NP has talked with the RN at the facility and she was in agreement with her making an in-person visit. I received verbal consent from daughter and she was in agreement with above.

## 2018-12-24 ENCOUNTER — Telehealth: Payer: Self-pay | Admitting: Internal Medicine

## 2018-12-24 NOTE — Telephone Encounter (Signed)
Contacted [name], RN at [facility] to see if she was able to speak with [name] Love and explain to her why I was calling her a couple of days ago and she said yes she had spoke with [name] Love and explained Palliative services and she was in agreement with this.  [name] Love and I scheduled the In-Person Palliative consult for 12/28/18 @ 11 AM.  She wanted me to let [name] Lex, NP know that she needs to call her when she gets to the facility and she will walk her to [name] Love's room and introduce them. COVID screening via [name] Love was negative.

## 2018-12-28 ENCOUNTER — Other Ambulatory Visit: Payer: Medicare Other | Admitting: Internal Medicine

## 2018-12-28 ENCOUNTER — Other Ambulatory Visit: Payer: Self-pay

## 2018-12-28 DIAGNOSIS — Z515 Encounter for palliative care: Secondary | ICD-10-CM

## 2019-01-18 ENCOUNTER — Other Ambulatory Visit: Payer: Medicare Other | Admitting: Internal Medicine

## 2019-01-18 ENCOUNTER — Other Ambulatory Visit: Payer: Self-pay

## 2019-01-18 NOTE — Progress Notes (Incomplete)
    [facility] Consult Note Telephone: [phone]  Fax: [phone]  PATIENT NAME: [name] Love DOB: 1922-06-14 MRN: [id number]  PRIMARY CARE PROVIDER:   Lajean Manes, MD  REFERRING PROVIDER:  Lajean Manes, MD [address] [place],  [place] [address]  RESPONSIBLE PARTY:   Self and Love,[name]  ([phone]    RECOMMENDATIONS and PLAN:  1.  Lightheaded: Intermittent and chronic.  Corrects with rest and hydration.  Continue limited activities and   2.  Depression:  Related to isolation and separation from family.   Asked family to visit in person on next scheduled visitation day.  3.  ACP:  Most completed.  Compressions only. No intubation.  Limited interventions without return to hospital. Determine use of antibiotics, No IV therapy or tube feedings  I spent *** minutes providing this consultation,  from *** to ***. More than 50% of the time in this consultation was spent coordinating communication.   HISTORY OF PRESENT ILLNESS:  [name] Love is a 85 y.o. year old female with multiple medical problems including ***. Palliative Care was asked to help address goals of care.   CODE STATUS:   PPS: 0% HOSPICE ELIGIBILITY/DIAGNOSIS: TBD  PAST MEDICAL HISTORY:  Past Medical History:  Diagnosis Date  . Atrial fibrillation ([place])   . Carotid artery occlusion   . Constipation 02/04/2008  . Diverticulosis of colon   . Dizziness   . Esophageal reflux   . Hyperlipidemia   . Hypertension   . Thyroid disease   . Urticaria   . Varicose veins     SOCIAL HX:  Social History   Tobacco Use  . Smoking status: Never Smoker  . Smokeless tobacco: Never Used  Substance Use Topics  . Alcohol use: Yes    Alcohol/week: 0.0 standard drinks    Comment: a glass of wine every so often per pt    ALLERGIES: No Known Allergies   PERTINENT MEDICATIONS:  Outpatient Encounter Medications as of 01/18/2019   Medication Sig  . acetaminophen (TYLENOL) 500 MG tablet Take 500 mg by mouth every 6 (six) hours as needed for mild pain, moderate pain, fever or headache.  Marland Kitchen aspirin 81 MG tablet Take 81 mg by mouth daily.  . Calcium Carbonate-Vitamin D (OSCAL 500/200 D-3 PO) Take 1 tablet by mouth daily.  . cephALEXin (KEFLEX) 500 MG capsule Take 500 mg by mouth 2 (two) times daily.  . fludrocortisone (FLORINEF) 0.1 MG tablet Take 0.1 mg by mouth daily. *Usually takes between 1000 and 1030*  . levothyroxine (SYNTHROID, LEVOTHROID) 125 MCG tablet Take 125 mcg by mouth daily.  . meloxicam (MOBIC) 7.5 MG tablet Take 7.5 mg by mouth daily.  . Multiple Vitamin (MULTIVITAMIN WITH MINERALS) TABS tablet Take 1 tablet by mouth daily.  . Saccharomyces boulardii (FLORASTOR PO) Take 1 tablet by mouth daily.  . simvastatin (ZOCOR) 20 MG tablet Take 20 mg by mouth daily.   No facility-administered encounter medications on file as of 01/18/2019.     PHYSICAL EXAM:   General: NAD, frail appearing, thin Cardiovascular: regular rate and rhythm Pulmonary: clear ant fields Abdomen: soft, nontender, + bowel sounds GU: no suprapubic tenderness Extremities: no edema, no joint deformities Skin: no rashes Neurological: Weakness but otherwise nonfocal  Gonzella Lex, NP

## 2019-01-18 NOTE — Progress Notes (Incomplete)
    [facility] Consult Note Telephone: [phone]  Fax: [phone]  PATIENT NAME: [name] Love DOB: [birth year] MRN: [id number]  PRIMARY CARE PROVIDER:   Lajean Manes, MD  REFERRING PROVIDER:  Lajean Manes, MD [address] [place],  [place] [address]  RESPONSIBLE PARTY:   Self and daughter  [name] Love Daughter [phone]  [phone]      RECOMMENDATIONS and PLAN:   Palliative Care Encounter Z51.5  1.  Advance care planning:  Reviewed MOST and code status.  Documents left with pt and will re-address on f/u visit.  She would like to remain a full code at this time. Continue residence in [facility] facility with minimal nursing assistance.  2.  Depression:  Related to separation from family and isolation during COVID-crisis.  Assisted with contacting daughter and family via facetime. Instructions for repeat events written and left with patient.  Scheduled family video visits.      I spent *** minutes providing this consultation,  from *** to ***. More than 50% of the time in this consultation was spent coordinating communication.   HISTORY OF PRESENT ILLNESS:  [name] Love is a 85 y.o. year old female with multiple medical problems including ***. Palliative Care was asked to help address goals of care.   CODE STATUS:   PPS: 50% HOSPICE ELIGIBILITY/DIAGNOSIS: TBD  PAST MEDICAL HISTORY:  Past Medical History:  Diagnosis Date  . Atrial fibrillation ([place])   . Carotid artery occlusion   . Constipation 02/04/2008  . Diverticulosis of colon   . Dizziness   . Esophageal reflux   . Hyperlipidemia   . Hypertension   . Thyroid disease   . Urticaria   . Varicose veins      ALLERGIES: No Known Allergies   PERTINENT MEDICATIONS:  Outpatient Encounter Medications as of 12/28/2018  Medication Sig  . acetaminophen (TYLENOL) 500 MG tablet Take 500 mg by mouth every 6 (six) hours as needed for mild pain, moderate  pain, fever or headache.  Marland Kitchen aspirin 81 MG tablet Take 81 mg by mouth daily.  . Calcium Carbonate-Vitamin D (OSCAL 500/200 D-3 PO) Take 1 tablet by mouth daily.  . cephALEXin (KEFLEX) 500 MG capsule Take 500 mg by mouth 2 (two) times daily.  . fludrocortisone (FLORINEF) 0.1 MG tablet Take 0.1 mg by mouth daily. *Usually takes between 1000 and 1030*  . levothyroxine (SYNTHROID, LEVOTHROID) 125 MCG tablet Take 125 mcg by mouth daily.  . meloxicam (MOBIC) 7.5 MG tablet Take 7.5 mg by mouth daily.  . Multiple Vitamin (MULTIVITAMIN WITH MINERALS) TABS tablet Take 1 tablet by mouth daily.  . Saccharomyces boulardii (FLORASTOR PO) Take 1 tablet by mouth daily.  . simvastatin (ZOCOR) 20 MG tablet Take 20 mg by mouth daily.   No facility-administered encounter medications on file as of 12/28/2018.     PHYSICAL EXAM:   General: NAD, frail appearing, thin Cardiovascular: regular rate and rhythm Pulmonary: clear ant fields Abdomen: soft, nontender, + bowel sounds GU: no suprapubic tenderness Extremities: no edema, no joint deformities Skin: no rashes Neurological: Weakness but otherwise nonfocal  Gonzella Lex, NP

## 2019-02-10 DIAGNOSIS — R2689 Other abnormalities of gait and mobility: Secondary | ICD-10-CM | POA: Diagnosis not present

## 2019-02-10 DIAGNOSIS — R55 Syncope and collapse: Secondary | ICD-10-CM | POA: Diagnosis not present

## 2019-02-10 DIAGNOSIS — M6281 Muscle weakness (generalized): Secondary | ICD-10-CM | POA: Diagnosis not present

## 2019-02-15 ENCOUNTER — Other Ambulatory Visit: Payer: Medicare Other | Admitting: Internal Medicine

## 2019-02-15 ENCOUNTER — Other Ambulatory Visit: Payer: Self-pay

## 2019-02-15 DIAGNOSIS — R2689 Other abnormalities of gait and mobility: Secondary | ICD-10-CM | POA: Diagnosis not present

## 2019-02-15 DIAGNOSIS — R55 Syncope and collapse: Secondary | ICD-10-CM | POA: Diagnosis not present

## 2019-02-15 DIAGNOSIS — M6281 Muscle weakness (generalized): Secondary | ICD-10-CM | POA: Diagnosis not present

## 2019-02-16 DIAGNOSIS — R55 Syncope and collapse: Secondary | ICD-10-CM | POA: Diagnosis not present

## 2019-02-16 DIAGNOSIS — M6281 Muscle weakness (generalized): Secondary | ICD-10-CM | POA: Diagnosis not present

## 2019-02-16 DIAGNOSIS — R2689 Other abnormalities of gait and mobility: Secondary | ICD-10-CM | POA: Diagnosis not present

## 2019-02-17 DIAGNOSIS — R55 Syncope and collapse: Secondary | ICD-10-CM | POA: Diagnosis not present

## 2019-02-17 DIAGNOSIS — R2689 Other abnormalities of gait and mobility: Secondary | ICD-10-CM | POA: Diagnosis not present

## 2019-02-17 DIAGNOSIS — M6281 Muscle weakness (generalized): Secondary | ICD-10-CM | POA: Diagnosis not present

## 2019-02-21 DIAGNOSIS — M6281 Muscle weakness (generalized): Secondary | ICD-10-CM | POA: Diagnosis not present

## 2019-02-21 DIAGNOSIS — R2689 Other abnormalities of gait and mobility: Secondary | ICD-10-CM | POA: Diagnosis not present

## 2019-02-21 DIAGNOSIS — R55 Syncope and collapse: Secondary | ICD-10-CM | POA: Diagnosis not present

## 2019-02-22 DIAGNOSIS — R55 Syncope and collapse: Secondary | ICD-10-CM | POA: Diagnosis not present

## 2019-02-22 DIAGNOSIS — M6281 Muscle weakness (generalized): Secondary | ICD-10-CM | POA: Diagnosis not present

## 2019-02-22 DIAGNOSIS — R2689 Other abnormalities of gait and mobility: Secondary | ICD-10-CM | POA: Diagnosis not present

## 2019-02-23 DIAGNOSIS — R55 Syncope and collapse: Secondary | ICD-10-CM | POA: Diagnosis not present

## 2019-02-23 DIAGNOSIS — R2689 Other abnormalities of gait and mobility: Secondary | ICD-10-CM | POA: Diagnosis not present

## 2019-02-23 DIAGNOSIS — M6281 Muscle weakness (generalized): Secondary | ICD-10-CM | POA: Diagnosis not present

## 2019-02-24 DIAGNOSIS — R2689 Other abnormalities of gait and mobility: Secondary | ICD-10-CM | POA: Diagnosis not present

## 2019-02-24 DIAGNOSIS — M6281 Muscle weakness (generalized): Secondary | ICD-10-CM | POA: Diagnosis not present

## 2019-02-24 DIAGNOSIS — R55 Syncope and collapse: Secondary | ICD-10-CM | POA: Diagnosis not present

## 2019-02-28 DIAGNOSIS — R2689 Other abnormalities of gait and mobility: Secondary | ICD-10-CM | POA: Diagnosis not present

## 2019-02-28 DIAGNOSIS — R55 Syncope and collapse: Secondary | ICD-10-CM | POA: Diagnosis not present

## 2019-02-28 DIAGNOSIS — M6281 Muscle weakness (generalized): Secondary | ICD-10-CM | POA: Diagnosis not present

## 2019-03-01 DIAGNOSIS — R55 Syncope and collapse: Secondary | ICD-10-CM | POA: Diagnosis not present

## 2019-03-01 DIAGNOSIS — M6281 Muscle weakness (generalized): Secondary | ICD-10-CM | POA: Diagnosis not present

## 2019-03-01 DIAGNOSIS — R2689 Other abnormalities of gait and mobility: Secondary | ICD-10-CM | POA: Diagnosis not present

## 2019-03-02 DIAGNOSIS — R2689 Other abnormalities of gait and mobility: Secondary | ICD-10-CM | POA: Diagnosis not present

## 2019-03-02 DIAGNOSIS — M6281 Muscle weakness (generalized): Secondary | ICD-10-CM | POA: Diagnosis not present

## 2019-03-02 DIAGNOSIS — R55 Syncope and collapse: Secondary | ICD-10-CM | POA: Diagnosis not present

## 2019-03-03 DIAGNOSIS — R55 Syncope and collapse: Secondary | ICD-10-CM | POA: Diagnosis not present

## 2019-03-03 DIAGNOSIS — R2689 Other abnormalities of gait and mobility: Secondary | ICD-10-CM | POA: Diagnosis not present

## 2019-03-03 DIAGNOSIS — M6281 Muscle weakness (generalized): Secondary | ICD-10-CM | POA: Diagnosis not present

## 2019-03-08 DIAGNOSIS — R55 Syncope and collapse: Secondary | ICD-10-CM | POA: Diagnosis not present

## 2019-03-08 DIAGNOSIS — M6281 Muscle weakness (generalized): Secondary | ICD-10-CM | POA: Diagnosis not present

## 2019-03-08 DIAGNOSIS — R2689 Other abnormalities of gait and mobility: Secondary | ICD-10-CM | POA: Diagnosis not present

## 2019-03-10 DIAGNOSIS — I129 Hypertensive chronic kidney disease with stage 1 through stage 4 chronic kidney disease, or unspecified chronic kidney disease: Secondary | ICD-10-CM | POA: Diagnosis not present

## 2019-03-10 DIAGNOSIS — R413 Other amnesia: Secondary | ICD-10-CM | POA: Diagnosis not present

## 2019-03-10 DIAGNOSIS — N1831 Chronic kidney disease, stage 3a: Secondary | ICD-10-CM | POA: Diagnosis not present

## 2019-03-10 DIAGNOSIS — I951 Orthostatic hypotension: Secondary | ICD-10-CM | POA: Diagnosis not present

## 2019-03-16 DIAGNOSIS — I129 Hypertensive chronic kidney disease with stage 1 through stage 4 chronic kidney disease, or unspecified chronic kidney disease: Secondary | ICD-10-CM | POA: Diagnosis not present

## 2019-03-16 DIAGNOSIS — R1031 Right lower quadrant pain: Secondary | ICD-10-CM | POA: Diagnosis not present

## 2019-03-16 DIAGNOSIS — N1831 Chronic kidney disease, stage 3a: Secondary | ICD-10-CM | POA: Diagnosis not present

## 2019-03-17 DIAGNOSIS — E876 Hypokalemia: Secondary | ICD-10-CM | POA: Diagnosis not present

## 2019-03-17 DIAGNOSIS — E559 Vitamin D deficiency, unspecified: Secondary | ICD-10-CM | POA: Diagnosis not present

## 2019-03-17 DIAGNOSIS — E039 Hypothyroidism, unspecified: Secondary | ICD-10-CM | POA: Diagnosis not present

## 2019-03-17 DIAGNOSIS — M6281 Muscle weakness (generalized): Secondary | ICD-10-CM | POA: Diagnosis not present

## 2019-03-18 DIAGNOSIS — G4700 Insomnia, unspecified: Secondary | ICD-10-CM | POA: Diagnosis not present

## 2019-03-18 DIAGNOSIS — N183 Chronic kidney disease, stage 3 unspecified: Secondary | ICD-10-CM | POA: Diagnosis not present

## 2019-03-18 DIAGNOSIS — E039 Hypothyroidism, unspecified: Secondary | ICD-10-CM | POA: Diagnosis not present

## 2019-03-18 DIAGNOSIS — E876 Hypokalemia: Secondary | ICD-10-CM | POA: Diagnosis not present

## 2019-03-18 DIAGNOSIS — R1031 Right lower quadrant pain: Secondary | ICD-10-CM | POA: Diagnosis not present

## 2019-03-18 DIAGNOSIS — R2689 Other abnormalities of gait and mobility: Secondary | ICD-10-CM | POA: Diagnosis not present

## 2019-03-18 DIAGNOSIS — I959 Hypotension, unspecified: Secondary | ICD-10-CM | POA: Diagnosis not present

## 2019-03-18 DIAGNOSIS — E569 Vitamin deficiency, unspecified: Secondary | ICD-10-CM | POA: Diagnosis not present

## 2019-03-18 DIAGNOSIS — M6281 Muscle weakness (generalized): Secondary | ICD-10-CM | POA: Diagnosis not present

## 2019-03-18 DIAGNOSIS — I129 Hypertensive chronic kidney disease with stage 1 through stage 4 chronic kidney disease, or unspecified chronic kidney disease: Secondary | ICD-10-CM | POA: Diagnosis not present

## 2019-03-18 DIAGNOSIS — R41841 Cognitive communication deficit: Secondary | ICD-10-CM | POA: Diagnosis not present

## 2019-03-18 DIAGNOSIS — Z23 Encounter for immunization: Secondary | ICD-10-CM | POA: Diagnosis not present

## 2019-03-18 DIAGNOSIS — E559 Vitamin D deficiency, unspecified: Secondary | ICD-10-CM | POA: Diagnosis not present

## 2019-03-21 DIAGNOSIS — M6281 Muscle weakness (generalized): Secondary | ICD-10-CM | POA: Diagnosis not present

## 2019-03-21 DIAGNOSIS — E559 Vitamin D deficiency, unspecified: Secondary | ICD-10-CM | POA: Diagnosis not present

## 2019-03-21 DIAGNOSIS — E039 Hypothyroidism, unspecified: Secondary | ICD-10-CM | POA: Diagnosis not present

## 2019-03-21 DIAGNOSIS — E876 Hypokalemia: Secondary | ICD-10-CM | POA: Diagnosis not present

## 2019-03-22 DIAGNOSIS — M6281 Muscle weakness (generalized): Secondary | ICD-10-CM | POA: Diagnosis not present

## 2019-03-22 DIAGNOSIS — I959 Hypotension, unspecified: Secondary | ICD-10-CM | POA: Diagnosis not present

## 2019-03-22 DIAGNOSIS — E876 Hypokalemia: Secondary | ICD-10-CM | POA: Diagnosis not present

## 2019-03-22 DIAGNOSIS — E039 Hypothyroidism, unspecified: Secondary | ICD-10-CM | POA: Diagnosis not present

## 2019-03-23 DIAGNOSIS — Z03818 Encounter for observation for suspected exposure to other biological agents ruled out: Secondary | ICD-10-CM | POA: Diagnosis not present

## 2019-03-24 DIAGNOSIS — M19071 Primary osteoarthritis, right ankle and foot: Secondary | ICD-10-CM | POA: Diagnosis not present

## 2019-03-24 DIAGNOSIS — M1611 Unilateral primary osteoarthritis, right hip: Secondary | ICD-10-CM | POA: Diagnosis not present

## 2019-03-25 DIAGNOSIS — E559 Vitamin D deficiency, unspecified: Secondary | ICD-10-CM | POA: Diagnosis not present

## 2019-03-25 DIAGNOSIS — E039 Hypothyroidism, unspecified: Secondary | ICD-10-CM | POA: Diagnosis not present

## 2019-03-25 DIAGNOSIS — E876 Hypokalemia: Secondary | ICD-10-CM | POA: Diagnosis not present

## 2019-03-25 DIAGNOSIS — M6281 Muscle weakness (generalized): Secondary | ICD-10-CM | POA: Diagnosis not present

## 2019-03-31 ENCOUNTER — Inpatient Hospital Stay (HOSPITAL_COMMUNITY)
Admission: EM | Admit: 2019-03-31 | Discharge: 2019-04-04 | DRG: 535 | Disposition: A | Discharge status: Skilled Nursing Facility | Payer: Medicare Other | Attending: Internal Medicine | Admitting: Internal Medicine

## 2019-03-31 ENCOUNTER — Encounter (HOSPITAL_COMMUNITY): Payer: Self-pay | Admitting: Emergency Medicine

## 2019-03-31 ENCOUNTER — Other Ambulatory Visit: Payer: Self-pay

## 2019-03-31 ENCOUNTER — Emergency Department (HOSPITAL_COMMUNITY): Payer: Medicare Other

## 2019-03-31 DIAGNOSIS — D72829 Elevated white blood cell count, unspecified: Secondary | ICD-10-CM | POA: Diagnosis not present

## 2019-03-31 DIAGNOSIS — R2689 Other abnormalities of gait and mobility: Secondary | ICD-10-CM | POA: Diagnosis not present

## 2019-03-31 DIAGNOSIS — R778 Other specified abnormalities of plasma proteins: Secondary | ICD-10-CM | POA: Diagnosis not present

## 2019-03-31 DIAGNOSIS — S32502D Unspecified fracture of left pubis, subsequent encounter for fracture with routine healing: Secondary | ICD-10-CM | POA: Diagnosis not present

## 2019-03-31 DIAGNOSIS — M549 Dorsalgia, unspecified: Secondary | ICD-10-CM

## 2019-03-31 DIAGNOSIS — S3289XA Fracture of other parts of pelvis, initial encounter for closed fracture: Secondary | ICD-10-CM | POA: Diagnosis not present

## 2019-03-31 DIAGNOSIS — I4891 Unspecified atrial fibrillation: Secondary | ICD-10-CM | POA: Diagnosis not present

## 2019-03-31 DIAGNOSIS — R7989 Other specified abnormal findings of blood chemistry: Secondary | ICD-10-CM | POA: Diagnosis present

## 2019-03-31 DIAGNOSIS — S32592A Other specified fracture of left pubis, initial encounter for closed fracture: Principal | ICD-10-CM | POA: Diagnosis present

## 2019-03-31 DIAGNOSIS — S329XXA Fracture of unspecified parts of lumbosacral spine and pelvis, initial encounter for closed fracture: Secondary | ICD-10-CM

## 2019-03-31 DIAGNOSIS — N183 Chronic kidney disease, stage 3 unspecified: Secondary | ICD-10-CM | POA: Diagnosis present

## 2019-03-31 DIAGNOSIS — W010XXA Fall on same level from slipping, tripping and stumbling without subsequent striking against object, initial encounter: Secondary | ICD-10-CM | POA: Diagnosis present

## 2019-03-31 DIAGNOSIS — Z79890 Hormone replacement therapy (postmenopausal): Secondary | ICD-10-CM | POA: Diagnosis not present

## 2019-03-31 DIAGNOSIS — Z20828 Contact with and (suspected) exposure to other viral communicable diseases: Secondary | ICD-10-CM | POA: Diagnosis not present

## 2019-03-31 DIAGNOSIS — Z803 Family history of malignant neoplasm of breast: Secondary | ICD-10-CM | POA: Diagnosis not present

## 2019-03-31 DIAGNOSIS — E039 Hypothyroidism, unspecified: Secondary | ICD-10-CM | POA: Diagnosis not present

## 2019-03-31 DIAGNOSIS — Z66 Do not resuscitate: Secondary | ICD-10-CM | POA: Diagnosis present

## 2019-03-31 DIAGNOSIS — Z7982 Long term (current) use of aspirin: Secondary | ICD-10-CM

## 2019-03-31 DIAGNOSIS — S3992XA Unspecified injury of lower back, initial encounter: Secondary | ICD-10-CM | POA: Diagnosis not present

## 2019-03-31 DIAGNOSIS — E785 Hyperlipidemia, unspecified: Secondary | ICD-10-CM | POA: Diagnosis present

## 2019-03-31 DIAGNOSIS — K5900 Constipation, unspecified: Secondary | ICD-10-CM | POA: Diagnosis not present

## 2019-03-31 DIAGNOSIS — M545 Low back pain: Secondary | ICD-10-CM | POA: Diagnosis not present

## 2019-03-31 DIAGNOSIS — N1831 Chronic kidney disease, stage 3a: Secondary | ICD-10-CM | POA: Diagnosis not present

## 2019-03-31 DIAGNOSIS — S199XXA Unspecified injury of neck, initial encounter: Secondary | ICD-10-CM | POA: Diagnosis not present

## 2019-03-31 DIAGNOSIS — Z79899 Other long term (current) drug therapy: Secondary | ICD-10-CM

## 2019-03-31 DIAGNOSIS — S299XXA Unspecified injury of thorax, initial encounter: Secondary | ICD-10-CM | POA: Diagnosis not present

## 2019-03-31 DIAGNOSIS — F419 Anxiety disorder, unspecified: Secondary | ICD-10-CM | POA: Diagnosis not present

## 2019-03-31 DIAGNOSIS — E559 Vitamin D deficiency, unspecified: Secondary | ICD-10-CM | POA: Diagnosis not present

## 2019-03-31 DIAGNOSIS — S8992XA Unspecified injury of left lower leg, initial encounter: Secondary | ICD-10-CM | POA: Diagnosis not present

## 2019-03-31 DIAGNOSIS — Z8249 Family history of ischemic heart disease and other diseases of the circulatory system: Secondary | ICD-10-CM

## 2019-03-31 DIAGNOSIS — S79912A Unspecified injury of left hip, initial encounter: Secondary | ICD-10-CM | POA: Diagnosis not present

## 2019-03-31 DIAGNOSIS — R0902 Hypoxemia: Secondary | ICD-10-CM | POA: Diagnosis not present

## 2019-03-31 DIAGNOSIS — W19XXXA Unspecified fall, initial encounter: Secondary | ICD-10-CM | POA: Diagnosis not present

## 2019-03-31 DIAGNOSIS — I129 Hypertensive chronic kidney disease with stage 1 through stage 4 chronic kidney disease, or unspecified chronic kidney disease: Secondary | ICD-10-CM | POA: Diagnosis present

## 2019-03-31 DIAGNOSIS — J9601 Acute respiratory failure with hypoxia: Secondary | ICD-10-CM | POA: Diagnosis not present

## 2019-03-31 DIAGNOSIS — M6281 Muscle weakness (generalized): Secondary | ICD-10-CM | POA: Diagnosis not present

## 2019-03-31 DIAGNOSIS — Z03818 Encounter for observation for suspected exposure to other biological agents ruled out: Secondary | ICD-10-CM | POA: Diagnosis not present

## 2019-03-31 DIAGNOSIS — I959 Hypotension, unspecified: Secondary | ICD-10-CM | POA: Diagnosis not present

## 2019-03-31 DIAGNOSIS — Z111 Encounter for screening for respiratory tuberculosis: Secondary | ICD-10-CM | POA: Diagnosis not present

## 2019-03-31 DIAGNOSIS — K219 Gastro-esophageal reflux disease without esophagitis: Secondary | ICD-10-CM | POA: Diagnosis present

## 2019-03-31 DIAGNOSIS — M2550 Pain in unspecified joint: Secondary | ICD-10-CM | POA: Diagnosis not present

## 2019-03-31 DIAGNOSIS — M1712 Unilateral primary osteoarthritis, left knee: Secondary | ICD-10-CM | POA: Diagnosis not present

## 2019-03-31 DIAGNOSIS — R52 Pain, unspecified: Secondary | ICD-10-CM

## 2019-03-31 DIAGNOSIS — M25552 Pain in left hip: Secondary | ICD-10-CM | POA: Diagnosis not present

## 2019-03-31 DIAGNOSIS — S32502A Unspecified fracture of left pubis, initial encounter for closed fracture: Secondary | ICD-10-CM | POA: Diagnosis not present

## 2019-03-31 DIAGNOSIS — Z9181 History of falling: Secondary | ICD-10-CM | POA: Diagnosis not present

## 2019-03-31 DIAGNOSIS — S32512A Fracture of superior rim of left pubis, initial encounter for closed fracture: Secondary | ICD-10-CM | POA: Diagnosis not present

## 2019-03-31 DIAGNOSIS — I160 Hypertensive urgency: Secondary | ICD-10-CM | POA: Diagnosis not present

## 2019-03-31 DIAGNOSIS — I10 Essential (primary) hypertension: Secondary | ICD-10-CM | POA: Diagnosis not present

## 2019-03-31 DIAGNOSIS — G4700 Insomnia, unspecified: Secondary | ICD-10-CM | POA: Diagnosis not present

## 2019-03-31 DIAGNOSIS — S0990XA Unspecified injury of head, initial encounter: Secondary | ICD-10-CM | POA: Diagnosis not present

## 2019-03-31 DIAGNOSIS — I951 Orthostatic hypotension: Secondary | ICD-10-CM | POA: Diagnosis not present

## 2019-03-31 DIAGNOSIS — Z7401 Bed confinement status: Secondary | ICD-10-CM | POA: Diagnosis not present

## 2019-03-31 LAB — CBC WITH DIFFERENTIAL/PLATELET
Abs Immature Granulocytes: 0.22 10*3/uL — ABNORMAL HIGH (ref 0.00–0.07)
Basophils Absolute: 0.1 10*3/uL (ref 0.0–0.1)
Basophils Relative: 0 %
Eosinophils Absolute: 0.1 10*3/uL (ref 0.0–0.5)
Eosinophils Relative: 1 %
HCT: 39 % (ref 36.0–46.0)
Hemoglobin: 12.2 g/dL (ref 12.0–15.0)
Immature Granulocytes: 2 %
Lymphocytes Relative: 6 %
Lymphs Abs: 0.8 10*3/uL (ref 0.7–4.0)
MCH: 29.6 pg (ref 26.0–34.0)
MCHC: 31.3 g/dL (ref 30.0–36.0)
MCV: 94.7 fL (ref 80.0–100.0)
Monocytes Absolute: 0.8 10*3/uL (ref 0.1–1.0)
Monocytes Relative: 6 %
Neutro Abs: 10.9 10*3/uL — ABNORMAL HIGH (ref 1.7–7.7)
Neutrophils Relative %: 85 %
Platelets: 424 10*3/uL — ABNORMAL HIGH (ref 150–400)
RBC: 4.12 MIL/uL (ref 3.87–5.11)
RDW: 13.5 % (ref 11.5–15.5)
WBC: 12.9 10*3/uL — ABNORMAL HIGH (ref 4.0–10.5)
nRBC: 0 % (ref 0.0–0.2)

## 2019-03-31 LAB — BASIC METABOLIC PANEL
Anion gap: 14 (ref 5–15)
BUN: 25 mg/dL — ABNORMAL HIGH (ref 8–23)
CO2: 26 mmol/L (ref 22–32)
Calcium: 9.8 mg/dL (ref 8.9–10.3)
Chloride: 100 mmol/L (ref 98–111)
Creatinine, Ser: 1.05 mg/dL — ABNORMAL HIGH (ref 0.44–1.00)
GFR calc Af Amer: 53 mL/min — ABNORMAL LOW (ref >60)
GFR calc non Af Amer: 46 mL/min — ABNORMAL LOW (ref >60)
Glucose, Bld: 111 mg/dL — ABNORMAL HIGH (ref 70–99)
Potassium: 3.8 mmol/L (ref 3.5–5.1)
Sodium: 140 mmol/L (ref 135–145)

## 2019-03-31 LAB — TROPONIN I (HIGH SENSITIVITY)
Troponin I (High Sensitivity): 26 ng/L — ABNORMAL HIGH (ref <18)
Troponin I (High Sensitivity): 25 ng/L — ABNORMAL HIGH (ref <18)

## 2019-03-31 MED ORDER — HYDROCODONE-ACETAMINOPHEN 5-325 MG PO TABS
1.0000 | ORAL_TABLET | ORAL | Status: DC | PRN
  Administered 2019-04-01: 1 via ORAL
  Filled 2019-03-31: qty 1

## 2019-03-31 MED ORDER — MORPHINE SULFATE (PF) 4 MG/ML IV SOLN
4.0000 mg | Freq: Once | INTRAVENOUS | Status: AC
  Administered 2019-03-31: 4 mg via INTRAVENOUS
  Filled 2019-03-31: qty 1

## 2019-03-31 MED ORDER — LEVOTHYROXINE SODIUM 25 MCG PO TABS: 125.0000 ug | ORAL_TABLET | Freq: Every day | ORAL | Status: DC

## 2019-03-31 MED ORDER — SODIUM CHLORIDE (PF) 0.9 % IJ SOLN
INTRAMUSCULAR | Status: AC
  Administered 2019-03-31: 19:00:00
  Filled 2019-03-31: qty 50

## 2019-03-31 MED ORDER — ACETAMINOPHEN 325 MG PO TABS: 650.0000 mg | ORAL_TABLET | Freq: Four times a day (QID) | ORAL | Status: DC | PRN

## 2019-03-31 MED ORDER — ONDANSETRON HCL 4 MG/2ML IJ SOLN: 4.0000 mg | Freq: Four times a day (QID) | INTRAMUSCULAR | Status: DC | PRN

## 2019-03-31 MED ORDER — HYDRALAZINE HCL 20 MG/ML IJ SOLN
10.0000 mg | Freq: Once | INTRAMUSCULAR | Status: AC
  Administered 2019-03-31: 10 mg via INTRAVENOUS
  Filled 2019-03-31: qty 1

## 2019-03-31 MED ORDER — SODIUM CHLORIDE 0.9% FLUSH
3.0000 mL | Freq: Two times a day (BID) | INTRAVENOUS | Status: DC
  Administered 2019-04-02: 3 mL via INTRAVENOUS

## 2019-03-31 MED ORDER — LEVOTHYROXINE SODIUM 112 MCG PO TABS
112.0000 ug | ORAL_TABLET | Freq: Every day | ORAL | Status: DC
  Administered 2019-04-01 – 2019-04-04 (×4): 112 ug via ORAL
  Filled 2019-03-31 (×4): qty 1

## 2019-03-31 MED ORDER — POLYETHYLENE GLYCOL 3350 17 G PO PACK: 17.0000 g | PACK | Freq: Every day | ORAL | Status: DC | PRN

## 2019-03-31 MED ORDER — SODIUM CHLORIDE 0.9% FLUSH: 3.0000 mL | INTRAVENOUS | Status: DC | PRN

## 2019-03-31 MED ORDER — SODIUM CHLORIDE 0.9 % IV SOLN: 250.0000 mL | INTRAVENOUS | Status: DC | PRN

## 2019-03-31 MED ORDER — IOHEXOL 350 MG/ML SOLN
100.0000 mL | Freq: Once | INTRAVENOUS | Status: AC | PRN
  Administered 2019-03-31: 100 mL via INTRAVENOUS

## 2019-03-31 MED ORDER — ACETAMINOPHEN 650 MG RE SUPP: 650.0000 mg | Freq: Four times a day (QID) | RECTAL | Status: DC | PRN

## 2019-03-31 MED ORDER — SODIUM CHLORIDE 0.9% FLUSH
3.0000 mL | Freq: Two times a day (BID) | INTRAVENOUS | Status: DC
  Administered 2019-04-01 – 2019-04-04 (×7): 3 mL via INTRAVENOUS

## 2019-03-31 MED ORDER — LABETALOL HCL 5 MG/ML IV SOLN
10.0000 mg | INTRAVENOUS | Status: DC | PRN
  Administered 2019-04-02: 10 mg via INTRAVENOUS
  Filled 2019-03-31 (×3): qty 4

## 2019-03-31 MED ORDER — ONDANSETRON HCL 4 MG PO TABS: 4.0000 mg | ORAL_TABLET | Freq: Four times a day (QID) | ORAL | Status: DC | PRN

## 2019-03-31 MED ORDER — LABETALOL HCL 5 MG/ML IV SOLN
10.0000 mg | Freq: Once | INTRAVENOUS | Status: AC
  Administered 2019-03-31: 10 mg via INTRAVENOUS
  Filled 2019-03-31: qty 4

## 2019-03-31 MED ORDER — SIMVASTATIN 20 MG PO TABS: 20.0000 mg | ORAL_TABLET | Freq: Every day | ORAL | Status: DC

## 2019-03-31 MED ORDER — MORPHINE SULFATE (PF) 2 MG/ML IV SOLN: 2.0000 mg | INTRAVENOUS | Status: DC | PRN

## 2019-03-31 NOTE — ED Notes (Signed)
Patient transported to CT 

## 2019-03-31 NOTE — ED Notes (Signed)
EDP made aware of BP trends after medication administration.

## 2019-03-31 NOTE — Consult Note (Signed)
Reason for Consult:85 year old female with left hip pain Referring Physician: hospitalists  [name] Love is an 85 y.o. female.  HPI: the patient is a 85yo female who has fallen and complains of left hip pain.  She was seen in emergency room noted to have superior and inferior pubic ramus fractures.  Her consult for management.  She'll be admitted to internal medicine service.  Past Medical History:  Diagnosis Date  . Atrial fibrillation (HCC)   . Carotid artery occlusion   . Constipation 02/04/2008  . Diverticulosis of colon   . Dizziness   . Esophageal reflux   . Hyperlipidemia   . Hypertension   . Thyroid disease   . Urticaria   . Varicose veins     Past Surgical History:  Procedure Laterality Date  . APPENDECTOMY    . CAROTID ENDARTERECTOMY Left 1990    Family History  Problem Relation Age of Onset  . Heart attack Father   . Heart disease Father        After age 10  . Heart attack Brother   . Heart attack Brother   . Breast cancer Mother   . Hypertension Mother   . Varicose Veins Mother   . Cancer Sister        ? Breast    Social History:  reports that she has never smoked. She has never used smokeless tobacco. She reports current alcohol use. She reports that she does not use drugs.  Allergies: No Known Allergies  Medications: I have reviewed the patient's current medications.  Results for orders placed or performed during the hospital encounter of 03/31/19 (from the past 48 hour(s))  CBC with Differential/Platelet     Status: Abnormal   Collection Time: 03/31/19  5:59 PM  Result Value Ref Range   WBC 12.9 (H) 4.0 - 10.5 K/uL   RBC 4.12 3.87 - 5.11 MIL/uL   Hemoglobin 12.2 12.0 - 15.0 g/dL   HCT 16.1 09.6 - 04.5 %   MCV 94.7 80.0 - 100.0 fL   MCH 29.6 26.0 - 34.0 pg   MCHC 31.3 30.0 - 36.0 g/dL   RDW 40.9 81.1 - 91.4 %   Platelets 424 (H) 150 - 400 K/uL   nRBC 0.0 0.0 - 0.2 %   Neutrophils Relative % 85 %   Neutro Abs 10.9 (H) 1.7 - 7.7 K/uL    Lymphocytes Relative 6 %   Lymphs Abs 0.8 0.7 - 4.0 K/uL   Monocytes Relative 6 %   Monocytes Absolute 0.8 0.1 - 1.0 K/uL   Eosinophils Relative 1 %   Eosinophils Absolute 0.1 0.0 - 0.5 K/uL   Basophils Relative 0 %   Basophils Absolute 0.1 0.0 - 0.1 K/uL   Immature Granulocytes 2 %   Abs Immature Granulocytes 0.22 (H) 0.00 - 0.07 K/uL    Comment: Performed at [facility], [address] [place], [place] [address]  Basic metabolic panel     Status: Abnormal   Collection Time: 03/31/19  5:59 PM  Result Value Ref Range   Sodium 140 135 - 145 mmol/L   Potassium 3.8 3.5 - 5.1 mmol/L   Chloride 100 98 - 111 mmol/L   CO2 26 22 - 32 mmol/L   Glucose, Bld 111 (H) 70 - 99 mg/dL   BUN 25 (H) 8 - 23 mg/dL   Creatinine, Ser 6.21 (H) 0.44 - 1.00 mg/dL   Calcium 9.8 8.9 - 30.8 mg/dL   GFR calc non Af Denyse Dago  46 (L) >60 mL/min   GFR calc Af Amer 53 (L) >60 mL/min   Anion gap 14 5 - 15    Comment: Performed at [facility], [address], [place], [place] [address]  Troponin I (High Sensitivity)     Status: Abnormal   Collection Time: 03/31/19  5:59 PM  Result Value Ref Range   Troponin I (High Sensitivity) 26 (H) <18 ng/L    Comment: (NOTE) Elevated high sensitivity troponin I (hsTnI) values and significant  changes across serial measurements may suggest ACS but many other  chronic and acute conditions are known to elevate hsTnI results.  Refer to the "Links" section for chest pain algorithms and additional  guidance. Performed at [facility], [address] [place], [place] [address]   Troponin I (High Sensitivity)     Status: Abnormal   Collection Time: 03/31/19  6:38 PM  Result Value Ref Range   Troponin I (High Sensitivity) 25 (H) <18 ng/L    Comment: (NOTE) Elevated high sensitivity troponin I (hsTnI) values and significant  changes across serial measurements may suggest ACS but many other  chronic and acute conditions are  known to elevate hsTnI results.  Refer to the "Links" section for chest pain algorithms and additional  guidance. Performed at [facility], [address], [place], [place] [address]     Dg Chest 1 View  Result Date: 03/31/2019 CLINICAL DATA:  Fall EXAM: CHEST  1 VIEW COMPARISON:  03/17/2017 FINDINGS: Normal heart size. No pericardial effusion. Diffuse coarsened interstitial markings identified bilaterally. No superimposed pleural effusion, pulmonary edema or airspace consolidation. IMPRESSION: No active disease. Electronically Signed   By: Signa Kell M.D.   On: 03/31/2019 17:45   Dg Lumbar Spine Complete  Result Date: 03/31/2019 CLINICAL DATA:  Per EMS, patient from [facility] assisted living, c/o left hip pain after trip and fall. Pt c/o left knee pain. Best images obtained. EXAM: LUMBAR SPINE - COMPLETE 4+ VIEW COMPARISON:  08/02/2008 FINDINGS: There is marked degenerative change throughout the lumbar spine. Significant disc height loss and osteophytes throughout all levels. Mild scoliosis, mild mid lumbar scoliosis is stable. There has been wedge compression fracture of T11 since the prior study. This is only seen on the anterior and oblique images. The age of this fracture is not determined. There is atherosclerotic calcification of the abdominal aorta. Visualized bowel gas pattern is nonobstructive. IMPRESSION: 1. T11 compression fracture, new since the prior study and of indeterminate age. Given the history of fall, consider thoracic spine series. 2. Marked degenerative changes throughout the lumbar spine. Electronically Signed   By: Norva Pavlov M.D.   On: 03/31/2019 17:47   Ct Head Wo Contrast  Result Date: 03/31/2019 CLINICAL DATA:  Per EMS, patient from [facility] assisted living, c/o left hip pain after trip and fall. Denies LOC. No deformity noted. Denies taking blood thinners. Answers questions appropriately EXAM: CT HEAD WITHOUT CONTRAST CT CERVICAL  SPINE WITHOUT CONTRAST TECHNIQUE: Multidetector CT imaging of the head and cervical spine was performed following the standard protocol without intravenous contrast. Multiplanar CT image reconstructions of the cervical spine were also generated. COMPARISON:  03/31/2017 FINDINGS: CT HEAD FINDINGS Brain: There is central and cortical atrophy. Periventricular white matter changes are consistent with small vessel disease. There is no intra or extra-axial fluid collection or mass lesion. The basilar cisterns and ventricles have a normal appearance. There is no CT evidence for acute infarction or hemorrhage. Vascular: There  is atherosclerotic calcification of the internal carotid arteries. No hyperdense vessels. Skull: Normal. Negative for fracture or focal lesion. Sinuses/Orbits: Paranasal sinus mucosal thickening. No air-fluid levels or evidence for sinus wall fractures. Other: LEFT parietal scalp edema/hematoma without underlying fracture. CT CERVICAL SPINE FINDINGS Alignment: Normal. Mild convex RIGHT scoliosis Skull base and vertebrae: No acute fracture. No primary bone lesion or focal pathologic process. Soft tissues and spinal canal: No prevertebral fluid or swelling. No visible canal hematoma. Disc levels: Significant degenerative changes throughout the cervical spine, most notably at C4-5 and C5-6. Upper chest: Biapical pleuroparenchymal changes at the lung apices. Other: There is dense atherosclerotic calcification of the carotid arteries. IMPRESSION: 1. Atrophy and small vessel disease. 2. No evidence for acute intracranial abnormality. 3. LEFT parietal scalp edema/hematoma without underlying fracture. 4. Significant degenerative changes in the cervical spine. 5. No evidence for acute cervical spine abnormality. 6. Dense atherosclerotic calcification of the common and internal carotid arteries. Electronically Signed   By: Nolon Nations M.D.   On: 03/31/2019 18:06   Ct Cervical Spine Wo Contrast  Result  Date: 03/31/2019 CLINICAL DATA:  Per EMS, patient from [facility] assisted living, c/o left hip pain after trip and fall. Denies LOC. No deformity noted. Denies taking blood thinners. Answers questions appropriately EXAM: CT HEAD WITHOUT CONTRAST CT CERVICAL SPINE WITHOUT CONTRAST TECHNIQUE: Multidetector CT imaging of the head and cervical spine was performed following the standard protocol without intravenous contrast. Multiplanar CT image reconstructions of the cervical spine were also generated. COMPARISON:  03/31/2017 FINDINGS: CT HEAD FINDINGS Brain: There is central and cortical atrophy. Periventricular white matter changes are consistent with small vessel disease. There is no intra or extra-axial fluid collection or mass lesion. The basilar cisterns and ventricles have a normal appearance. There is no CT evidence for acute infarction or hemorrhage. Vascular: There is atherosclerotic calcification of the internal carotid arteries. No hyperdense vessels. Skull: Normal. Negative for fracture or focal lesion. Sinuses/Orbits: Paranasal sinus mucosal thickening. No air-fluid levels or evidence for sinus wall fractures. Other: LEFT parietal scalp edema/hematoma without underlying fracture. CT CERVICAL SPINE FINDINGS Alignment: Normal. Mild convex RIGHT scoliosis Skull base and vertebrae: No acute fracture. No primary bone lesion or focal pathologic process. Soft tissues and spinal canal: No prevertebral fluid or swelling. No visible canal hematoma. Disc levels: Significant degenerative changes throughout the cervical spine, most notably at C4-5 and C5-6. Upper chest: Biapical pleuroparenchymal changes at the lung apices. Other: There is dense atherosclerotic calcification of the carotid arteries. IMPRESSION: 1. Atrophy and small vessel disease. 2. No evidence for acute intracranial abnormality. 3. LEFT parietal scalp edema/hematoma without underlying fracture. 4. Significant degenerative changes in the cervical  spine. 5. No evidence for acute cervical spine abnormality. 6. Dense atherosclerotic calcification of the common and internal carotid arteries. Electronically Signed   By: Nolon Nations M.D.   On: 03/31/2019 18:06   Ct T-spine No Charge  Result Date: 03/31/2019 CLINICAL DATA:  Blood fall, back pain, hypertension EXAM: CT LUMBAR SPINE WITHOUT CONTRAST TECHNIQUE: Multidetector CT imaging of the lumbar spine was performed without intravenous contrast administration. Multiplanar CT image reconstructions were also generated. COMPARISON:  None. FINDINGS: Thoracic spine: Alignment: Normal Vertebrae: There is a superior compression deformity of the T11 vertebral body with approximately 75% loss in vertebral body height. There is buckling of the anterior superior and posterosuperior cortex is. No retropulsion of fragments is seen. Paraspinal and other soft tissues: The paraspinal soft tissues and visualized retroperitoneal structures are unremarkable. The  sacroiliac joints are intact. Biapical scarring/atelectasis is seen. Disc levels: Mild disc height loss with facet arthrosis is seen throughout the thoracic spine. At T10-T11 there is buckling of the superior cortex of T11 which causes mild canal narrowing. There is also mild neural foraminal narrowing. Lumbar spine: Segmentation: There are 5 non-rib bearing lumbar type vertebral bodies with the last intervertebral disc space labeled as L5-S1. Alignment: There is a mild S-shaped scoliotic curvature of the lumbar spine. Slight lateral listhesis of L2 on L3 and L3 on L4 seen. Vertebrae: The vertebral body heights are well maintained. No fracture, malalignment, or pathologic osseous lesions seen. Paraspinal and other soft tissues: The paraspinal soft tissues and visualized retroperitoneal structures are unremarkable. The sacroiliac joints are intact. Scattered dense aortic atherosclerosis is noted. Disc levels: There is disc height loss with facet arthrosis and vacuum  phenomenon seen throughout the lumbar spine. This is most notable at L2-L3 and L3-L4 with severe left neural foraminal narrowing and moderate central canal stenosis. IMPRESSION: 1. Age indeterminate superior compression deformity of the T11 vertebral body with 75% loss in vertebral body height. This is new since prior exam of 2018. There is buckling of the superior cortex. However no retropulsion of fragments. 2. Mild S-shaped scoliotic curvature of the lumbar spine. 3. Lumbar spine spondylosis most notable at L2-L3 and L3-L4 with severe left neural foraminal narrowing and moderate central canal stenosis. Electronically Signed   By: Jonna ClarkBindu  Avutu M.D.   On: 03/31/2019 20:06   Ct L-spine No Charge  Result Date: 03/31/2019 CLINICAL DATA:  Blood fall, back pain, hypertension EXAM: CT LUMBAR SPINE WITHOUT CONTRAST TECHNIQUE: Multidetector CT imaging of the lumbar spine was performed without intravenous contrast administration. Multiplanar CT image reconstructions were also generated. COMPARISON:  None. FINDINGS: Thoracic spine: Alignment: Normal Vertebrae: There is a superior compression deformity of the T11 vertebral body with approximately 75% loss in vertebral body height. There is buckling of the anterior superior and posterosuperior cortex is. No retropulsion of fragments is seen. Paraspinal and other soft tissues: The paraspinal soft tissues and visualized retroperitoneal structures are unremarkable. The sacroiliac joints are intact. Biapical scarring/atelectasis is seen. Disc levels: Mild disc height loss with facet arthrosis is seen throughout the thoracic spine. At T10-T11 there is buckling of the superior cortex of T11 which causes mild canal narrowing. There is also mild neural foraminal narrowing. Lumbar spine: Segmentation: There are 5 non-rib bearing lumbar type vertebral bodies with the last intervertebral disc space labeled as L5-S1. Alignment: There is a mild S-shaped scoliotic curvature of the  lumbar spine. Slight lateral listhesis of L2 on L3 and L3 on L4 seen. Vertebrae: The vertebral body heights are well maintained. No fracture, malalignment, or pathologic osseous lesions seen. Paraspinal and other soft tissues: The paraspinal soft tissues and visualized retroperitoneal structures are unremarkable. The sacroiliac joints are intact. Scattered dense aortic atherosclerosis is noted. Disc levels: There is disc height loss with facet arthrosis and vacuum phenomenon seen throughout the lumbar spine. This is most notable at L2-L3 and L3-L4 with severe left neural foraminal narrowing and moderate central canal stenosis. IMPRESSION: 1. Age indeterminate superior compression deformity of the T11 vertebral body with 75% loss in vertebral body height. This is new since prior exam of 2018. There is buckling of the superior cortex. However no retropulsion of fragments. 2. Mild S-shaped scoliotic curvature of the lumbar spine. 3. Lumbar spine spondylosis most notable at L2-L3 and L3-L4 with severe left neural foraminal narrowing and moderate central canal stenosis.  Electronically Signed   By: Jonna Clark M.D.   On: 03/31/2019 20:06   Dg Knee Complete 4 Views Left  Result Date: 03/31/2019 CLINICAL DATA:  Fall EXAM: LEFT KNEE - COMPLETE 4+ VIEW COMPARISON:  None FINDINGS: Osseous demineralization. Extensive chondrocalcinosis. Minimal joint space narrowing. No acute fracture, dislocation, or bone destruction. Scattered atherosclerotic calcifications. No knee joint effusion. IMPRESSION: Osseous demineralization with minimal degenerative changes and scattered chondrocalcinosis question CPPD. No acute osseous abnormalities. Electronically Signed   By: Ulyses Southward M.D.   On: 03/31/2019 17:47   Ct Angio Chest/abd/pel For Dissection W And/or Wo Contrast  Result Date: 03/31/2019 CLINICAL DATA:  85 year old with back pain after trip and fall. Minor trauma. Hypertension. Rule out dissection. EXAM: CT ANGIOGRAPHY  CHEST, ABDOMEN AND PELVIS TECHNIQUE: Multidetector CT imaging through the chest, abdomen and pelvis was performed using the standard protocol during bolus administration of intravenous contrast. Multiplanar reconstructed images and MIPs were obtained and reviewed to evaluate the vascular anatomy. CONTRAST:  OMNIPAQUE IOHEXOL 350 MG/ML SOLN COMPARISON:  Chest radiograph earlier this day. FINDINGS: CTA CHEST FINDINGS Cardiovascular: Noncontrast CT demonstrates no aortic hematoma. No aneurysm. Moderate atherosclerosis of the thoracic aorta and branch vessels. Post contrast there is no evidence of acute aortic injury, dissection, vasculitis or acute aortic syndrome. No filling defects in the central most pulmonary arteries to the lobar level. There are coronary artery calcifications. Borderline cardiomegaly. No pericardial effusion. Mediastinum/Nodes: Calcified mediastinal lymph nodes consistent with prior granulomatous disease. No noncalcified adenopathy. No aortic hematoma or hemorrhage. No pneumomediastinum. No esophageal wall thickening. No thyroid nodule. Lungs/Pleura: No pneumothorax. Biapical pleuroparenchymal scarring. Mild dependent atelectasis. Small intraparenchymal cyst in the left lower lobe specific clinical significance. No pulmonary edema or pleural effusion. Calcified granuloma in the right lower lobe. Trachea and mainstem bronchi are patent, mild motion artifact limitations. Musculoskeletal: Thoracic spine better assessed on dedicated thoracic spine reformats. T11 compression fracture. No acute fracture of the sternum, ribs, included clavicles or shoulder girdles. Review of the MIP images confirms the above findings. CTA ABDOMEN AND PELVIS FINDINGS VASCULAR Aorta: Atherosclerotic and tortuous. No dissection or evidence of acute injury. No frank aneurysmal dilatation. Celiac: No dissection or acute injury. Calcified plaque at the origin causes approximately 50% luminal stenosis. SMA: No dissection  or acute injury. Calcified plaque at the origin causes approximately 50% luminal narrowing. Branch vessels are patent. Renals: Plaque at the origin of both renal arteries. Approximately 50% luminal narrowing the the origin of both renal arteries, left greater than right. IMA: Patent without evidence of aneurysm, dissection, vasculitis or significant stenosis. Inflow: Atherosclerotic and tortuous. No dissection or acute abnormality. Veins: No obvious venous abnormality within the limitations of this arterial phase study. Review of the MIP images confirms the above findings. NON-VASCULAR Hepatobiliary: Arterial phase imaging limits evaluation for acute injury. No evidence of focal hepatic abnormality. No perihepatic hematoma. Gallbladder physiologically distended, no calcified stone. No biliary dilatation. Pancreas: No ductal dilatation or inflammation. Spleen: Normal in size and arterial phase enhancement. Mild motion artifact limits detailed assessment. No perisplenic hematoma. Adrenals/Urinary Tract: No renal or adrenal hemorrhage. No hydronephrosis. No perinephric edema. Distended urinary bladder without wall thickening. Stomach/Bowel: Colonic diverticulosis, advanced in the descending and sigmoid colon. Motion artifact limits detailed bowel assessment. No evidence of bowel inflammation or obstruction. No mesenteric hematoma. Lymphatic: No enlarged lymph nodes in the abdomen or pelvis. Reproductive: Uterus and bilateral adnexa are unremarkable. Other: No free air or free fluid. No confluent body wall contusion. Musculoskeletal: Minimally displaced  left superior pubic ramus fracture extending to the pubic body. Nondisplaced left inferior pubic ramus fracture. Left hip joint remains intact. Lumbar spine better assessed on dedicated spine reformats. There are enthesopathic changes of the ischial tuberosities. Review of the MIP images confirms the above findings. IMPRESSION: 1. No aortic dissection or acute aortic  injury. Diffuse aortic tortuosity and atherosclerosis. 2. Minimally displaced left superior and nondisplaced inferior pubic ramus fractures. 3. No additional acute traumatic injury to the chest, abdomen, or pelvis, allowing for limitations related to arterial phase imaging. 4. Colonic diverticulosis. 5. Thoracic and lumbar spine assessed on separately reported spine reformats. Aortic Atherosclerosis (ICD10-I70.0). Electronically Signed   By: Narda Rutherford M.D.   On: 03/31/2019 20:07   Dg Hip Unilat W Or Wo Pelvis 2-3 Views Left  Result Date: 03/31/2019 CLINICAL DATA:  Left hip pain after fall. EXAM: DG HIP (WITH OR WITHOUT PELVIS) 2-3V LEFT COMPARISON:  None. FINDINGS: Mild diffuse osteopenia. Degenerative changes identified within the lumbar spine and both hips. No acute fracture or dislocation. IMPRESSION: 1. No acute findings. 2. Osteopenia and degenerative changes. Electronically Signed   By: Signa Kell M.D.   On: 03/31/2019 17:47    ROS  ROS: I have reviewed the patient's review of systems thoroughly and there are no positive responses as relates to the HPI. Blood pressure 130/68, pulse 81, temperature 98.2 F (36.8 C), temperature source Oral, resp. rate 19, SpO2 95 %. Physical Exam Well-developed well-nourished patient in no acute distress. Alert and oriented x3 HEENT:within normal limits Cardiac: Regular rate and rhythm Pulmonary: Lungs clear to auscultation Abdomen: Soft and nontender.  Normal active bowel sounds  Musculoskeletal: (aafter: Painful range of motion.  Limited range of motion.  Tender to palpation with compression.) Assessment/Plan: A 85 year old female with a minimally displaced superior pubic ramus fracture and nondisplaced inferior pubic ramus fracture left side.  There is no indication for surgical intervention.  The patient will be treated with weightbearing as tolerated ambulation with a walker.  We will be happy to follow her while in the hospital and will  see her back in the office in 2 weeks once discharge.  Harvie Junior 03/31/2019, 11:02 PM

## 2019-03-31 NOTE — ED Notes (Signed)
Patient transported to X-ray 

## 2019-03-31 NOTE — ED Notes (Signed)
MD aware of trending BP.

## 2019-03-31 NOTE — ED Provider Notes (Signed)
[facility]-EMERGENCY DEPT Provider Note   CSN: [id number] Arrival date & time: 03/31/19  1550     History   Chief Complaint Chief Complaint  Patient presents with   Hip Pain    HPI [name] Love is a 85 y.o. female hx of afib not on blood thinners, here with fall.  Patient is from a nursing home.  Patient is unclear how she fell but may have been a mechanical fall and landed on the left hip.  Patient is complaining of some severe left hip pain.  Unclear if she had a head injury or not.  Patient was noted to be hypertensive with systolic around 200 in triage. Patient arrived by EMS and refused pain meds with EMS.       The history is provided by the patient.    Past Medical History:  Diagnosis Date   Atrial fibrillation (HCC)    Carotid artery occlusion    Constipation 02/04/2008   Diverticulosis of colon    Dizziness    Esophageal reflux    Hyperlipidemia    Hypertension    Thyroid disease    Urticaria    Varicose veins     Patient Active Problem List   Diagnosis Date Noted   Pubic ramus fracture, left, closed, initial encounter (HCC) 03/31/2019   Elevated troponin 03/31/2019   Hypertensive urgency 03/31/2019   Acute respiratory failure with hypoxia (HCC) 03/31/2019   Hypothyroidism 03/31/2019   CKD (chronic kidney disease), stage III 03/31/2019   Orthostatic hypotension 03/31/2019   Carotid stenosis 03/09/2014   Aftercare following surgery of the circulatory system 03/09/2014   Aftercare following surgery of the circulatory system, NEC 03/08/2013   Occlusion and stenosis of carotid artery without mention of cerebral infarction 02/04/2012    Past Surgical History:  Procedure Laterality Date   APPENDECTOMY     CAROTID ENDARTERECTOMY Left 1990     OB History   No obstetric history on file.      Home Medications    Prior to Admission medications   Medication Sig Start Date End Date Taking? Authorizing  Provider  acetaminophen (TYLENOL) 500 MG tablet Take 500 mg by mouth every 6 (six) hours as needed for mild pain, moderate pain, fever or headache.   Yes [provider]  Calcium Carbonate-Vitamin D (OSCAL 500/200 D-3 PO) Take 1 tablet by mouth daily.   Yes [provider]  fludrocortisone (FLORINEF) 0.1 MG tablet Take 0.1 mg by mouth 2 (two) times daily.   Yes [provider]  levothyroxine (SYNTHROID) 112 MCG tablet Take 112 mcg by mouth daily before breakfast.   Yes [provider]  Melatonin 3 MG TABS Take 3 mg by mouth at bedtime.   Yes [provider]  Multiple Vitamin (MULTIVITAMIN WITH MINERALS) TABS tablet Take 1 tablet by mouth daily.   Yes [provider]  potassium chloride (MICRO-K) 10 MEQ CR capsule Take 10 mEq by mouth daily. With food and 4-8 oz liquid **Do NOT crush**   Yes [provider]  Saccharomyces boulardii (FLORASTOR PO) Take 1 tablet by mouth daily.   Yes [provider]  aspirin 81 MG tablet Take 81 mg by mouth daily.    [provider]    Family History Family History  Problem Relation Age of Onset   Heart attack Father    Heart disease Father        After age 36   Heart attack Brother    Heart  attack Brother    Breast cancer Mother    Hypertension Mother    Varicose Veins Mother    Cancer Sister        ? Breast    Social History Social History   Tobacco Use   Smoking status: Never Smoker   Smokeless tobacco: Never Used  Substance Use Topics   Alcohol use: Yes    Alcohol/week: 0.0 standard drinks    Comment: a glass of wine every so often per pt   Drug use: No     Allergies   Patient has no known allergies.   Review of Systems Review of Systems  Musculoskeletal:       L hip pain   All other systems reviewed and are negative.    Physical Exam Updated Vital Signs BP 126/64 (BP Location: Left Arm)    Pulse 78    Temp 98.2 F (36.8 C) (Oral)     Resp 19    SpO2 95%   Physical Exam Vitals signs and nursing note reviewed.  Constitutional:      Comments: Uncomfortable, chronically ill   HENT:     Head:     Comments: No obvious scalp hematoma     Mouth/Throat:     Mouth: Mucous membranes are moist.  Eyes:     Extraocular Movements: Extraocular movements intact.     Pupils: Pupils are equal, round, and reactive to light.  Neck:     Musculoskeletal: Normal range of motion.  Cardiovascular:     Rate and Rhythm: Normal rate and regular rhythm.  Pulmonary:     Effort: Pulmonary effort is normal.     Breath sounds: Normal breath sounds.  Abdominal:     General: Abdomen is flat.     Palpations: Abdomen is soft.  Musculoskeletal:     Comments: Unable to range the L hip, mild L paralumbar tenderness, mild L knee tenderness   Skin:    General: Skin is warm.     Capillary Refill: Capillary refill takes less than 2 seconds.  Neurological:     General: No focal deficit present.     Mental Status: She is alert.     Comments: Demented, moving all extremities   Psychiatric:        Mood and Affect: Mood normal.      ED Treatments / Results  Labs (all labs ordered are listed, but only abnormal results are displayed) Labs Reviewed  CBC WITH DIFFERENTIAL/PLATELET - Abnormal; Notable for the following components:      Result Value   WBC 12.9 (*)    Platelets 424 (*)    Neutro Abs 10.9 (*)    Abs Immature Granulocytes 0.22 (*)    All other components within normal limits  BASIC METABOLIC PANEL - Abnormal; Notable for the following components:   Glucose, Bld 111 (*)    BUN 25 (*)    Creatinine, Ser 1.05 (*)    GFR calc non Af Amer 46 (*)    GFR calc Af Amer 53 (*)    All other components within normal limits  TROPONIN I (HIGH SENSITIVITY) - Abnormal; Notable for the following components:   Troponin I (High Sensitivity) 26 (*)    All other components within normal limits  TROPONIN I (HIGH SENSITIVITY) - Abnormal; Notable for  the following components:   Troponin I (High Sensitivity) 25 (*)    All other components within normal limits  SARS CORONAVIRUS 2 (TAT 6-24 HRS)  EKG EKG Interpretation  Date/Time:  Thursday March 31 2019 18:37:32 EDT Ventricular Rate:  92 PR Interval:    QRS Duration: 82 QT Interval:  388 QTC Calculation: 480 R Axis:   56 Text Interpretation:  Sinus rhythm Biatrial enlargement Left ventricular hypertrophy ST elevation, consider anterior injury Baseline wander in lead(s) V1 No significant change since last tracing Confirmed by Richardean CanalYao, Lani Mendiola H [phone]([id number]) on 03/31/2019 6:39:44 PM   Radiology Dg Chest 1 View  Result Date: 03/31/2019 CLINICAL DATA:  Fall EXAM: CHEST  1 VIEW COMPARISON:  03/17/2017 FINDINGS: Normal heart size. No pericardial effusion. Diffuse coarsened interstitial markings identified bilaterally. No superimposed pleural effusion, pulmonary edema or airspace consolidation. IMPRESSION: No active disease. Electronically Signed   By: Signa Kellaylor  Stroud M.D.   On: 03/31/2019 17:45   Dg Lumbar Spine Complete  Result Date: 03/31/2019 CLINICAL DATA:  [facility] c/o left hip pain after trip and fall. Pt c/o left knee pain. Best images obtained. EXAM: LUMBAR SPINE - COMPLETE 4+ VIEW COMPARISON:  08/02/2008 FINDINGS: There is marked degenerative change throughout the lumbar spine. Significant disc height loss and osteophytes throughout all levels. Mild scoliosis, mild mid lumbar scoliosis is stable. There has been wedge compression fracture of T11 since the prior study. This is only seen on the anterior and oblique images. The age of this fracture is not determined. There is atherosclerotic calcification of the abdominal aorta. Visualized bowel gas pattern is nonobstructive. IMPRESSION: 1. T11 compression fracture, new since the prior study and of indeterminate age. Given the history of fall, consider thoracic spine series. 2. Marked degenerative  changes throughout the lumbar spine. Electronically Signed   By: Norva PavlovElizabeth  Brown M.D.   On: 03/31/2019 17:47   Ct Head Wo Contrast  Result Date: 03/31/2019 CLINICAL DATA:  [facility] EMS[facility] c/o left hip pain after trip and fall. Denies LOC. No deformity noted. Denies taking blood thinners. Answers questions appropriately EXAM: CT HEAD WITHOUT CONTRAST CT CERVICAL SPINE WITHOUT CONTRAST TECHNIQUE: Multidetector CT imaging of the head and cervical spine was performed following the standard protocol without intravenous contrast. Multiplanar CT image reconstructions of the cervical spine were also generated. COMPARISON:  03/31/2017 FINDINGS: CT HEAD FINDINGS Brain: There is central and cortical atrophy. Periventricular white matter changes are consistent with small vessel disease. There is no intra or extra-axial fluid collection or mass lesion. The basilar cisterns and ventricles have a normal appearance. There is no CT evidence for acute infarction or hemorrhage. Vascular: There is atherosclerotic calcification of the internal carotid arteries. No hyperdense vessels. Skull: Normal. Negative for fracture or focal lesion. Sinuses/Orbits: Paranasal sinus mucosal thickening. No air-fluid levels or evidence for sinus wall fractures. Other: LEFT parietal scalp edema/hematoma without underlying fracture. CT CERVICAL SPINE FINDINGS Alignment: Normal. Mild convex RIGHT scoliosis Skull base and vertebrae: No acute fracture. No primary bone lesion or focal pathologic process. Soft tissues and spinal canal: No prevertebral fluid or swelling. No visible canal hematoma. Disc levels: Significant degenerative changes throughout the cervical spine, most notably at C4-5 and C5-6. Upper chest: Biapical pleuroparenchymal changes at the lung apices. Other: There is dense atherosclerotic calcification of the carotid arteries. IMPRESSION: 1. Atrophy and small vessel disease. 2. No evidence for acute  intracranial abnormality. 3. LEFT parietal scalp edema/hematoma without underlying fracture. 4. Significant degenerative changes in the cervical spine. 5. No evidence for acute cervical spine abnormality. 6. Dense atherosclerotic calcification of the common and internal carotid arteries.  Electronically Signed   By: Norva Pavlov M.D.   On: 03/31/2019 18:06   Ct Cervical Spine Wo Contrast  Result Date: 03/31/2019 CLINICAL DATA:  Per EMS, patient from [facility] assisted living, c/o left hip pain after trip and fall. Denies LOC. No deformity noted. Denies taking blood thinners. Answers questions appropriately EXAM: CT HEAD WITHOUT CONTRAST CT CERVICAL SPINE WITHOUT CONTRAST TECHNIQUE: Multidetector CT imaging of the head and cervical spine was performed following the standard protocol without intravenous contrast. Multiplanar CT image reconstructions of the cervical spine were also generated. COMPARISON:  03/31/2017 FINDINGS: CT HEAD FINDINGS Brain: There is central and cortical atrophy. Periventricular white matter changes are consistent with small vessel disease. There is no intra or extra-axial fluid collection or mass lesion. The basilar cisterns and ventricles have a normal appearance. There is no CT evidence for acute infarction or hemorrhage. Vascular: There is atherosclerotic calcification of the internal carotid arteries. No hyperdense vessels. Skull: Normal. Negative for fracture or focal lesion. Sinuses/Orbits: Paranasal sinus mucosal thickening. No air-fluid levels or evidence for sinus wall fractures. Other: LEFT parietal scalp edema/hematoma without underlying fracture. CT CERVICAL SPINE FINDINGS Alignment: Normal. Mild convex RIGHT scoliosis Skull base and vertebrae: No acute fracture. No primary bone lesion or focal pathologic process. Soft tissues and spinal canal: No prevertebral fluid or swelling. No visible canal hematoma. Disc levels: Significant degenerative changes throughout the  cervical spine, most notably at C4-5 and C5-6. Upper chest: Biapical pleuroparenchymal changes at the lung apices. Other: There is dense atherosclerotic calcification of the carotid arteries. IMPRESSION: 1. Atrophy and small vessel disease. 2. No evidence for acute intracranial abnormality. 3. LEFT parietal scalp edema/hematoma without underlying fracture. 4. Significant degenerative changes in the cervical spine. 5. No evidence for acute cervical spine abnormality. 6. Dense atherosclerotic calcification of the common and internal carotid arteries. Electronically Signed   By: Norva Pavlov M.D.   On: 03/31/2019 18:06   Ct T-spine No Charge  Result Date: 03/31/2019 CLINICAL DATA:  Blood fall, back pain, hypertension EXAM: CT LUMBAR SPINE WITHOUT CONTRAST TECHNIQUE: Multidetector CT imaging of the lumbar spine was performed without intravenous contrast administration. Multiplanar CT image reconstructions were also generated. COMPARISON:  None. FINDINGS: Thoracic spine: Alignment: Normal Vertebrae: There is a superior compression deformity of the T11 vertebral body with approximately 75% loss in vertebral body height. There is buckling of the anterior superior and posterosuperior cortex is. No retropulsion of fragments is seen. Paraspinal and other soft tissues: The paraspinal soft tissues and visualized retroperitoneal structures are unremarkable. The sacroiliac joints are intact. Biapical scarring/atelectasis is seen. Disc levels: Mild disc height loss with facet arthrosis is seen throughout the thoracic spine. At T10-T11 there is buckling of the superior cortex of T11 which causes mild canal narrowing. There is also mild neural foraminal narrowing. Lumbar spine: Segmentation: There are 5 non-rib bearing lumbar type vertebral bodies with the last intervertebral disc space labeled as L5-S1. Alignment: There is a mild S-shaped scoliotic curvature of the lumbar spine. Slight lateral listhesis of L2 on L3 and L3  on L4 seen. Vertebrae: The vertebral body heights are well maintained. No fracture, malalignment, or pathologic osseous lesions seen. Paraspinal and other soft tissues: The paraspinal soft tissues and visualized retroperitoneal structures are unremarkable. The sacroiliac joints are intact. Scattered dense aortic atherosclerosis is noted. Disc levels: There is disc height loss with facet arthrosis and vacuum phenomenon seen throughout the lumbar spine. This is most notable at L2-L3 and L3-L4 with severe left neural  foraminal narrowing and moderate central canal stenosis. IMPRESSION: 1. Age indeterminate superior compression deformity of the T11 vertebral body with 75% loss in vertebral body height. This is new since prior exam of 2018. There is buckling of the superior cortex. However no retropulsion of fragments. 2. Mild S-shaped scoliotic curvature of the lumbar spine. 3. Lumbar spine spondylosis most notable at L2-L3 and L3-L4 with severe left neural foraminal narrowing and moderate central canal stenosis. Electronically Signed   By: Jonna Clark M.D.   On: 03/31/2019 20:06   Ct L-spine No Charge  Result Date: 03/31/2019 CLINICAL DATA:  Blood fall, back pain, hypertension EXAM: CT LUMBAR SPINE WITHOUT CONTRAST TECHNIQUE: Multidetector CT imaging of the lumbar spine was performed without intravenous contrast administration. Multiplanar CT image reconstructions were also generated. COMPARISON:  None. FINDINGS: Thoracic spine: Alignment: Normal Vertebrae: There is a superior compression deformity of the T11 vertebral body with approximately 75% loss in vertebral body height. There is buckling of the anterior superior and posterosuperior cortex is. No retropulsion of fragments is seen. Paraspinal and other soft tissues: The paraspinal soft tissues and visualized retroperitoneal structures are unremarkable. The sacroiliac joints are intact. Biapical scarring/atelectasis is seen. Disc levels: Mild disc height loss  with facet arthrosis is seen throughout the thoracic spine. At T10-T11 there is buckling of the superior cortex of T11 which causes mild canal narrowing. There is also mild neural foraminal narrowing. Lumbar spine: Segmentation: There are 5 non-rib bearing lumbar type vertebral bodies with the last intervertebral disc space labeled as L5-S1. Alignment: There is a mild S-shaped scoliotic curvature of the lumbar spine. Slight lateral listhesis of L2 on L3 and L3 on L4 seen. Vertebrae: The vertebral body heights are well maintained. No fracture, malalignment, or pathologic osseous lesions seen. Paraspinal and other soft tissues: The paraspinal soft tissues and visualized retroperitoneal structures are unremarkable. The sacroiliac joints are intact. Scattered dense aortic atherosclerosis is noted. Disc levels: There is disc height loss with facet arthrosis and vacuum phenomenon seen throughout the lumbar spine. This is most notable at L2-L3 and L3-L4 with severe left neural foraminal narrowing and moderate central canal stenosis. IMPRESSION: 1. Age indeterminate superior compression deformity of the T11 vertebral body with 75% loss in vertebral body height. This is new since prior exam of 2018. There is buckling of the superior cortex. However no retropulsion of fragments. 2. Mild S-shaped scoliotic curvature of the lumbar spine. 3. Lumbar spine spondylosis most notable at L2-L3 and L3-L4 with severe left neural foraminal narrowing and moderate central canal stenosis. Electronically Signed   By: Jonna Clark M.D.   On: 03/31/2019 20:06   Dg Knee Complete 4 Views Left  Result Date: 03/31/2019 CLINICAL DATA:  Fall EXAM: LEFT KNEE - COMPLETE 4+ VIEW COMPARISON:  None FINDINGS: Osseous demineralization. Extensive chondrocalcinosis. Minimal joint space narrowing. No acute fracture, dislocation, or bone destruction. Scattered atherosclerotic calcifications. No knee joint effusion. IMPRESSION: Osseous demineralization  with minimal degenerative changes and scattered chondrocalcinosis question CPPD. No acute osseous abnormalities. Electronically Signed   By: Ulyses Southward M.D.   On: 03/31/2019 17:47   Ct Angio Chest/abd/pel For Dissection W And/or Wo Contrast  Result Date: 03/31/2019 CLINICAL DATA:  85 year old with back pain after trip and fall. Minor trauma. Hypertension. Rule out dissection. EXAM: CT ANGIOGRAPHY CHEST, ABDOMEN AND PELVIS TECHNIQUE: Multidetector CT imaging through the chest, abdomen and pelvis was performed using the standard protocol during bolus administration of intravenous contrast. Multiplanar reconstructed images and MIPs were obtained and reviewed  to evaluate the vascular anatomy. CONTRAST:  154mL OMNIPAQUE IOHEXOL 350 MG/ML SOLN COMPARISON:  Chest radiograph earlier this day. FINDINGS: CTA CHEST FINDINGS Cardiovascular: Noncontrast CT demonstrates no aortic hematoma. No aneurysm. Moderate atherosclerosis of the thoracic aorta and branch vessels. Post contrast there is no evidence of acute aortic injury, dissection, vasculitis or acute aortic syndrome. No filling defects in the central most pulmonary arteries to the lobar level. There are coronary artery calcifications. Borderline cardiomegaly. No pericardial effusion. Mediastinum/Nodes: Calcified mediastinal lymph nodes consistent with prior granulomatous disease. No noncalcified adenopathy. No aortic hematoma or hemorrhage. No pneumomediastinum. No esophageal wall thickening. No thyroid nodule. Lungs/Pleura: No pneumothorax. Biapical pleuroparenchymal scarring. Mild dependent atelectasis. Small intraparenchymal cyst in the left lower lobe specific clinical significance. No pulmonary edema or pleural effusion. Calcified granuloma in the right lower lobe. Trachea and mainstem bronchi are patent, mild motion artifact limitations. Musculoskeletal: Thoracic spine better assessed on dedicated thoracic spine reformats. T11 compression fracture. No acute  fracture of the sternum, ribs, included clavicles or shoulder girdles. Review of the MIP images confirms the above findings. CTA ABDOMEN AND PELVIS FINDINGS VASCULAR Aorta: Atherosclerotic and tortuous. No dissection or evidence of acute injury. No frank aneurysmal dilatation. Celiac: No dissection or acute injury. Calcified plaque at the origin causes approximately 50% luminal stenosis. SMA: No dissection or acute injury. Calcified plaque at the origin causes approximately 50% luminal narrowing. Branch vessels are patent. Renals: Plaque at the origin of both renal arteries. Approximately 50% luminal narrowing the the origin of both renal arteries, left greater than right. IMA: Patent without evidence of aneurysm, dissection, vasculitis or significant stenosis. Inflow: Atherosclerotic and tortuous. No dissection or acute abnormality. Veins: No obvious venous abnormality within the limitations of this arterial phase study. Review of the MIP images confirms the above findings. NON-VASCULAR Hepatobiliary: Arterial phase imaging limits evaluation for acute injury. No evidence of focal hepatic abnormality. No perihepatic hematoma. Gallbladder physiologically distended, no calcified stone. No biliary dilatation. Pancreas: No ductal dilatation or inflammation. Spleen: Normal in size and arterial phase enhancement. Mild motion artifact limits detailed assessment. No perisplenic hematoma. Adrenals/Urinary Tract: No renal or adrenal hemorrhage. No hydronephrosis. No perinephric edema. Distended urinary bladder without wall thickening. Stomach/Bowel: Colonic diverticulosis, advanced in the descending and sigmoid colon. Motion artifact limits detailed bowel assessment. No evidence of bowel inflammation or obstruction. No mesenteric hematoma. Lymphatic: No enlarged lymph nodes in the abdomen or pelvis. Reproductive: Uterus and bilateral adnexa are unremarkable. Other: No free air or free fluid. No confluent body wall contusion.  Musculoskeletal: Minimally displaced left superior pubic ramus fracture extending to the pubic body. Nondisplaced left inferior pubic ramus fracture. Left hip joint remains intact. Lumbar spine better assessed on dedicated spine reformats. There are enthesopathic changes of the ischial tuberosities. Review of the MIP images confirms the above findings. IMPRESSION: 1. No aortic dissection or acute aortic injury. Diffuse aortic tortuosity and atherosclerosis. 2. Minimally displaced left superior and nondisplaced inferior pubic ramus fractures. 3. No additional acute traumatic injury to the chest, abdomen, or pelvis, allowing for limitations related to arterial phase imaging. 4. Colonic diverticulosis. 5. Thoracic and lumbar spine assessed on separately reported spine reformats. Aortic Atherosclerosis (ICD10-I70.0). Electronically Signed   By: Keith Rake M.D.   On: 03/31/2019 20:07   Dg Hip Unilat W Or Wo Pelvis 2-3 Views Left  Result Date: 03/31/2019 CLINICAL DATA:  Left hip pain after fall. EXAM: DG HIP (WITH OR WITHOUT PELVIS) 2-3V LEFT COMPARISON:  None. FINDINGS: Mild diffuse osteopenia. Degenerative  changes identified within the lumbar spine and both hips. No acute fracture or dislocation. IMPRESSION: 1. No acute findings. 2. Osteopenia and degenerative changes. Electronically Signed   By: Signa Kell M.D.   On: 03/31/2019 17:47    Procedures Procedures (including critical care time)  CRITICAL CARE Performed by: Richardean Canal   Total critical care time: 30 minutes  Critical care time was exclusive of separately billable procedures and treating other patients.  Critical care was necessary to treat or prevent imminent or life-threatening deterioration.  Critical care was time spent personally by me on the following activities: development of treatment plan with patient and/or surrogate as well as nursing, discussions with consultants, evaluation of patient's response to treatment,  examination of patient, obtaining history from patient or surrogate, ordering and performing treatments and interventions, ordering and review of laboratory studies, ordering and review of radiographic studies, pulse oximetry and re-evaluation of patient's condition.   Medications Ordered in ED Medications  labetalol (NORMODYNE) injection 10 mg (has no administration in time range)  morphine 4 MG/ML injection 4 mg (4 mg Intravenous Given 03/31/19 1801)  labetalol (NORMODYNE) injection 10 mg (10 mg Intravenous Given 03/31/19 1911)  iohexol (OMNIPAQUE) 350 MG/ML injection 100 mL (100 mLs Intravenous Contrast Given 03/31/19 1919)  sodium chloride (PF) 0.9 % injection (  Given by Other 03/31/19 1919)  hydrALAZINE (APRESOLINE) injection 10 mg (10 mg Intravenous Given 03/31/19 2205)     Initial Impression / Assessment and Plan / ED Course  I have reviewed the triage vital signs and the nursing notes.  Pertinent labs & imaging results that were available during my care of the patient were reviewed by me and considered in my medical decision making (see chart for details).       HANNAN TETZLAFF is a 85 y.o. female here with fall. Patient hypertensive. Will get xrays, CT head. Will give pain meds.   10 pm Initial xrays showed no fractures. Patient had persistent pain and unable to ambulate. Also persistently hypertensive. CTA chest/ab/pel done and showed no dissection. There are pelvic fractures however. Trop mildly elevated as well. Talked to Dr. Luiz Blare from ortho, who recommend physical therapy and he will see patient. Given hydralazine, labetalol for hypertensive urgency. Hospitalist to admit.   Final Clinical Impressions(s) / ED Diagnoses   Final diagnoses:  Back pain    ED Discharge Orders    None       Charlynne Pander, MD 03/31/19 [id number]

## 2019-03-31 NOTE — ED Notes (Signed)
Per patient request, patient's daughter Nunzio Cory provided with update.

## 2019-03-31 NOTE — H&P (Signed)
History and Physical    [name] Love ZOX:[id number] DOB: 12-Dec-1922 DOA: 03/31/2019  PCP: Merlene Laughter, MD   Patient coming from: [facility]   Chief Complaint: Fall with left hip pain   HPI: [name] Love is a 85 y.o. female with medical history significant for hypertension, orthostatic hypotension, hypothyroidism, chronic kidney disease stage III, now presenting to the emergency department with left hip pain after a fall.  Patient reports that she had gone out to the mailbox, lost her balance on her way back, fell onto her left hip, and has been experiencing severe pain.  She reports that she was in her usual state of health leading up to this, denies any chest pain, denies any shortness of breath, and denies any recent cough, fever, or chills.  She also denies headache or flank pain.  ED Course: Upon arrival to the ED, patient is found to be afebrile, saturating well initially and later dropping into the mid 80s on room air, and severely hypertensive.  EKG features sinus rhythm with anterior ST elevation that appears similar to prior.  Chest x-ray is negative for acute cardiopulmonary disease.  Radiographs of the left hip and left knee are negative for acute fracture.  On CT, left superior and inferior pubic rami fractures are noted.  There is an age-indeterminate T11 compression fracture.  Chemistry panel is notable for creatinine 1.05, similar to priors.  CBC features a mild leukocytosis and thrombocytosis.  High-sensitivity troponin is slightly elevated and decreasing when repeated.  Patient was treated with morphine, labetalol, and hydralazine in the emergency department.  Orthopedic surgery was consulted by ED physician.  COVID-19 screening test has not yet resulted.  Review of Systems:  All other systems reviewed and apart from HPI, are negative.  Past Medical History:  Diagnosis Date   Atrial fibrillation [facility])    Carotid artery occlusion    Constipation 02/04/2008    Diverticulosis of colon    Dizziness    Esophageal reflux    Hyperlipidemia    Hypertension    Thyroid disease    Urticaria    Varicose veins     Past Surgical History:  Procedure Laterality Date   APPENDECTOMY     CAROTID ENDARTERECTOMY Left 1990     reports that she has never smoked. She has never used smokeless tobacco. She reports current alcohol use. She reports that she does not use drugs.  No Known Allergies  Family History  Problem Relation Age of Onset   Heart attack Father    Heart disease Father        After age 26   Heart attack Brother    Heart attack Brother    Breast cancer Mother    Hypertension Mother    Varicose Veins Mother    Cancer Sister        ? Breast     Prior to Admission medications   Medication Sig Start Date End Date Taking? Authorizing Provider  Calcium Carbonate-Vitamin D (OSCAL 500/200 D-3 PO) Take 1 tablet by mouth daily.   Yes [provider]  Saccharomyces boulardii (FLORASTOR PO) Take 1 tablet by mouth daily.   Yes [provider]  acetaminophen (TYLENOL) 500 MG tablet Take 500 mg by mouth every 6 (six) hours as needed for mild pain, moderate pain, fever or headache.    [provider]  aspirin 81 MG tablet Take 81 mg by mouth daily.    [provider]  levothyroxine (SYNTHROID, LEVOTHROID) 125 MCG  tablet Take 125 mcg by mouth daily. 12/15/16   [provider]  meloxicam (MOBIC) 7.5 MG tablet Take 7.5 mg by mouth daily. 03/20/17   [provider]  Multiple Vitamin (MULTIVITAMIN WITH MINERALS) TABS tablet Take 1 tablet by mouth daily.    [provider]  simvastatin (ZOCOR) 20 MG tablet Take 20 mg by mouth daily.    [provider]    Physical Exam: Vitals:   03/31/19 2115 03/31/19 2203 03/31/19 2230 03/31/19 2245  BP: (!) 196/90 (!) 172/86 (!) 150/63 130/68  Pulse: 82 87 82 81  Resp: 20 15 16 19   Temp:      TempSrc:      SpO2: 95% 96% 95%  95%    Constitutional: NAD, calm  Eyes: PERTLA, lids and conjunctivae normal ENMT: Mucous membranes are dry. Posterior pharynx clear of any exudate or lesions.   Neck: normal, supple, no masses, no thyromegaly Respiratory:  no wheezing, no crackles. Normal respiratory effort. No accessory muscle use.  Cardiovascular: S1 & S2 heard, regular rate and rhythm. No extremity edema.  Abdomen: No distension, no tenderness, soft. Bowel sounds normal.  Musculoskeletal: no clubbing / cyanosis. No joint deformity upper and lower extremities.  Skin: no significant rashes, lesions, ulcers. Warm, dry, well-perfused. Neurologic: no facial asymmetry. Sensation intact. Moving all extremities.  Psychiatric: Alert and oriented to person, place, and situation. Very pleasant, cooperative.     Labs on Admission: I have personally reviewed following labs and imaging studies  CBC: Recent Labs  Lab 03/31/19 1759  WBC 12.9*  NEUTROABS 10.9*  HGB 12.2  HCT 39.0  MCV 94.7  PLT 424*   Basic Metabolic Panel: Recent Labs  Lab 03/31/19 1759  NA 140  K 3.8  CL 100  CO2 26  GLUCOSE 111*  BUN 25*  CREATININE 1.05*  CALCIUM 9.8   GFR: CrCl cannot be calculated (Unknown ideal weight.). Liver Function Tests: No results for input(s): AST, ALT, ALKPHOS, BILITOT, PROT, ALBUMIN in the last 168 hours. No results for input(s): LIPASE, AMYLASE in the last 168 hours. No results for input(s): AMMONIA in the last 168 hours. Coagulation Profile: No results for input(s): INR, PROTIME in the last 168 hours. Cardiac Enzymes: No results for input(s): CKTOTAL, CKMB, CKMBINDEX, TROPONINI in the last 168 hours. BNP (last 3 results) No results for input(s): PROBNP in the last 8760 hours. HbA1C: No results for input(s): HGBA1C in the last 72 hours. CBG: No results for input(s): GLUCAP in the last 168 hours. Lipid Profile: No results for input(s): CHOL, HDL, LDLCALC, TRIG, CHOLHDL, LDLDIRECT in the last 72  hours. Thyroid Function Tests: No results for input(s): TSH, T4TOTAL, FREET4, T3FREE, THYROIDAB in the last 72 hours. Anemia Panel: No results for input(s): VITAMINB12, FOLATE, FERRITIN, TIBC, IRON, RETICCTPCT in the last 72 hours. Urine analysis:    Component Value Date/Time   COLORURINE STRAW (A) 03/19/2018 1355   APPEARANCEUR CLEAR 03/19/2018 1355   LABSPEC 1.003 (L) 03/19/2018 1355   PHURINE 7.0 03/19/2018 1355   GLUCOSEU NEGATIVE 03/19/2018 1355   HGBUR NEGATIVE 03/19/2018 1355   BILIRUBINUR NEGATIVE 03/19/2018 1355   KETONESUR NEGATIVE 03/19/2018 1355   PROTEINUR NEGATIVE 03/19/2018 1355   NITRITE NEGATIVE 03/19/2018 1355   LEUKOCYTESUR TRACE (A) 03/19/2018 1355   Sepsis Labs: @LABRCNTIP (procalcitonin:4,lacticidven:4) )No results found for this or any previous visit (from the past 240 hour(s)).   Radiological Exams on Admission: Dg Chest 1 View  Result Date: 03/31/2019 CLINICAL DATA:  Fall EXAM: CHEST  1 VIEW COMPARISON:  03/17/2017 FINDINGS: Normal heart size. No pericardial effusion. Diffuse coarsened interstitial markings identified bilaterally. No superimposed pleural effusion, pulmonary edema or airspace consolidation. IMPRESSION: No active disease. Electronically Signed   By: Signa Kell M.D.   On: 03/31/2019 17:45   Dg Lumbar Spine Complete  Result Date: 03/31/2019 CLINICAL DATA:  Per EMS, patient from [facility] assisted living, c/o left hip pain after trip and fall. Pt c/o left knee pain. Best images obtained. EXAM: LUMBAR SPINE - COMPLETE 4+ VIEW COMPARISON:  08/02/2008 FINDINGS: There is marked degenerative change throughout the lumbar spine. Significant disc height loss and osteophytes throughout all levels. Mild scoliosis, mild mid lumbar scoliosis is stable. There has been wedge compression fracture of T11 since the prior study. This is only seen on the anterior and oblique images. The age of this fracture is not determined. There is atherosclerotic  calcification of the abdominal aorta. Visualized bowel gas pattern is nonobstructive. IMPRESSION: 1. T11 compression fracture, new since the prior study and of indeterminate age. Given the history of fall, consider thoracic spine series. 2. Marked degenerative changes throughout the lumbar spine. Electronically Signed   By: Norva Pavlov M.D.   On: 03/31/2019 17:47   Ct Head Wo Contrast  Result Date: 03/31/2019 CLINICAL DATA:  Per EMS, patient from [facility] assisted living, c/o left hip pain after trip and fall. Denies LOC. No deformity noted. Denies taking blood thinners. Answers questions appropriately EXAM: CT HEAD WITHOUT CONTRAST CT CERVICAL SPINE WITHOUT CONTRAST TECHNIQUE: Multidetector CT imaging of the head and cervical spine was performed following the standard protocol without intravenous contrast. Multiplanar CT image reconstructions of the cervical spine were also generated. COMPARISON:  03/31/2017 FINDINGS: CT HEAD FINDINGS Brain: There is central and cortical atrophy. Periventricular white matter changes are consistent with small vessel disease. There is no intra or extra-axial fluid collection or mass lesion. The basilar cisterns and ventricles have a normal appearance. There is no CT evidence for acute infarction or hemorrhage. Vascular: There is atherosclerotic calcification of the internal carotid arteries. No hyperdense vessels. Skull: Normal. Negative for fracture or focal lesion. Sinuses/Orbits: Paranasal sinus mucosal thickening. No air-fluid levels or evidence for sinus wall fractures. Other: LEFT parietal scalp edema/hematoma without underlying fracture. CT CERVICAL SPINE FINDINGS Alignment: Normal. Mild convex RIGHT scoliosis Skull base and vertebrae: No acute fracture. No primary bone lesion or focal pathologic process. Soft tissues and spinal canal: No prevertebral fluid or swelling. No visible canal hematoma. Disc levels: Significant degenerative changes throughout the  cervical spine, most notably at C4-5 and C5-6. Upper chest: Biapical pleuroparenchymal changes at the lung apices. Other: There is dense atherosclerotic calcification of the carotid arteries. IMPRESSION: 1. Atrophy and small vessel disease. 2. No evidence for acute intracranial abnormality. 3. LEFT parietal scalp edema/hematoma without underlying fracture. 4. Significant degenerative changes in the cervical spine. 5. No evidence for acute cervical spine abnormality. 6. Dense atherosclerotic calcification of the common and internal carotid arteries. Electronically Signed   By: Norva Pavlov M.D.   On: 03/31/2019 18:06   Ct Cervical Spine Wo Contrast  Result Date: 03/31/2019 CLINICAL DATA:  Per EMS, patient from [facility] assisted living, c/o left hip pain after trip and fall. Denies LOC. No deformity noted. Denies taking blood thinners. Answers questions appropriately EXAM: CT HEAD WITHOUT CONTRAST CT CERVICAL SPINE WITHOUT CONTRAST TECHNIQUE: Multidetector CT imaging of the head and cervical spine was performed following the standard protocol without intravenous contrast. Multiplanar CT image reconstructions of  the cervical spine were also generated. COMPARISON:  03/31/2017 FINDINGS: CT HEAD FINDINGS Brain: There is central and cortical atrophy. Periventricular white matter changes are consistent with small vessel disease. There is no intra or extra-axial fluid collection or mass lesion. The basilar cisterns and ventricles have a normal appearance. There is no CT evidence for acute infarction or hemorrhage. Vascular: There is atherosclerotic calcification of the internal carotid arteries. No hyperdense vessels. Skull: Normal. Negative for fracture or focal lesion. Sinuses/Orbits: Paranasal sinus mucosal thickening. No air-fluid levels or evidence for sinus wall fractures. Other: LEFT parietal scalp edema/hematoma without underlying fracture. CT CERVICAL SPINE FINDINGS Alignment: Normal. Mild convex RIGHT  scoliosis Skull base and vertebrae: No acute fracture. No primary bone lesion or focal pathologic process. Soft tissues and spinal canal: No prevertebral fluid or swelling. No visible canal hematoma. Disc levels: Significant degenerative changes throughout the cervical spine, most notably at C4-5 and C5-6. Upper chest: Biapical pleuroparenchymal changes at the lung apices. Other: There is dense atherosclerotic calcification of the carotid arteries. IMPRESSION: 1. Atrophy and small vessel disease. 2. No evidence for acute intracranial abnormality. 3. LEFT parietal scalp edema/hematoma without underlying fracture. 4. Significant degenerative changes in the cervical spine. 5. No evidence for acute cervical spine abnormality. 6. Dense atherosclerotic calcification of the common and internal carotid arteries. Electronically Signed   By: Nolon Nations M.D.   On: 03/31/2019 18:06   Ct T-spine No Charge  Result Date: 03/31/2019 CLINICAL DATA:  Blood fall, back pain, hypertension EXAM: CT LUMBAR SPINE WITHOUT CONTRAST TECHNIQUE: Multidetector CT imaging of the lumbar spine was performed without intravenous contrast administration. Multiplanar CT image reconstructions were also generated. COMPARISON:  None. FINDINGS: Thoracic spine: Alignment: Normal Vertebrae: There is a superior compression deformity of the T11 vertebral body with approximately 75% loss in vertebral body height. There is buckling of the anterior superior and posterosuperior cortex is. No retropulsion of fragments is seen. Paraspinal and other soft tissues: The paraspinal soft tissues and visualized retroperitoneal structures are unremarkable. The sacroiliac joints are intact. Biapical scarring/atelectasis is seen. Disc levels: Mild disc height loss with facet arthrosis is seen throughout the thoracic spine. At T10-T11 there is buckling of the superior cortex of T11 which causes mild canal narrowing. There is also mild neural foraminal narrowing.  Lumbar spine: Segmentation: There are 5 non-rib bearing lumbar type vertebral bodies with the last intervertebral disc space labeled as L5-S1. Alignment: There is a mild S-shaped scoliotic curvature of the lumbar spine. Slight lateral listhesis of L2 on L3 and L3 on L4 seen. Vertebrae: The vertebral body heights are well maintained. No fracture, malalignment, or pathologic osseous lesions seen. Paraspinal and other soft tissues: The paraspinal soft tissues and visualized retroperitoneal structures are unremarkable. The sacroiliac joints are intact. Scattered dense aortic atherosclerosis is noted. Disc levels: There is disc height loss with facet arthrosis and vacuum phenomenon seen throughout the lumbar spine. This is most notable at L2-L3 and L3-L4 with severe left neural foraminal narrowing and moderate central canal stenosis. IMPRESSION: 1. Age indeterminate superior compression deformity of the T11 vertebral body with 75% loss in vertebral body height. This is new since prior exam of 2018. There is buckling of the superior cortex. However no retropulsion of fragments. 2. Mild S-shaped scoliotic curvature of the lumbar spine. 3. Lumbar spine spondylosis most notable at L2-L3 and L3-L4 with severe left neural foraminal narrowing and moderate central canal stenosis. Electronically Signed   By: Prudencio Pair M.D.   On: 03/31/2019 20:06  Ct L-spine No Charge  Result Date: 03/31/2019 CLINICAL DATA:  Blood fall, back pain, hypertension EXAM: CT LUMBAR SPINE WITHOUT CONTRAST TECHNIQUE: Multidetector CT imaging of the lumbar spine was performed without intravenous contrast administration. Multiplanar CT image reconstructions were also generated. COMPARISON:  None. FINDINGS: Thoracic spine: Alignment: Normal Vertebrae: There is a superior compression deformity of the T11 vertebral body with approximately 75% loss in vertebral body height. There is buckling of the anterior superior and posterosuperior cortex is. No  retropulsion of fragments is seen. Paraspinal and other soft tissues: The paraspinal soft tissues and visualized retroperitoneal structures are unremarkable. The sacroiliac joints are intact. Biapical scarring/atelectasis is seen. Disc levels: Mild disc height loss with facet arthrosis is seen throughout the thoracic spine. At T10-T11 there is buckling of the superior cortex of T11 which causes mild canal narrowing. There is also mild neural foraminal narrowing. Lumbar spine: Segmentation: There are 5 non-rib bearing lumbar type vertebral bodies with the last intervertebral disc space labeled as L5-S1. Alignment: There is a mild S-shaped scoliotic curvature of the lumbar spine. Slight lateral listhesis of L2 on L3 and L3 on L4 seen. Vertebrae: The vertebral body heights are well maintained. No fracture, malalignment, or pathologic osseous lesions seen. Paraspinal and other soft tissues: The paraspinal soft tissues and visualized retroperitoneal structures are unremarkable. The sacroiliac joints are intact. Scattered dense aortic atherosclerosis is noted. Disc levels: There is disc height loss with facet arthrosis and vacuum phenomenon seen throughout the lumbar spine. This is most notable at L2-L3 and L3-L4 with severe left neural foraminal narrowing and moderate central canal stenosis. IMPRESSION: 1. Age indeterminate superior compression deformity of the T11 vertebral body with 75% loss in vertebral body height. This is new since prior exam of 2018. There is buckling of the superior cortex. However no retropulsion of fragments. 2. Mild S-shaped scoliotic curvature of the lumbar spine. 3. Lumbar spine spondylosis most notable at L2-L3 and L3-L4 with severe left neural foraminal narrowing and moderate central canal stenosis. Electronically Signed   By: Jonna Clark M.D.   On: 03/31/2019 20:06   Dg Knee Complete 4 Views Left  Result Date: 03/31/2019 CLINICAL DATA:  Fall EXAM: LEFT KNEE - COMPLETE 4+ VIEW  COMPARISON:  None FINDINGS: Osseous demineralization. Extensive chondrocalcinosis. Minimal joint space narrowing. No acute fracture, dislocation, or bone destruction. Scattered atherosclerotic calcifications. No knee joint effusion. IMPRESSION: Osseous demineralization with minimal degenerative changes and scattered chondrocalcinosis question CPPD. No acute osseous abnormalities. Electronically Signed   By: Ulyses Southward M.D.   On: 03/31/2019 17:47   Ct Angio Chest/abd/pel For Dissection W And/or Wo Contrast  Result Date: 03/31/2019 CLINICAL DATA:  85 year old with back pain after trip and fall. Minor trauma. Hypertension. Rule out dissection. EXAM: CT ANGIOGRAPHY CHEST, ABDOMEN AND PELVIS TECHNIQUE: Multidetector CT imaging through the chest, abdomen and pelvis was performed using the standard protocol during bolus administration of intravenous contrast. Multiplanar reconstructed images and MIPs were obtained and reviewed to evaluate the vascular anatomy. CONTRAST:  OMNIPAQUE IOHEXOL 350 MG/ML SOLN COMPARISON:  Chest radiograph earlier this day. FINDINGS: CTA CHEST FINDINGS Cardiovascular: Noncontrast CT demonstrates no aortic hematoma. No aneurysm. Moderate atherosclerosis of the thoracic aorta and branch vessels. Post contrast there is no evidence of acute aortic injury, dissection, vasculitis or acute aortic syndrome. No filling defects in the central most pulmonary arteries to the lobar level. There are coronary artery calcifications. Borderline cardiomegaly. No pericardial effusion. Mediastinum/Nodes: Calcified mediastinal lymph nodes consistent with prior granulomatous disease. No  noncalcified adenopathy. No aortic hematoma or hemorrhage. No pneumomediastinum. No esophageal wall thickening. No thyroid nodule. Lungs/Pleura: No pneumothorax. Biapical pleuroparenchymal scarring. Mild dependent atelectasis. Small intraparenchymal cyst in the left lower lobe specific clinical significance. No pulmonary  edema or pleural effusion. Calcified granuloma in the right lower lobe. Trachea and mainstem bronchi are patent, mild motion artifact limitations. Musculoskeletal: Thoracic spine better assessed on dedicated thoracic spine reformats. T11 compression fracture. No acute fracture of the sternum, ribs, included clavicles or shoulder girdles. Review of the MIP images confirms the above findings. CTA ABDOMEN AND PELVIS FINDINGS VASCULAR Aorta: Atherosclerotic and tortuous. No dissection or evidence of acute injury. No frank aneurysmal dilatation. Celiac: No dissection or acute injury. Calcified plaque at the origin causes approximately 50% luminal stenosis. SMA: No dissection or acute injury. Calcified plaque at the origin causes approximately 50% luminal narrowing. Branch vessels are patent. Renals: Plaque at the origin of both renal arteries. Approximately 50% luminal narrowing the the origin of both renal arteries, left greater than right. IMA: Patent without evidence of aneurysm, dissection, vasculitis or significant stenosis. Inflow: Atherosclerotic and tortuous. No dissection or acute abnormality. Veins: No obvious venous abnormality within the limitations of this arterial phase study. Review of the MIP images confirms the above findings. NON-VASCULAR Hepatobiliary: Arterial phase imaging limits evaluation for acute injury. No evidence of focal hepatic abnormality. No perihepatic hematoma. Gallbladder physiologically distended, no calcified stone. No biliary dilatation. Pancreas: No ductal dilatation or inflammation. Spleen: Normal in size and arterial phase enhancement. Mild motion artifact limits detailed assessment. No perisplenic hematoma. Adrenals/Urinary Tract: No renal or adrenal hemorrhage. No hydronephrosis. No perinephric edema. Distended urinary bladder without wall thickening. Stomach/Bowel: Colonic diverticulosis, advanced in the descending and sigmoid colon. Motion artifact limits detailed bowel  assessment. No evidence of bowel inflammation or obstruction. No mesenteric hematoma. Lymphatic: No enlarged lymph nodes in the abdomen or pelvis. Reproductive: Uterus and bilateral adnexa are unremarkable. Other: No free air or free fluid. No confluent body wall contusion. Musculoskeletal: Minimally displaced left superior pubic ramus fracture extending to the pubic body. Nondisplaced left inferior pubic ramus fracture. Left hip joint remains intact. Lumbar spine better assessed on dedicated spine reformats. There are enthesopathic changes of the ischial tuberosities. Review of the MIP images confirms the above findings. IMPRESSION: 1. No aortic dissection or acute aortic injury. Diffuse aortic tortuosity and atherosclerosis. 2. Minimally displaced left superior and nondisplaced inferior pubic ramus fractures. 3. No additional acute traumatic injury to the chest, abdomen, or pelvis, allowing for limitations related to arterial phase imaging. 4. Colonic diverticulosis. 5. Thoracic and lumbar spine assessed on separately reported spine reformats. Aortic Atherosclerosis (ICD10-I70.0). Electronically Signed   By: Narda RutherfordMelanie  Sanford M.D.   On: 03/31/2019 20:07   Dg Hip Unilat W Or Wo Pelvis 2-3 Views Left  Result Date: 03/31/2019 CLINICAL DATA:  Left hip pain after fall. EXAM: DG HIP (WITH OR WITHOUT PELVIS) 2-3V LEFT COMPARISON:  None. FINDINGS: Mild diffuse osteopenia. Degenerative changes identified within the lumbar spine and both hips. No acute fracture or dislocation. IMPRESSION: 1. No acute findings. 2. Osteopenia and degenerative changes. Electronically Signed   By: Signa Kellaylor  Stroud M.D.   On: 03/31/2019 17:47    EKG: Independently reviewed. Sinus rhythm, anterior ST abnormality is similar to prior.   Assessment/Plan   1. Pubic rami fractures  - Presents after a fall, reports left hip pain, and is found to have left inferior and superior pubic rami fractures  - She does not feel as though  she can bear  weight  - Orthopedic surgery consulting and much appreciated, no indication for surgery   - Continue pain-control, supportive care, PT eval and tx    2. Hypoxia  - Oxygen saturations fell to mid 80's in ED  - No acute pulmonary process on CTA, likely secondary to narcotic analgesia given  - She denies pain at this time, will continue pain-control with lower doses, wean supplemental O2 as tolerated   3. Hypertensive urgency  - BP >200/100 in ED  - Patient denies CP or HA, renal fxn at baseline, and no pulm edema on imaging  - Hold Florinef, continue pain-control, labetalol IVP's as needed  4. Orthostatic hypotension  - Hold Florinef in light of severe HTN  - Patient's daughter reports that she frequently has orthostatic syncope, recovers quickly, has improved with Florinef  - Hold Florinef in light of severe HTN, up with assistance only    5. CKD III  - SCr is 1.05 on admission, similar to priors  - Renally-dose medications, monitor    6. Hypothyroidism  - Continue Synthroid    7. Elevated troponin  - Troponin slightly elevated in ED  - Patient denies chest pain or SOB, EKG does not appear significantly different from priors, and troponin is decreasing  - Continue statin, BP-control    PPE: mask, face shield  DVT prophylaxis: SCD's  Code Status: Full code per daughter  Family Communication: Daughter updated by phone   Consults called: Orthopedic surgery consulted by ED physician  Admission status: Observation     Briscoe Deutscher, MD [facility] Pager [phone]  If 7PM-7AM, please contact night-coverage [email or web address] Password [email or web address]  03/31/2019, 11:03 PM

## 2019-03-31 NOTE — ED Notes (Addendum)
Patient SpO2 dropped to 85% with great waveform. Changed SpO2 sensor to different finger with no improvement. Encouraged deep breathing with no improvement. Placed patient on 2 L of O2 via [place] with an improvement to 96%. Patient not complaining of SOB and in no acute distress at this time. EDP made aware.

## 2019-03-31 NOTE — ED Triage Notes (Signed)
Per EMS, patient from [facility] assisted living, c/o left hip pain after trip and fall. Denies LOC. No deformity noted. Denies taking blood thinners. Answers questions appropriately. Refused pain meds with EMS.

## 2019-03-31 NOTE — ED Notes (Signed)
Pt undressed and placed on a pure-wick.

## 2019-03-31 NOTE — ED Notes (Addendum)
Patient refusing to attempt to ambulate. MD made aware.

## 2019-04-01 DIAGNOSIS — Z803 Family history of malignant neoplasm of breast: Secondary | ICD-10-CM | POA: Diagnosis not present

## 2019-04-01 DIAGNOSIS — W010XXA Fall on same level from slipping, tripping and stumbling without subsequent striking against object, initial encounter: Secondary | ICD-10-CM | POA: Diagnosis present

## 2019-04-01 DIAGNOSIS — N183 Chronic kidney disease, stage 3 unspecified: Secondary | ICD-10-CM | POA: Diagnosis present

## 2019-04-01 DIAGNOSIS — D72829 Elevated white blood cell count, unspecified: Secondary | ICD-10-CM | POA: Diagnosis present

## 2019-04-01 DIAGNOSIS — E785 Hyperlipidemia, unspecified: Secondary | ICD-10-CM | POA: Diagnosis present

## 2019-04-01 DIAGNOSIS — I129 Hypertensive chronic kidney disease with stage 1 through stage 4 chronic kidney disease, or unspecified chronic kidney disease: Secondary | ICD-10-CM | POA: Diagnosis present

## 2019-04-01 DIAGNOSIS — Z20828 Contact with and (suspected) exposure to other viral communicable diseases: Secondary | ICD-10-CM | POA: Diagnosis present

## 2019-04-01 DIAGNOSIS — I951 Orthostatic hypotension: Secondary | ICD-10-CM | POA: Diagnosis present

## 2019-04-01 DIAGNOSIS — I160 Hypertensive urgency: Secondary | ICD-10-CM | POA: Diagnosis present

## 2019-04-01 DIAGNOSIS — E039 Hypothyroidism, unspecified: Secondary | ICD-10-CM | POA: Diagnosis present

## 2019-04-01 DIAGNOSIS — Z8249 Family history of ischemic heart disease and other diseases of the circulatory system: Secondary | ICD-10-CM | POA: Diagnosis not present

## 2019-04-01 DIAGNOSIS — S329XXA Fracture of unspecified parts of lumbosacral spine and pelvis, initial encounter for closed fracture: Secondary | ICD-10-CM | POA: Diagnosis not present

## 2019-04-01 DIAGNOSIS — K219 Gastro-esophageal reflux disease without esophagitis: Secondary | ICD-10-CM | POA: Diagnosis present

## 2019-04-01 DIAGNOSIS — Z66 Do not resuscitate: Secondary | ICD-10-CM | POA: Diagnosis present

## 2019-04-01 DIAGNOSIS — Z79899 Other long term (current) drug therapy: Secondary | ICD-10-CM | POA: Diagnosis not present

## 2019-04-01 DIAGNOSIS — Z7982 Long term (current) use of aspirin: Secondary | ICD-10-CM | POA: Diagnosis not present

## 2019-04-01 DIAGNOSIS — I4891 Unspecified atrial fibrillation: Secondary | ICD-10-CM | POA: Diagnosis present

## 2019-04-01 DIAGNOSIS — R778 Other specified abnormalities of plasma proteins: Secondary | ICD-10-CM | POA: Diagnosis not present

## 2019-04-01 DIAGNOSIS — J9601 Acute respiratory failure with hypoxia: Secondary | ICD-10-CM | POA: Diagnosis present

## 2019-04-01 DIAGNOSIS — S32592A Other specified fracture of left pubis, initial encounter for closed fracture: Secondary | ICD-10-CM | POA: Diagnosis present

## 2019-04-01 DIAGNOSIS — Z79890 Hormone replacement therapy (postmenopausal): Secondary | ICD-10-CM | POA: Diagnosis not present

## 2019-04-01 LAB — CBC WITH DIFFERENTIAL/PLATELET
Abs Immature Granulocytes: 0.08 10*3/uL — ABNORMAL HIGH (ref 0.00–0.07)
Basophils Absolute: 0.1 10*3/uL (ref 0.0–0.1)
Basophils Relative: 0 %
Eosinophils Absolute: 0.1 10*3/uL (ref 0.0–0.5)
Eosinophils Relative: 1 %
HCT: 34.4 % — ABNORMAL LOW (ref 36.0–46.0)
Hemoglobin: 10.9 g/dL — ABNORMAL LOW (ref 12.0–15.0)
Immature Granulocytes: 1 %
Lymphocytes Relative: 5 %
Lymphs Abs: 0.6 10*3/uL — ABNORMAL LOW (ref 0.7–4.0)
MCH: 29.9 pg (ref 26.0–34.0)
MCHC: 31.7 g/dL (ref 30.0–36.0)
MCV: 94.5 fL (ref 80.0–100.0)
Monocytes Absolute: 0.8 10*3/uL (ref 0.1–1.0)
Monocytes Relative: 6 %
Neutro Abs: 11.9 10*3/uL — ABNORMAL HIGH (ref 1.7–7.7)
Neutrophils Relative %: 87 %
Platelets: 371 10*3/uL (ref 150–400)
RBC: 3.64 MIL/uL — ABNORMAL LOW (ref 3.87–5.11)
RDW: 13.7 % (ref 11.5–15.5)
WBC: 13.5 10*3/uL — ABNORMAL HIGH (ref 4.0–10.5)
nRBC: 0 % (ref 0.0–0.2)

## 2019-04-01 LAB — BASIC METABOLIC PANEL
Anion gap: 10 (ref 5–15)
BUN: 22 mg/dL (ref 8–23)
CO2: 29 mmol/L (ref 22–32)
Calcium: 9.5 mg/dL (ref 8.9–10.3)
Chloride: 101 mmol/L (ref 98–111)
Creatinine, Ser: 1.09 mg/dL — ABNORMAL HIGH (ref 0.44–1.00)
GFR calc Af Amer: 51 mL/min — ABNORMAL LOW (ref >60)
GFR calc non Af Amer: 44 mL/min — ABNORMAL LOW (ref >60)
Glucose, Bld: 116 mg/dL — ABNORMAL HIGH (ref 70–99)
Potassium: 4 mmol/L (ref 3.5–5.1)
Sodium: 140 mmol/L (ref 135–145)

## 2019-04-01 LAB — URINALYSIS, ROUTINE W REFLEX MICROSCOPIC
Bacteria, UA: NONE SEEN
Bilirubin Urine: NEGATIVE
Glucose, UA: NEGATIVE mg/dL
Hgb urine dipstick: NEGATIVE
Ketones, ur: NEGATIVE mg/dL
Leukocytes,Ua: NEGATIVE
Nitrite: NEGATIVE
Protein, ur: 30 mg/dL — AB
Specific Gravity, Urine: 1.023 (ref 1.005–1.030)
pH: 8 (ref 5.0–8.0)

## 2019-04-01 LAB — SARS CORONAVIRUS 2 (TAT 6-24 HRS): SARS Coronavirus 2: NEGATIVE

## 2019-04-01 MED ORDER — AMLODIPINE BESYLATE 5 MG PO TABS
5.0000 mg | ORAL_TABLET | Freq: Every day | ORAL | Status: DC
  Administered 2019-04-01: 5 mg via ORAL
  Filled 2019-04-01: qty 1

## 2019-04-01 MED ORDER — AMLODIPINE BESYLATE 5 MG PO TABS
2.5000 mg | ORAL_TABLET | Freq: Every day | ORAL | Status: DC
  Filled 2019-04-01: qty 1

## 2019-04-01 MED ORDER — METHOCARBAMOL 500 MG PO TABS: 500.0000 mg | ORAL_TABLET | Freq: Three times a day (TID) | ORAL | Status: DC | PRN

## 2019-04-01 MED ORDER — FLUDROCORTISONE ACETATE 0.1 MG PO TABS
0.1000 mg | ORAL_TABLET | Freq: Two times a day (BID) | ORAL | Status: DC
  Administered 2019-04-01: 0.1 mg via ORAL
  Filled 2019-04-01 (×2): qty 1

## 2019-04-01 MED ORDER — MORPHINE SULFATE (PF) 2 MG/ML IV SOLN: 1.0000 mg | INTRAVENOUS | Status: DC | PRN

## 2019-04-01 MED ORDER — NALOXONE HCL 0.4 MG/ML IJ SOLN
0.2000 mg | Freq: Once | INTRAMUSCULAR | Status: AC
  Administered 2019-04-01: 0.2 mg via INTRAVENOUS
  Filled 2019-04-01: qty 1

## 2019-04-01 MED ORDER — MORPHINE SULFATE (PF) 2 MG/ML IV SOLN: 0.5000 mg | INTRAVENOUS | Status: DC | PRN

## 2019-04-01 MED ORDER — KETOROLAC TROMETHAMINE 15 MG/ML IJ SOLN: 15.0000 mg | Freq: Four times a day (QID) | INTRAMUSCULAR | Status: DC | PRN

## 2019-04-01 MED ORDER — HYDROCODONE-ACETAMINOPHEN 5-325 MG PO TABS: 1.0000 | ORAL_TABLET | Freq: Three times a day (TID) | ORAL | Status: DC | PRN

## 2019-04-01 MED ORDER — FLUDROCORTISONE ACETATE 0.1 MG PO TABS
0.1000 mg | ORAL_TABLET | Freq: Every day | ORAL | Status: DC
  Filled 2019-04-01: qty 1

## 2019-04-01 MED ORDER — ACETAMINOPHEN 500 MG PO TABS
1000.0000 mg | ORAL_TABLET | Freq: Three times a day (TID) | ORAL | Status: DC
  Administered 2019-04-01 – 2019-04-02 (×3): 1000 mg via ORAL
  Filled 2019-04-01 (×3): qty 2

## 2019-04-01 NOTE — Progress Notes (Signed)
Assumed care of pt from off going RN. Condition table. No changes in initial am assessment. Cont with plan of care.  

## 2019-04-01 NOTE — Progress Notes (Addendum)
[facility]                                                                              Patient Demographics  [name] Love, is a 85 y.o. female, DOB - 1922-06-19, WUJ:[id number]  Admit date - 03/31/2019   Admitting Physician Briscoe Deutscher, MD  Outpatient Primary MD for the patient is Merlene Laughter, MD  Outpatient specialists:   LOS - 0  days   Medical records reviewed and are as summarized below:    Chief Complaint  Patient presents with   Hip Pain       Brief summary   Patient is a 85 year old female with history of hypertension, orthostatic hypotension, hypothyroidism, CKD stage III presented to ED with left hip pain after a mechanical fall.  Patient had gone to the mailbox, lost balance on her way back, fell onto her left hip and then experiencing severe pain. In ED, patient was found to be afebrile, O2 sats initially normal but later dropped into mid 80s on room air, severely hypertensive.  EKG showed sinus rhythm with anterior ST elevation appears similar to prior EKGs.  Chest x-ray negative.  On CT, left superior and inferior pubic rami fractures were noted.  Age-indeterminate T11 compression fracture.  Orthopedic surgery was consulted, seen by Dr. Luiz Blare, nonoperative  Patient was admitted for pain control, PT OT and may need skilled nursing facility. COVID 19 negative   Assessment & Plan    Principal Problem:   Pubic ramus fracture, left, closed, initial encounter (HCC) -Status post mechanical fall, CT showed minimally displaced superior pubic ramus fracture and nondisplaced inferior pubic ramusi fracture left side -Orthopedics consulted, seen by Dr. Luiz Blare, recommended no indication for surgical intervention.  Weightbearing as tolerated ambulation with a walker.  Outpatient follow-up in 2 weeks - will place on scheduled Tylenol 1000 mg 3 times daily, low-dose morphine for severe pain, decrease oxycodone, add Robaxin 500 mg every 8 hours as  needed -PT OT evaluation pending, currently at independent living facility Addendum Somewhat lethargic, only had received Norco dose today. - will DC morphine, Norco, Robaxin.  Continue scheduled Tylenol, Toradol IV prn for severe pain. -Narcan 0.02 mg IV x1 given, following which patient became somewhat alert  Active Problems: Mildly elevated troponin -Likely due to hypertensive urgency, no chest pain or shortness of breath.  EKG with minimally elevated ST, will repeat EKG    Hypertensive urgency -BP uncontrolled, likely worse due to pain.  BP > 200 -Patient started on low-dose amlodipine, Florinef was held last night however patient's daughter very concerned that patient gets orthostatic hypotension if not given Florinef (ongoing chronic issue for last 3 years) -Avoid strict BP control due to history of orthostatic hypotension, target BP ~140/90     Acute respiratory failure with hypoxia (HCC) -Unclear etiology, CT angiogram of the chest abdomen pelvis showed no aortic dissection or injury no pulmonary embolism mention.  No pulmonary edema or pleural effusion, pneumonia.  Possible atelectasis due to pain. -Continue incentive spirometry -O2 sats 95% on 2 L, wean O2 as tolerated    Hypothyroidism -Continue Synthroid 112 mcg daily    CKD (  chronic kidney disease), stage III -Creatinine currently at baseline, follow closely    Orthostatic hypotension -Florinef was held in the light of severe hypertension.   - will restart Florinef at half dose to avoid orthostatic hypotension, patient will need to do PT   Code Status: DNR DVT Prophylaxis:  SCD's Family Communication: Discussed all imaging results, lab results, explained to the patient's daughter in detail   Disposition Plan: PT evaluation pending, currently in independent living facility.  Due to acute metabolic encephalopathy, hypoxia, PT evaluation pending, may not be able to discharge safely.  Likely will need higher level of  care for rehab and PT.  Will change to inpatient.  Time Spent in minutes 35 minutes  Procedures:  CT chest abdomen pelvis  Consultants:   Orthopedics  Antimicrobials:   Anti-infectives (From admission, onward)   None          Medications  Scheduled Meds:  acetaminophen  1,000 mg Oral TID   amLODipine  5 mg Oral Daily   levothyroxine  112 mcg Oral Q0600   sodium chloride flush  3 mL Intravenous Q12H   sodium chloride flush  3 mL Intravenous Q12H   Continuous Infusions:  sodium chloride     PRN Meds:.sodium chloride, acetaminophen **OR** acetaminophen, HYDROcodone-acetaminophen, labetalol, morphine injection, ondansetron **OR** ondansetron (ZOFRAN) IV, polyethylene glycol, sodium chloride flush      Subjective:   [name] Love was seen and examined today.  Complaining of pain at the time of examination on moving.  Otherwise no chest pain, dizziness or shortness of breath. Patient denies abdominal pain, N/V/D/C, new weakness, numbess, tingling.   Objective:   Vitals:   03/31/19 2338 03/31/19 2356 04/01/19 0102 04/01/19 0514  BP: (!) 181/86 (!) 181/86 (!) 159/70 (!) 167/63  Pulse: 82 82 77 80  Resp: 16 16  18   Temp: 97.7 F (36.5 C) 97.7 F (36.5 C)  98.6 F (37 C)  TempSrc: Oral Oral  Oral  SpO2: 95%   (!) 85%  Weight:  56.2 kg    Height:  5\' 5"  (1.651 m)      Intake/Output Summary (Last 24 hours) at 04/01/2019 1037 Last data filed at 04/01/2019 0600 Gross per 24 hour  Intake 240 ml  Output 1350 ml  Net -1110 ml     Wt Readings from Last 3 Encounters:  03/31/19 56.2 kg  04/14/17 66.2 kg  03/31/17 68 kg     Exam  General: Alert and oriented x 3, NAD, hearing deficit  Eyes:   HEENT:  Atraumatic, normocephalic  Cardiovascular: S1 S2 auscultated, , RRR  Respiratory: Clear to auscultation bilaterally, no wheezing, rales or rhonchi  Gastrointestinal: Soft, nontender, nondistended, + bowel sounds  Ext: no pedal edema  bilaterally  Neuro: strength 5/5 in upper and lower extremities   Musculoskeletal: No digital cyanosis, clubbing  Skin: No rashes  Psych: Normal affect and demeanor, alert and oriented x3    Data Reviewed:  I have personally reviewed following labs and imaging studies  Micro Results Recent Results (from the past 240 hour(s))  SARS CORONAVIRUS 2 (TAT 6-24 HRS) Nasopharyngeal Nasopharyngeal Swab     Status: None   Collection Time: 03/31/19 10:57 PM   Specimen: Nasopharyngeal Swab  Result Value Ref Range Status   SARS Coronavirus 2 NEGATIVE NEGATIVE Final    Comment: (NOTE) SARS-CoV-2 target nucleic acids are NOT DETECTED. The SARS-CoV-2 RNA is generally detectable in upper and lower respiratory specimens during the acute phase of infection. Negative results do  not preclude SARS-CoV-2 infection, do not rule out co-infections with other pathogens, and should not be used as the sole basis for treatment or other patient management decisions. Negative results must be combined with clinical observations, patient history, and epidemiological information. The expected result is Negative. Fact Sheet for Patients: HairSlick.no Fact Sheet for Healthcare Providers: quierodirigir.com This test is not yet approved or cleared by the [place] FDA and  has been authorized for detection and/or diagnosis of SARS-CoV-2 by FDA under an Emergency Use Authorization (EUA). This EUA will remain  in effect (meaning this test can be used) for the duration of the COVID-19 declaration under Section 56 4(b)(1) of the Act, 21 U.S.C. section 360bbb-3(b)(1), unless the authorization is terminated or revoked sooner. Performed at [facility], [address], [place], [place] [address]     Radiology Reports Dg Chest 1 View  Result Date: 03/31/2019 CLINICAL DATA:  Fall EXAM: CHEST  1 VIEW COMPARISON:  03/17/2017 FINDINGS: Normal heart size.  No pericardial effusion. Diffuse coarsened interstitial markings identified bilaterally. No superimposed pleural effusion, pulmonary edema or airspace consolidation. IMPRESSION: No active disease. Electronically Signed   By: Signa Kell M.D.   On: 03/31/2019 17:45   Dg Lumbar Spine Complete  Result Date: 03/31/2019 CLINICAL DATA:  Per EMS, patient from [facility] assisted living, c/o left hip pain after trip and fall. Pt c/o left knee pain. Best images obtained. EXAM: LUMBAR SPINE - COMPLETE 4+ VIEW COMPARISON:  08/02/2008 FINDINGS: There is marked degenerative change throughout the lumbar spine. Significant disc height loss and osteophytes throughout all levels. Mild scoliosis, mild mid lumbar scoliosis is stable. There has been wedge compression fracture of T11 since the prior study. This is only seen on the anterior and oblique images. The age of this fracture is not determined. There is atherosclerotic calcification of the abdominal aorta. Visualized bowel gas pattern is nonobstructive. IMPRESSION: 1. T11 compression fracture, new since the prior study and of indeterminate age. Given the history of fall, consider thoracic spine series. 2. Marked degenerative changes throughout the lumbar spine. Electronically Signed   By: Norva Pavlov M.D.   On: 03/31/2019 17:47   Ct Head Wo Contrast  Result Date: 03/31/2019 CLINICAL DATA:  Per EMS, patient from [facility] assisted living, c/o left hip pain after trip and fall. Denies LOC. No deformity noted. Denies taking blood thinners. Answers questions appropriately EXAM: CT HEAD WITHOUT CONTRAST CT CERVICAL SPINE WITHOUT CONTRAST TECHNIQUE: Multidetector CT imaging of the head and cervical spine was performed following the standard protocol without intravenous contrast. Multiplanar CT image reconstructions of the cervical spine were also generated. COMPARISON:  03/31/2017 FINDINGS: CT HEAD FINDINGS Brain: There is central and cortical atrophy.  Periventricular white matter changes are consistent with small vessel disease. There is no intra or extra-axial fluid collection or mass lesion. The basilar cisterns and ventricles have a normal appearance. There is no CT evidence for acute infarction or hemorrhage. Vascular: There is atherosclerotic calcification of the internal carotid arteries. No hyperdense vessels. Skull: Normal. Negative for fracture or focal lesion. Sinuses/Orbits: Paranasal sinus mucosal thickening. No air-fluid levels or evidence for sinus wall fractures. Other: LEFT parietal scalp edema/hematoma without underlying fracture. CT CERVICAL SPINE FINDINGS Alignment: Normal. Mild convex RIGHT scoliosis Skull base and vertebrae: No acute fracture. No primary bone lesion or focal pathologic process. Soft tissues and spinal canal: No prevertebral fluid or swelling. No visible canal hematoma. Disc levels: Significant degenerative changes throughout the cervical spine, most notably  at C4-5 and C5-6. Upper chest: Biapical pleuroparenchymal changes at the lung apices. Other: There is dense atherosclerotic calcification of the carotid arteries. IMPRESSION: 1. Atrophy and small vessel disease. 2. No evidence for acute intracranial abnormality. 3. LEFT parietal scalp edema/hematoma without underlying fracture. 4. Significant degenerative changes in the cervical spine. 5. No evidence for acute cervical spine abnormality. 6. Dense atherosclerotic calcification of the common and internal carotid arteries. Electronically Signed   By: Norva PavlovElizabeth  Brown M.D.   On: 03/31/2019 18:06   Ct Cervical Spine Wo Contrast  Result Date: 03/31/2019 CLINICAL DATA:  Per EMS, [facility], c/o left hip pain after trip and fall. Denies LOC. No deformity noted. Denies taking blood thinners. Answers questions appropriately EXAM: CT HEAD WITHOUT CONTRAST CT CERVICAL SPINE WITHOUT CONTRAST TECHNIQUE: Multidetector CT imaging of the head and cervical  spine was performed following the standard protocol without intravenous contrast. Multiplanar CT image reconstructions of the cervical spine were also generated. COMPARISON:  03/31/2017 FINDINGS: CT HEAD FINDINGS Brain: There is central and cortical atrophy. Periventricular white matter changes are consistent with small vessel disease. There is no intra or extra-axial fluid collection or mass lesion. The basilar cisterns and ventricles have a normal appearance. There is no CT evidence for acute infarction or hemorrhage. Vascular: There is atherosclerotic calcification of the internal carotid arteries. No hyperdense vessels. Skull: Normal. Negative for fracture or focal lesion. Sinuses/Orbits: Paranasal sinus mucosal thickening. No air-fluid levels or evidence for sinus wall fractures. Other: LEFT parietal scalp edema/hematoma without underlying fracture. CT CERVICAL SPINE FINDINGS Alignment: Normal. Mild convex RIGHT scoliosis Skull base and vertebrae: No acute fracture. No primary bone lesion or focal pathologic process. Soft tissues and spinal canal: No prevertebral fluid or swelling. No visible canal hematoma. Disc levels: Significant degenerative changes throughout the cervical spine, most notably at C4-5 and C5-6. Upper chest: Biapical pleuroparenchymal changes at the lung apices. Other: There is dense atherosclerotic calcification of the carotid arteries. IMPRESSION: 1. Atrophy and small vessel disease. 2. No evidence for acute intracranial abnormality. 3. LEFT parietal scalp edema/hematoma without underlying fracture. 4. Significant degenerative changes in the cervical spine. 5. No evidence for acute cervical spine abnormality. 6. Dense atherosclerotic calcification of the common and internal carotid arteries. Electronically Signed   By: Norva PavlovElizabeth  Brown M.D.   On: 03/31/2019 18:06   Ct T-spine No Charge  Result Date: 03/31/2019 CLINICAL DATA:  Blood fall, back pain, hypertension EXAM: CT LUMBAR SPINE  WITHOUT CONTRAST TECHNIQUE: Multidetector CT imaging of the lumbar spine was performed without intravenous contrast administration. Multiplanar CT image reconstructions were also generated. COMPARISON:  None. FINDINGS: Thoracic spine: Alignment: Normal Vertebrae: There is a superior compression deformity of the T11 vertebral body with approximately 75% loss in vertebral body height. There is buckling of the anterior superior and posterosuperior cortex is. No retropulsion of fragments is seen. Paraspinal and other soft tissues: The paraspinal soft tissues and visualized retroperitoneal structures are unremarkable. The sacroiliac joints are intact. Biapical scarring/atelectasis is seen. Disc levels: Mild disc height loss with facet arthrosis is seen throughout the thoracic spine. At T10-T11 there is buckling of the superior cortex of T11 which causes mild canal narrowing. There is also mild neural foraminal narrowing. Lumbar spine: Segmentation: There are 5 non-rib bearing lumbar type vertebral bodies with the last intervertebral disc space labeled as L5-S1. Alignment: There is a mild S-shaped scoliotic curvature of the lumbar spine. Slight lateral listhesis of L2 on L3 and L3 on L4 seen.  Vertebrae: The vertebral body heights are well maintained. No fracture, malalignment, or pathologic osseous lesions seen. Paraspinal and other soft tissues: The paraspinal soft tissues and visualized retroperitoneal structures are unremarkable. The sacroiliac joints are intact. Scattered dense aortic atherosclerosis is noted. Disc levels: There is disc height loss with facet arthrosis and vacuum phenomenon seen throughout the lumbar spine. This is most notable at L2-L3 and L3-L4 with severe left neural foraminal narrowing and moderate central canal stenosis. IMPRESSION: 1. Age indeterminate superior compression deformity of the T11 vertebral body with 75% loss in vertebral body height. This is new since prior exam of 2018. There is  buckling of the superior cortex. However no retropulsion of fragments. 2. Mild S-shaped scoliotic curvature of the lumbar spine. 3. Lumbar spine spondylosis most notable at L2-L3 and L3-L4 with severe left neural foraminal narrowing and moderate central canal stenosis. Electronically Signed   By: Jonna Clark M.D.   On: 03/31/2019 20:06   Ct L-spine No Charge  Result Date: 03/31/2019 CLINICAL DATA:  Blood fall, back pain, hypertension EXAM: CT LUMBAR SPINE WITHOUT CONTRAST TECHNIQUE: Multidetector CT imaging of the lumbar spine was performed without intravenous contrast administration. Multiplanar CT image reconstructions were also generated. COMPARISON:  None. FINDINGS: Thoracic spine: Alignment: Normal Vertebrae: There is a superior compression deformity of the T11 vertebral body with approximately 75% loss in vertebral body height. There is buckling of the anterior superior and posterosuperior cortex is. No retropulsion of fragments is seen. Paraspinal and other soft tissues: The paraspinal soft tissues and visualized retroperitoneal structures are unremarkable. The sacroiliac joints are intact. Biapical scarring/atelectasis is seen. Disc levels: Mild disc height loss with facet arthrosis is seen throughout the thoracic spine. At T10-T11 there is buckling of the superior cortex of T11 which causes mild canal narrowing. There is also mild neural foraminal narrowing. Lumbar spine: Segmentation: There are 5 non-rib bearing lumbar type vertebral bodies with the last intervertebral disc space labeled as L5-S1. Alignment: There is a mild S-shaped scoliotic curvature of the lumbar spine. Slight lateral listhesis of L2 on L3 and L3 on L4 seen. Vertebrae: The vertebral body heights are well maintained. No fracture, malalignment, or pathologic osseous lesions seen. Paraspinal and other soft tissues: The paraspinal soft tissues and visualized retroperitoneal structures are unremarkable. The sacroiliac joints are  intact. Scattered dense aortic atherosclerosis is noted. Disc levels: There is disc height loss with facet arthrosis and vacuum phenomenon seen throughout the lumbar spine. This is most notable at L2-L3 and L3-L4 with severe left neural foraminal narrowing and moderate central canal stenosis. IMPRESSION: 1. Age indeterminate superior compression deformity of the T11 vertebral body with 75% loss in vertebral body height. This is new since prior exam of 2018. There is buckling of the superior cortex. However no retropulsion of fragments. 2. Mild S-shaped scoliotic curvature of the lumbar spine. 3. Lumbar spine spondylosis most notable at L2-L3 and L3-L4 with severe left neural foraminal narrowing and moderate central canal stenosis. Electronically Signed   By: Jonna Clark M.D.   On: 03/31/2019 20:06   Dg Knee Complete 4 Views Left  Result Date: 03/31/2019 CLINICAL DATA:  Fall EXAM: LEFT KNEE - COMPLETE 4+ VIEW COMPARISON:  None FINDINGS: Osseous demineralization. Extensive chondrocalcinosis. Minimal joint space narrowing. No acute fracture, dislocation, or bone destruction. Scattered atherosclerotic calcifications. No knee joint effusion. IMPRESSION: Osseous demineralization with minimal degenerative changes and scattered chondrocalcinosis question CPPD. No acute osseous abnormalities. Electronically Signed   By: Ulyses Southward M.D.   On:  03/31/2019 17:47   Ct Angio Chest/abd/pel For Dissection W And/or Wo Contrast  Result Date: 03/31/2019 CLINICAL DATA:  85 year old with back pain after trip and fall. Minor trauma. Hypertension. Rule out dissection. EXAM: CT ANGIOGRAPHY CHEST, ABDOMEN AND PELVIS TECHNIQUE: Multidetector CT imaging through the chest, abdomen and pelvis was performed using the standard protocol during bolus administration of intravenous contrast. Multiplanar reconstructed images and MIPs were obtained and reviewed to evaluate the vascular anatomy. CONTRAST:  OMNIPAQUE IOHEXOL 350 MG/ML  SOLN COMPARISON:  Chest radiograph earlier this day. FINDINGS: CTA CHEST FINDINGS Cardiovascular: Noncontrast CT demonstrates no aortic hematoma. No aneurysm. Moderate atherosclerosis of the thoracic aorta and branch vessels. Post contrast there is no evidence of acute aortic injury, dissection, vasculitis or acute aortic syndrome. No filling defects in the central most pulmonary arteries to the lobar level. There are coronary artery calcifications. Borderline cardiomegaly. No pericardial effusion. Mediastinum/Nodes: Calcified mediastinal lymph nodes consistent with prior granulomatous disease. No noncalcified adenopathy. No aortic hematoma or hemorrhage. No pneumomediastinum. No esophageal wall thickening. No thyroid nodule. Lungs/Pleura: No pneumothorax. Biapical pleuroparenchymal scarring. Mild dependent atelectasis. Small intraparenchymal cyst in the left lower lobe specific clinical significance. No pulmonary edema or pleural effusion. Calcified granuloma in the right lower lobe. Trachea and mainstem bronchi are patent, mild motion artifact limitations. Musculoskeletal: Thoracic spine better assessed on dedicated thoracic spine reformats. T11 compression fracture. No acute fracture of the sternum, ribs, included clavicles or shoulder girdles. Review of the MIP images confirms the above findings. CTA ABDOMEN AND PELVIS FINDINGS VASCULAR Aorta: Atherosclerotic and tortuous. No dissection or evidence of acute injury. No frank aneurysmal dilatation. Celiac: No dissection or acute injury. Calcified plaque at the origin causes approximately 50% luminal stenosis. SMA: No dissection or acute injury. Calcified plaque at the origin causes approximately 50% luminal narrowing. Branch vessels are patent. Renals: Plaque at the origin of both renal arteries. Approximately 50% luminal narrowing the the origin of both renal arteries, left greater than right. IMA: Patent without evidence of aneurysm, dissection, vasculitis or  significant stenosis. Inflow: Atherosclerotic and tortuous. No dissection or acute abnormality. Veins: No obvious venous abnormality within the limitations of this arterial phase study. Review of the MIP images confirms the above findings. NON-VASCULAR Hepatobiliary: Arterial phase imaging limits evaluation for acute injury. No evidence of focal hepatic abnormality. No perihepatic hematoma. Gallbladder physiologically distended, no calcified stone. No biliary dilatation. Pancreas: No ductal dilatation or inflammation. Spleen: Normal in size and arterial phase enhancement. Mild motion artifact limits detailed assessment. No perisplenic hematoma. Adrenals/Urinary Tract: No renal or adrenal hemorrhage. No hydronephrosis. No perinephric edema. Distended urinary bladder without wall thickening. Stomach/Bowel: Colonic diverticulosis, advanced in the descending and sigmoid colon. Motion artifact limits detailed bowel assessment. No evidence of bowel inflammation or obstruction. No mesenteric hematoma. Lymphatic: No enlarged lymph nodes in the abdomen or pelvis. Reproductive: Uterus and bilateral adnexa are unremarkable. Other: No free air or free fluid. No confluent body wall contusion. Musculoskeletal: Minimally displaced left superior pubic ramus fracture extending to the pubic body. Nondisplaced left inferior pubic ramus fracture. Left hip joint remains intact. Lumbar spine better assessed on dedicated spine reformats. There are enthesopathic changes of the ischial tuberosities. Review of the MIP images confirms the above findings. IMPRESSION: 1. No aortic dissection or acute aortic injury. Diffuse aortic tortuosity and atherosclerosis. 2. Minimally displaced left superior and nondisplaced inferior pubic ramus fractures. 3. No additional acute traumatic injury to the chest, abdomen, or pelvis, allowing for limitations related to arterial phase imaging.  4. Colonic diverticulosis. 5. Thoracic and lumbar spine assessed on  separately reported spine reformats. Aortic Atherosclerosis (ICD10-I70.0). Electronically Signed   By: Narda Rutherford M.D.   On: 03/31/2019 20:07   Dg Hip Unilat W Or Wo Pelvis 2-3 Views Left  Result Date: 03/31/2019 CLINICAL DATA:  Left hip pain after fall. EXAM: DG HIP (WITH OR WITHOUT PELVIS) 2-3V LEFT COMPARISON:  None. FINDINGS: Mild diffuse osteopenia. Degenerative changes identified within the lumbar spine and both hips. No acute fracture or dislocation. IMPRESSION: 1. No acute findings. 2. Osteopenia and degenerative changes. Electronically Signed   By: Signa Kell M.D.   On: 03/31/2019 17:47    Lab Data:  CBC: Recent Labs  Lab 03/31/19 1759 04/01/19 0520  WBC 12.9* 13.5*  NEUTROABS 10.9* 11.9*  HGB 12.2 10.9*  HCT 39.0 34.4*  MCV 94.7 94.5  PLT 424* 371   Basic Metabolic Panel: Recent Labs  Lab 03/31/19 1759 04/01/19 0520  NA 140 140  K 3.8 4.0  CL 100 101  CO2 26 29  GLUCOSE 111* 116*  BUN 25* 22  CREATININE 1.05* 1.09*  CALCIUM 9.8 9.5   GFR: Estimated Creatinine Clearance: 28.6 mL/min (A) (by C-G formula based on SCr of 1.09 mg/dL (H)). Liver Function Tests: No results for input(s): AST, ALT, ALKPHOS, BILITOT, PROT, ALBUMIN in the last 168 hours. No results for input(s): LIPASE, AMYLASE in the last 168 hours. No results for input(s): AMMONIA in the last 168 hours. Coagulation Profile: No results for input(s): INR, PROTIME in the last 168 hours. Cardiac Enzymes: No results for input(s): CKTOTAL, CKMB, CKMBINDEX, TROPONINI in the last 168 hours. BNP (last 3 results) No results for input(s): PROBNP in the last 8760 hours. HbA1C: No results for input(s): HGBA1C in the last 72 hours. CBG: No results for input(s): GLUCAP in the last 168 hours. Lipid Profile: No results for input(s): CHOL, HDL, LDLCALC, TRIG, CHOLHDL, LDLDIRECT in the last 72 hours. Thyroid Function Tests: No results for input(s): TSH, T4TOTAL, FREET4, T3FREE, THYROIDAB in the last 72  hours. Anemia Panel: No results for input(s): VITAMINB12, FOLATE, FERRITIN, TIBC, IRON, RETICCTPCT in the last 72 hours. Urine analysis:    Component Value Date/Time   COLORURINE STRAW (A) 03/19/2018 1355   APPEARANCEUR CLEAR 03/19/2018 1355   LABSPEC 1.003 (L) 03/19/2018 1355   PHURINE 7.0 03/19/2018 1355   GLUCOSEU NEGATIVE 03/19/2018 1355   HGBUR NEGATIVE 03/19/2018 1355   BILIRUBINUR NEGATIVE 03/19/2018 1355   KETONESUR NEGATIVE 03/19/2018 1355   PROTEINUR NEGATIVE 03/19/2018 1355   NITRITE NEGATIVE 03/19/2018 1355   LEUKOCYTESUR TRACE (A) 03/19/2018 1355     Clent Damore M.D. [facility] 04/01/2019, 10:37 AM  Pager: [phone] Between 7am to 7pm - call Pager - [phone]  After 7pm go to [email or web address] - password [email or web address]  Call night coverage person covering after 7pm

## 2019-04-01 NOTE — Progress Notes (Signed)
Pt admitted to room 518, A/O x4, HOH, hearing aids in room on computer, pure wick in place, pain only with movement of left leg. Skin assessment with 2 nurses completed,  no skin issues noted.

## 2019-04-01 NOTE — Plan of Care (Signed)
Pt will remain free from infection while hospitalized.

## 2019-04-01 NOTE — TOC Initial Note (Signed)
Transition of Care [facility]) - Initial/Assessment Note    Patient Details  Name: [name] Love MRN: [id number] Date of Birth: Aug 28, 1922  [facility] Care [facility] CM/SW Contact:    Dessa Phi, RN Phone Number: 04/01/2019, 1:51 PM  Clinical Narrative:   From [facility]. PT cons await recc. Per [employer] they have a bed available. Need covid results, & CM to get auth if SNF needed, & if patient/dtr agree.                Expected Discharge Plan: Skilled Nursing Facility Barriers to Discharge: Continued Medical Work up   Patient Goals and CMS Choice Patient states their goals for this hospitalization and ongoing recovery are:: go home CMS Medicare.gov Compare Post Acute Care list provided to:: Patient Represenative (must comment)(dtr Nunzio Cory [phone])    Expected Discharge Plan and Services Expected Discharge Plan: [place]   Discharge Planning Services: CM Consult   Living arrangements for the past 2 months: [place]                                      Prior Living Arrangements/Services Living arrangements for the past 2 months: [place] Lives with:: Self Patient language and need for interpreter reviewed:: Yes Do you feel safe going back to the place where you live?: Yes      Need for Family Participation in Patient Care: Yes (Comment) Care giver support system in place?: Yes (comment)   Criminal Activity/Legal Involvement Pertinent to Current Situation/Hospitalization: Yes - Comment as needed  Activities of Daily Living Home Assistive Devices/Equipment: Walker (specify type) ADL Screening (condition at time of admission) Patient's cognitive ability adequate to safely complete daily activities?: Yes Is the patient deaf or have difficulty hearing?: No Does the patient have difficulty seeing, even when wearing glasses/contacts?: No Does the patient have difficulty concentrating,  remembering, or making decisions?: No Patient able to express need for assistance with ADLs?: Yes Does the patient have difficulty dressing or bathing?: Yes Independently performs ADLs?: No Does the patient have difficulty walking or climbing stairs?: Yes Weakness of Legs: Left Weakness of Arms/Hands: None  Permission Sought/Granted Permission sought to share information with : Case Manager Permission granted to share information with : Yes, Verbal Permission Granted  Share Information with NAMEJuliann Pulse [facility] [phone]           Emotional Assessment Appearance:: Appears stated age Attitude/Demeanor/Rapport: Gracious Affect (typically observed): Accepting Orientation: : Oriented to Self Alcohol / Substance Use: Not Applicable Psych Involvement: No (comment)  Admission diagnosis:  Back pain [M54.9] Pain [R52] Hypertensive urgency [I16.0] Closed nondisplaced fracture of pelvis, unspecified part of pelvis, initial encounter ([place]) [S32.9XXA] Patient Active Problem List   Diagnosis Date Noted  . Pubic ramus fracture, left, closed, initial encounter ([place]) 03/31/2019  . Elevated troponin 03/31/2019  . Hypertensive urgency 03/31/2019  . Acute respiratory failure with hypoxia ([place]) 03/31/2019  . Hypothyroidism 03/31/2019  . CKD (chronic kidney disease), stage III 03/31/2019  . Orthostatic hypotension 03/31/2019  . Carotid stenosis 03/09/2014  . Aftercare following surgery of the circulatory system 03/09/2014  . Aftercare following surgery of the circulatory system, [place] 03/08/2013  . Occlusion and stenosis of carotid artery without mention of cerebral infarction 02/04/2012   PCP:  Lajean Manes, MD Pharmacy:   [facility] - [address], [place]  [address] [place] [address] Phone: [phone] Fax: [phone]  [facility] - [place], [place] - [address] [place] [address] Phone: [phone] Fax:  [phone]  [facility] - [place], [place] - [address] [place] [address] Phone: [phone] Fax: [phone]  [facility] - [place], [place] - [address] [place] [address] Phone: [phone] Fax: [phone]  [id number] [place], [place] - [address] AT [facility] [address] [place] [address] Phone: [phone] Fax: [phone]     Social Determinants of Health (SDOH) Interventions    Readmission Risk Interventions No flowsheet data found.

## 2019-04-01 NOTE — Progress Notes (Signed)
Spoke with daughter,Kathleen and updated on patient's condition. Nunzio Cory notified that patient's PCP has a target BP of 140/90.

## 2019-04-01 NOTE — Progress Notes (Addendum)
PT Cancellation Note  Patient Details Name: [name] Love MRN: [id number] DOB: 1923-01-27   Cancelled Treatment:    Reason Eval/Treat Not Completed: Patient at procedure or test/unavailable. Pt about to eat breakfast and requesting pain meds prior to getting up.  Will check back.  Addendum: Checked back about 45 mins after pt had pain meds. She is confused and falling asleep as PT attempting to speak with her. Nurse reports they are going to do a UA. Will check back later today.   Galen Manila 04/01/2019, 9:15 AM

## 2019-04-01 NOTE — Evaluation (Addendum)
Physical Therapy Evaluation Patient Details Name: [name] Love MRN: [id number] DOB: 06/14/1922 Today's Date: 04/01/2019   History of Present Illness  Patient is a 85 year old female with history of hypertension, orthostatic hypotension, hypothyroidism, CKD stage III presented to ED with left hip pain after a mechanical fall. She was found to have a minimally displaced superior pubic ramus fracture and nondisplaced inferior pubic ramus fracture left side with no indication for surgery and WBAT per ortho services.  Clinical Impression  Pt admitted with above diagnosis.  Pt currently with functional limitations due to the deficits listed below (see PT Problem List). Pt will benefit from skilled PT to increase their independence and safety with mobility to allow discharge to the venue listed below.  Pt with some confusion noted during eval and needing increased time for processing. At end of session, she was not as alert as at the beginning and nursing aware.  She was able to ambulate short distance with RW, but is not safe to return to her independent living apartment and recommend SNF.     Follow Up Recommendations SNF    Equipment Recommendations  None recommended by PT    Recommendations for Other Services       Precautions / Restrictions Precautions Precautions: Fall Restrictions Weight Bearing Restrictions: No(WBAT per Dr. Luiz Blare 10/22 note)      Mobility  Bed Mobility Overal bed mobility: Needs Assistance Bed Mobility: Supine to Sit     Supine to sit: Mod assist     General bed mobility comments: Use of bed pad to assist in getting to EOB with L LE supported  Transfers Overall transfer level: Needs assistance Equipment used: Rolling walker (2 wheeled) Transfers: Sit to/from [employer] Sit to Stand: Mod assist;Min assist Stand pivot transfers: Min assist       General transfer comment: MOD A to stand from bed and MIN A to stand from low BSC, but  with rails present. SPT with MIN A and cueing for steps.  Ambulation/Gait Ambulation/Gait assistance: Min assist Gait Distance (Feet): 5 Feet Assistive device: Rolling walker (2 wheeled) Gait Pattern/deviations: Step-to pattern;Antalgic Gait velocity: decreased   General Gait Details: Antalgic gait pattern for short distance with cues to use UE to A with WB'ing.  Stairs            Wheelchair Mobility    Modified Rankin (Stroke Patients Only)       Balance Overall balance assessment: History of Falls;Needs assistance   Sitting balance-Leahy Scale: Fair     Standing balance support: Bilateral upper extremity supported Standing balance-Leahy Scale: Poor Standing balance comment: requires UE support                             Pertinent Vitals/Pain Pain Assessment: Faces Faces Pain Scale: Hurts even more Pain Location: L pelvis with mobility and WB Pain Descriptors / Indicators: Grimacing;Moaning Pain Intervention(s): Limited activity within patient's tolerance;Monitored during session;Premedicated before session;Repositioned    Home Living Family/patient expects to be discharged to:: Private residence Living Arrangements: Alone(Independent living)   Type of Home: Independent living facility       Home Layout: One level Home Equipment: Environmental consultant - 2 wheels      Prior Function Level of Independence: Independent with assistive device(s)         Comments: Amb with RW     Hand Dominance        Extremity/Trunk Assessment   Upper Extremity  Assessment Upper Extremity Assessment: Overall WFL for tasks assessed    Lower Extremity Assessment Lower Extremity Assessment: Generalized weakness    Cervical / Trunk Assessment Cervical / Trunk Assessment: Kyphotic  Communication      Cognition Arousal/Alertness: Awake/alert;Lethargic Behavior During Therapy: WFL for tasks assessed/performed Overall Cognitive Status: Impaired/Different from  baseline Area of Impairment: Memory;Following commands;Safety/judgement;Problem solving                     Memory: Decreased short-term memory Following Commands: Follows one step commands inconsistently;Follows one step commands with increased time Safety/Judgement: Decreased awareness of safety   Problem Solving: Slow processing;Difficulty sequencing;Requires verbal cues;Requires tactile cues General Comments: Pt more alert at beginning of session. Once she got into recliner, she became more lethargic and sleepy. Nursing notified.      General Comments      Exercises     Assessment/Plan    PT Assessment Patient needs continued PT services  PT Problem List Decreased strength;Decreased activity tolerance;Decreased balance;Decreased knowledge of use of DME;Decreased mobility;Decreased cognition;Decreased safety awareness       PT Treatment Interventions DME instruction;Gait training;Functional mobility training;Neuromuscular re-education;Balance training;Therapeutic exercise;Therapeutic activities;Patient/family education    PT Goals (Current goals can be found in the Care Plan section)  Acute Rehab PT Goals Patient Stated Goal: Pt agreeable to get OOB PT Goal Formulation: With patient Time For Goal Achievement: 04/14/19 Potential to Achieve Goals: Good    Frequency Min 2X/week   Barriers to discharge        Co-evaluation               AM-PAC PT "6 Clicks" Mobility  Outcome Measure Help needed turning from your back to your side while in a flat bed without using bedrails?: A Lot Help needed moving from lying on your back to sitting on the side of a flat bed without using bedrails?: A Lot Help needed moving to and from a bed to a chair (including a wheelchair)?: A Little Help needed standing up from a chair using your arms (e.g., wheelchair or bedside chair)?: A Little Help needed to walk in hospital room?: A Little Help needed climbing 3-5 steps with a  railing? : A Lot 6 Click Score: 15    End of Session Equipment Utilized During Treatment: Gait belt Activity Tolerance: Patient limited by fatigue Patient left: in chair;with call bell/phone within reach;with chair alarm set Nurse Communication: Mobility status;Weight bearing status PT Visit Diagnosis: Unsteadiness on feet (R26.81);Muscle weakness (generalized) (M62.81);Difficulty in walking, not elsewhere classified (R26.2)    Time: 6294-7654 PT Time Calculation (min) (ACUTE ONLY): 42 min   Charges:   PT Evaluation $PT Eval Moderate Complexity: 1 Mod PT Treatments $Therapeutic Activity: 23-37 mins        Blonnie Maske L. Tamala Julian, [place] Pager [phone] 04/01/2019   Galen Manila 04/01/2019, 2:44 PM

## 2019-04-02 LAB — BASIC METABOLIC PANEL
Anion gap: 13 (ref 5–15)
BUN: 27 mg/dL — ABNORMAL HIGH (ref 8–23)
CO2: 25 mmol/L (ref 22–32)
Calcium: 9.4 mg/dL (ref 8.9–10.3)
Chloride: 98 mmol/L (ref 98–111)
Creatinine, Ser: 1.19 mg/dL — ABNORMAL HIGH (ref 0.44–1.00)
GFR calc Af Amer: 46 mL/min — ABNORMAL LOW (ref >60)
GFR calc non Af Amer: 39 mL/min — ABNORMAL LOW (ref >60)
Glucose, Bld: 105 mg/dL — ABNORMAL HIGH (ref 70–99)
Potassium: 3.8 mmol/L (ref 3.5–5.1)
Sodium: 136 mmol/L (ref 135–145)

## 2019-04-02 LAB — CBC
HCT: 34.9 % — ABNORMAL LOW (ref 36.0–46.0)
Hemoglobin: 11 g/dL — ABNORMAL LOW (ref 12.0–15.0)
MCH: 29.7 pg (ref 26.0–34.0)
MCHC: 31.5 g/dL (ref 30.0–36.0)
MCV: 94.3 fL (ref 80.0–100.0)
Platelets: 332 10*3/uL (ref 150–400)
RBC: 3.7 MIL/uL — ABNORMAL LOW (ref 3.87–5.11)
RDW: 13.7 % (ref 11.5–15.5)
WBC: 14.2 10*3/uL — ABNORMAL HIGH (ref 4.0–10.5)
nRBC: 0 % (ref 0.0–0.2)

## 2019-04-02 MED ORDER — ACETAMINOPHEN 500 MG PO TABS
1000.0000 mg | ORAL_TABLET | Freq: Three times a day (TID) | ORAL | Status: AC
  Administered 2019-04-02 – 2019-04-04 (×6): 1000 mg via ORAL
  Filled 2019-04-02 (×6): qty 2

## 2019-04-02 MED ORDER — AMLODIPINE BESYLATE 5 MG PO TABS
5.0000 mg | ORAL_TABLET | Freq: Every day | ORAL | Status: DC
  Administered 2019-04-02 – 2019-04-03 (×2): 5 mg via ORAL
  Filled 2019-04-02: qty 1

## 2019-04-02 NOTE — Progress Notes (Signed)
[facility]                                                                              Patient Demographics  [name] Love, is a 85 y.o. female, DOB - 1922-11-25, EAV:[id number]  Admit date - 03/31/2019   Admitting Physician Briscoe Deutscher, MD  Outpatient Primary MD for the patient is Merlene Laughter, MD  Outpatient specialists:   LOS - 1  days   Medical records reviewed and are as summarized below:    Chief Complaint  Patient presents with   Hip Pain       Brief summary   Patient is a 85 year old female with history of hypertension, orthostatic hypotension, hypothyroidism, CKD stage III presented to ED with left hip pain after a mechanical fall.  Patient had gone to the mailbox, lost balance on her way back, fell onto her left hip and then experiencing severe pain. In ED, patient was found to be afebrile, O2 sats initially normal but later dropped into mid 80s on room air, severely hypertensive.  EKG showed sinus rhythm with anterior ST elevation appears similar to prior EKGs.  Chest x-ray negative.  On CT, left superior and inferior pubic rami fractures were noted.  Age-indeterminate T11 compression fracture.  Orthopedic surgery was consulted, seen by Dr. Luiz Blare, nonoperative  Patient was admitted for pain control, PT OT and may need skilled nursing facility. COVID 19 negative 10/22  Assessment & Plan    Principal Problem:   Pubic ramus fracture, left, closed, initial encounter (HCC) -Status post mechanical fall, CT showed minimally displaced superior pubic ramus fracture and nondisplaced inferior pubic ramusi fracture left side -Orthopedics consulted, seen by Dr. Luiz Blare, recommended no indication for surgical intervention.  - Weightbearing as tolerated ambulation with a walker.  Outpatient follow-up in 2 weeks - Cont scheduled Tylenol 1000 mg 3 times daily, avoid narcotics - Pain 4/10 on minimal movement  Active Problems: Mildly elevated  troponin -Likely due to hypertensive urgency, no chest pain or shortness of breath.     Hypertensive urgency - BP uncontrolled, likely worse due to pain.  BP > 200 - Patient started on low-dose amlodipine, Florinef was held however patient's daughter very concerned that patient gets orthostatic hypotension if not given Florinef (ongoing chronic issue for last 3 years) - Avoid strict BP control due to history of orthostatic hypotension, target BP ~140/90 - BP still elevated, cont norvasc.  Continues to have persistently very high BP, will hold Florinef     Acute respiratory failure with hypoxia (HCC) -Unclear etiology, CT angiogram of the chest abdomen pelvis showed no aortic dissection or injury no pulmonary embolism mention.  No pulmonary edema or pleural effusion, pneumonia.  Possible atelectasis due to pain. -Continue incentive spirometry -O2 sats 96% on 2 L wean O2 as tolerated    Hypothyroidism -Continue Synthroid 112 mcg daily    CKD (chronic kidney disease), stage III -Creatinine currently at baseline, follow closely    Orthostatic hypotension -Florinef was held in the light of severe hypertension.   - will restart Florinef at half dose to avoid orthostatic hypotension, patient will need to do PT  Code Status: DNR DVT Prophylaxis:  SCD's Family Communication: Discussed all imaging results, lab results, explained to the patient's daughter in detail on the phone  Disposition Plan: PT evaluation recommended skilled nursing facility.  Social work consulted, although patient's daughter wants her to go back to the familiar surroundings however okay with skilled nursing facility as well.  SW will need to ask [facility] if they are able to provide skilled care.   Time Spent in minutes 25 minutes  Procedures:  CT chest abdomen pelvis  Consultants:   Orthopedics  Antimicrobials:   Anti-infectives (From admission, onward)   None         Medications  Scheduled  Meds:  acetaminophen  1,000 mg Oral TID   amLODipine  5 mg Oral Daily   levothyroxine  112 mcg Oral Q0600   sodium chloride flush  3 mL Intravenous Q12H   sodium chloride flush  3 mL Intravenous Q12H   Continuous Infusions:  sodium chloride     PRN Meds:.sodium chloride, acetaminophen **OR** acetaminophen, ketorolac, labetalol, ondansetron **OR** ondansetron (ZOFRAN) IV, polyethylene glycol, sodium chloride flush      Subjective:   [name] Love was seen and examined today.  BP continues to stay high, denies any headache any blurry vision.  Pain in the left hip on moving the leg.  No chest pain dizziness or shortness of breath.   Patient denies abdominal pain, N/V/D/C, new weakness, numbess, tingling.   Objective:   Vitals:   04/02/19 0537 04/02/19 0627 04/02/19 0800 04/02/19 0913  BP: (!) 196/98 (!) 193/85 (!) 155/82 (!) 190/99  Pulse: 91 85 80   Resp: 16     Temp: 98.1 F (36.7 C)     TempSrc:      SpO2: 96%     Weight:      Height:        Intake/Output Summary (Last 24 hours) at 04/02/2019 1405 Last data filed at 04/02/2019 1000 Gross per 24 hour  Intake 260 ml  Output --  Net 260 ml     Wt Readings from Last 3 Encounters:  03/31/19 56.2 kg  04/14/17 66.2 kg  03/31/17 68 kg   Physical Exam  General: Alert and oriented x 3, NAD  Eyes:   HEENT:    Cardiovascular: S1 S2 clear, no murmurs, RRR. No pedal edema b/l  Respiratory: CTAB, no wheezing, rales or rhonchi  Gastrointestinal: Soft, nontender, nondistended, NBS  Ext: no pedal edema bilaterally  Neuro: no new deficits  Musculoskeletal: No cyanosis, clubbing  Skin: No rashes  Psych: Normal affect and demeanor, alert and oriented x3    Data Reviewed:  I have personally reviewed following labs and imaging studies  Micro Results Recent Results (from the past 240 hour(s))  SARS CORONAVIRUS 2 (TAT 6-24 HRS) Nasopharyngeal Nasopharyngeal Swab     Status: None   Collection Time: 03/31/19  10:57 PM   Specimen: Nasopharyngeal Swab  Result Value Ref Range Status   SARS Coronavirus 2 NEGATIVE NEGATIVE Final    Comment: (NOTE) SARS-CoV-2 target nucleic acids are NOT DETECTED. The SARS-CoV-2 RNA is generally detectable in upper and lower respiratory specimens during the acute phase of infection. Negative results do not preclude SARS-CoV-2 infection, do not rule out co-infections with other pathogens, and should not be used as the sole basis for treatment or other patient management decisions. Negative results must be combined with clinical observations, patient history, and epidemiological information. The expected result is Negative. Fact Sheet for Patients: HairSlick.nohttps://www.fda.gov/media/138098/download Fact Sheet  for Healthcare Providers: quierodirigir.com This test is not yet approved or cleared by the [place] and  has been authorized for detection and/or diagnosis of SARS-CoV-2 by FDA under an Emergency Use Authorization (EUA). This EUA will remain  in effect (meaning this test can be used) for the duration of the COVID-19 declaration under Section 56 4(b)(1) of the Act, 21 U.S.C. section 360bbb-3(b)(1), unless the authorization is terminated or revoked sooner. Performed at [facility], [address], [place], [place] [address]   Urine Culture     Status: Abnormal (Preliminary result)   Collection Time: 04/01/19  2:55 PM   Specimen: Urine, Clean Catch  Result Value Ref Range Status   Specimen Description   Final    URINE, CLEAN CATCH Performed at [facility], [address], [place], [place] [address]    Special Requests   Final    NONE Performed at [facility], [address] [place], [place] [address]    Culture (A)  Final    30,000 COLONIES/mL KLEBSIELLA PNEUMONIAE SUSCEPTIBILITIES TO FOLLOW Performed at [facility], [address], [place], [place] [address]    Report  Status PENDING  Incomplete    Radiology Reports Dg Chest 1 View  Result Date: 03/31/2019 CLINICAL DATA:  Fall EXAM: CHEST  1 VIEW COMPARISON:  03/17/2017 FINDINGS: Normal heart size. No pericardial effusion. Diffuse coarsened interstitial markings identified bilaterally. No superimposed pleural effusion, pulmonary edema or airspace consolidation. IMPRESSION: No active disease. Electronically Signed   By: Signa Kell M.D.   On: 03/31/2019 17:45   Dg Lumbar Spine Complete  Result Date: 03/31/2019 CLINICAL DATA:  Per EMS, patient from [facility] assisted living, c/o left hip pain after trip and fall. Pt c/o left knee pain. Best images obtained. EXAM: LUMBAR SPINE - COMPLETE 4+ VIEW COMPARISON:  08/02/2008 FINDINGS: There is marked degenerative change throughout the lumbar spine. Significant disc height loss and osteophytes throughout all levels. Mild scoliosis, mild mid lumbar scoliosis is stable. There has been wedge compression fracture of T11 since the prior study. This is only seen on the anterior and oblique images. The age of this fracture is not determined. There is atherosclerotic calcification of the abdominal aorta. Visualized bowel gas pattern is nonobstructive. IMPRESSION: 1. T11 compression fracture, new since the prior study and of indeterminate age. Given the history of fall, consider thoracic spine series. 2. Marked degenerative changes throughout the lumbar spine. Electronically Signed   By: Norva Pavlov M.D.   On: 03/31/2019 17:47   Ct Head Wo Contrast  Result Date: 03/31/2019 CLINICAL DATA:  Per EMS, patient from [facility] assisted living, c/o left hip pain after trip and fall. Denies LOC. No deformity noted. Denies taking blood thinners. Answers questions appropriately EXAM: CT HEAD WITHOUT CONTRAST CT CERVICAL SPINE WITHOUT CONTRAST TECHNIQUE: Multidetector CT imaging of the head and cervical spine was performed following the standard protocol without intravenous  contrast. Multiplanar CT image reconstructions of the cervical spine were also generated. COMPARISON:  03/31/2017 FINDINGS: CT HEAD FINDINGS Brain: There is central and cortical atrophy. Periventricular white matter changes are consistent with small vessel disease. There is no intra or extra-axial fluid collection or mass lesion. The basilar cisterns and ventricles have a normal appearance. There is no CT evidence for acute infarction or hemorrhage. Vascular: There is atherosclerotic calcification of the internal carotid arteries. No hyperdense vessels. Skull: Normal. Negative for fracture or focal lesion. Sinuses/Orbits: Paranasal sinus mucosal thickening.  No air-fluid levels or evidence for sinus wall fractures. Other: LEFT parietal scalp edema/hematoma without underlying fracture. CT CERVICAL SPINE FINDINGS Alignment: Normal. Mild convex RIGHT scoliosis Skull base and vertebrae: No acute fracture. No primary bone lesion or focal pathologic process. Soft tissues and spinal canal: No prevertebral fluid or swelling. No visible canal hematoma. Disc levels: Significant degenerative changes throughout the cervical spine, most notably at C4-5 and C5-6. Upper chest: Biapical pleuroparenchymal changes at the lung apices. Other: There is dense atherosclerotic calcification of the carotid arteries. IMPRESSION: 1. Atrophy and small vessel disease. 2. No evidence for acute intracranial abnormality. 3. LEFT parietal scalp edema/hematoma without underlying fracture. 4. Significant degenerative changes in the cervical spine. 5. No evidence for acute cervical spine abnormality. 6. Dense atherosclerotic calcification of the common and internal carotid arteries. Electronically Signed   By: Norva Pavlov M.D.   On: 03/31/2019 18:06   Ct Cervical Spine Wo Contrast  Result Date: 03/31/2019 CLINICAL DATA:  Per EMS, patient from [facility] assisted living, c/o left hip pain after trip and fall. Denies LOC. No deformity noted.  Denies taking blood thinners. Answers questions appropriately EXAM: CT HEAD WITHOUT CONTRAST CT CERVICAL SPINE WITHOUT CONTRAST TECHNIQUE: Multidetector CT imaging of the head and cervical spine was performed following the standard protocol without intravenous contrast. Multiplanar CT image reconstructions of the cervical spine were also generated. COMPARISON:  03/31/2017 FINDINGS: CT HEAD FINDINGS Brain: There is central and cortical atrophy. Periventricular white matter changes are consistent with small vessel disease. There is no intra or extra-axial fluid collection or mass lesion. The basilar cisterns and ventricles have a normal appearance. There is no CT evidence for acute infarction or hemorrhage. Vascular: There is atherosclerotic calcification of the internal carotid arteries. No hyperdense vessels. Skull: Normal. Negative for fracture or focal lesion. Sinuses/Orbits: Paranasal sinus mucosal thickening. No air-fluid levels or evidence for sinus wall fractures. Other: LEFT parietal scalp edema/hematoma without underlying fracture. CT CERVICAL SPINE FINDINGS Alignment: Normal. Mild convex RIGHT scoliosis Skull base and vertebrae: No acute fracture. No primary bone lesion or focal pathologic process. Soft tissues and spinal canal: No prevertebral fluid or swelling. No visible canal hematoma. Disc levels: Significant degenerative changes throughout the cervical spine, most notably at C4-5 and C5-6. Upper chest: Biapical pleuroparenchymal changes at the lung apices. Other: There is dense atherosclerotic calcification of the carotid arteries. IMPRESSION: 1. Atrophy and small vessel disease. 2. No evidence for acute intracranial abnormality. 3. LEFT parietal scalp edema/hematoma without underlying fracture. 4. Significant degenerative changes in the cervical spine. 5. No evidence for acute cervical spine abnormality. 6. Dense atherosclerotic calcification of the common and internal carotid arteries.  Electronically Signed   By: Norva Pavlov M.D.   On: 03/31/2019 18:06   Ct T-spine No Charge  Result Date: 03/31/2019 CLINICAL DATA:  Blood fall, back pain, hypertension EXAM: CT LUMBAR SPINE WITHOUT CONTRAST TECHNIQUE: Multidetector CT imaging of the lumbar spine was performed without intravenous contrast administration. Multiplanar CT image reconstructions were also generated. COMPARISON:  None. FINDINGS: Thoracic spine: Alignment: Normal Vertebrae: There is a superior compression deformity of the T11 vertebral body with approximately 75% loss in vertebral body height. There is buckling of the anterior superior and posterosuperior cortex is. No retropulsion of fragments is seen. Paraspinal and other soft tissues: The paraspinal soft tissues and visualized retroperitoneal structures are unremarkable. The sacroiliac joints are intact. Biapical scarring/atelectasis is seen. Disc levels: Mild disc height loss with facet arthrosis is seen throughout the thoracic spine. At  T10-T11 there is buckling of the superior cortex of T11 which causes mild canal narrowing. There is also mild neural foraminal narrowing. Lumbar spine: Segmentation: There are 5 non-rib bearing lumbar type vertebral bodies with the last intervertebral disc space labeled as L5-S1. Alignment: There is a mild S-shaped scoliotic curvature of the lumbar spine. Slight lateral listhesis of L2 on L3 and L3 on L4 seen. Vertebrae: The vertebral body heights are well maintained. No fracture, malalignment, or pathologic osseous lesions seen. Paraspinal and other soft tissues: The paraspinal soft tissues and visualized retroperitoneal structures are unremarkable. The sacroiliac joints are intact. Scattered dense aortic atherosclerosis is noted. Disc levels: There is disc height loss with facet arthrosis and vacuum phenomenon seen throughout the lumbar spine. This is most notable at L2-L3 and L3-L4 with severe left neural foraminal narrowing and moderate  central canal stenosis. IMPRESSION: 1. Age indeterminate superior compression deformity of the T11 vertebral body with 75% loss in vertebral body height. This is new since prior exam of 2018. There is buckling of the superior cortex. However no retropulsion of fragments. 2. Mild S-shaped scoliotic curvature of the lumbar spine. 3. Lumbar spine spondylosis most notable at L2-L3 and L3-L4 with severe left neural foraminal narrowing and moderate central canal stenosis. Electronically Signed   By: Jonna Clark M.D.   On: 03/31/2019 20:06   Ct L-spine No Charge  Result Date: 03/31/2019 CLINICAL DATA:  Blood fall, back pain, hypertension EXAM: CT LUMBAR SPINE WITHOUT CONTRAST TECHNIQUE: Multidetector CT imaging of the lumbar spine was performed without intravenous contrast administration. Multiplanar CT image reconstructions were also generated. COMPARISON:  None. FINDINGS: Thoracic spine: Alignment: Normal Vertebrae: There is a superior compression deformity of the T11 vertebral body with approximately 75% loss in vertebral body height. There is buckling of the anterior superior and posterosuperior cortex is. No retropulsion of fragments is seen. Paraspinal and other soft tissues: The paraspinal soft tissues and visualized retroperitoneal structures are unremarkable. The sacroiliac joints are intact. Biapical scarring/atelectasis is seen. Disc levels: Mild disc height loss with facet arthrosis is seen throughout the thoracic spine. At T10-T11 there is buckling of the superior cortex of T11 which causes mild canal narrowing. There is also mild neural foraminal narrowing. Lumbar spine: Segmentation: There are 5 non-rib bearing lumbar type vertebral bodies with the last intervertebral disc space labeled as L5-S1. Alignment: There is a mild S-shaped scoliotic curvature of the lumbar spine. Slight lateral listhesis of L2 on L3 and L3 on L4 seen. Vertebrae: The vertebral body heights are well maintained. No fracture,  malalignment, or pathologic osseous lesions seen. Paraspinal and other soft tissues: The paraspinal soft tissues and visualized retroperitoneal structures are unremarkable. The sacroiliac joints are intact. Scattered dense aortic atherosclerosis is noted. Disc levels: There is disc height loss with facet arthrosis and vacuum phenomenon seen throughout the lumbar spine. This is most notable at L2-L3 and L3-L4 with severe left neural foraminal narrowing and moderate central canal stenosis. IMPRESSION: 1. Age indeterminate superior compression deformity of the T11 vertebral body with 75% loss in vertebral body height. This is new since prior exam of 2018. There is buckling of the superior cortex. However no retropulsion of fragments. 2. Mild S-shaped scoliotic curvature of the lumbar spine. 3. Lumbar spine spondylosis most notable at L2-L3 and L3-L4 with severe left neural foraminal narrowing and moderate central canal stenosis. Electronically Signed   By: Jonna Clark M.D.   On: 03/31/2019 20:06   Dg Knee Complete 4 Views Left  Result  Date: 03/31/2019 CLINICAL DATA:  Fall EXAM: LEFT KNEE - COMPLETE 4+ VIEW COMPARISON:  None FINDINGS: Osseous demineralization. Extensive chondrocalcinosis. Minimal joint space narrowing. No acute fracture, dislocation, or bone destruction. Scattered atherosclerotic calcifications. No knee joint effusion. IMPRESSION: Osseous demineralization with minimal degenerative changes and scattered chondrocalcinosis question CPPD. No acute osseous abnormalities. Electronically Signed   By: Ulyses Southward M.D.   On: 03/31/2019 17:47   Ct Angio Chest/abd/pel For Dissection W And/or Wo Contrast  Result Date: 03/31/2019 CLINICAL DATA:  85 year old with back pain after trip and fall. Minor trauma. Hypertension. Rule out dissection. EXAM: CT ANGIOGRAPHY CHEST, ABDOMEN AND PELVIS TECHNIQUE: Multidetector CT imaging through the chest, abdomen and pelvis was performed using the standard protocol  during bolus administration of intravenous contrast. Multiplanar reconstructed images and MIPs were obtained and reviewed to evaluate the vascular anatomy. CONTRAST:  OMNIPAQUE IOHEXOL 350 MG/ML SOLN COMPARISON:  Chest radiograph earlier this day. FINDINGS: CTA CHEST FINDINGS Cardiovascular: Noncontrast CT demonstrates no aortic hematoma. No aneurysm. Moderate atherosclerosis of the thoracic aorta and branch vessels. Post contrast there is no evidence of acute aortic injury, dissection, vasculitis or acute aortic syndrome. No filling defects in the central most pulmonary arteries to the lobar level. There are coronary artery calcifications. Borderline cardiomegaly. No pericardial effusion. Mediastinum/Nodes: Calcified mediastinal lymph nodes consistent with prior granulomatous disease. No noncalcified adenopathy. No aortic hematoma or hemorrhage. No pneumomediastinum. No esophageal wall thickening. No thyroid nodule. Lungs/Pleura: No pneumothorax. Biapical pleuroparenchymal scarring. Mild dependent atelectasis. Small intraparenchymal cyst in the left lower lobe specific clinical significance. No pulmonary edema or pleural effusion. Calcified granuloma in the right lower lobe. Trachea and mainstem bronchi are patent, mild motion artifact limitations. Musculoskeletal: Thoracic spine better assessed on dedicated thoracic spine reformats. T11 compression fracture. No acute fracture of the sternum, ribs, included clavicles or shoulder girdles. Review of the MIP images confirms the above findings. CTA ABDOMEN AND PELVIS FINDINGS VASCULAR Aorta: Atherosclerotic and tortuous. No dissection or evidence of acute injury. No frank aneurysmal dilatation. Celiac: No dissection or acute injury. Calcified plaque at the origin causes approximately 50% luminal stenosis. SMA: No dissection or acute injury. Calcified plaque at the origin causes approximately 50% luminal narrowing. Branch vessels are patent. Renals: Plaque at the  origin of both renal arteries. Approximately 50% luminal narrowing the the origin of both renal arteries, left greater than right. IMA: Patent without evidence of aneurysm, dissection, vasculitis or significant stenosis. Inflow: Atherosclerotic and tortuous. No dissection or acute abnormality. Veins: No obvious venous abnormality within the limitations of this arterial phase study. Review of the MIP images confirms the above findings. NON-VASCULAR Hepatobiliary: Arterial phase imaging limits evaluation for acute injury. No evidence of focal hepatic abnormality. No perihepatic hematoma. Gallbladder physiologically distended, no calcified stone. No biliary dilatation. Pancreas: No ductal dilatation or inflammation. Spleen: Normal in size and arterial phase enhancement. Mild motion artifact limits detailed assessment. No perisplenic hematoma. Adrenals/Urinary Tract: No renal or adrenal hemorrhage. No hydronephrosis. No perinephric edema. Distended urinary bladder without wall thickening. Stomach/Bowel: Colonic diverticulosis, advanced in the descending and sigmoid colon. Motion artifact limits detailed bowel assessment. No evidence of bowel inflammation or obstruction. No mesenteric hematoma. Lymphatic: No enlarged lymph nodes in the abdomen or pelvis. Reproductive: Uterus and bilateral adnexa are unremarkable. Other: No free air or free fluid. No confluent body wall contusion. Musculoskeletal: Minimally displaced left superior pubic ramus fracture extending to the pubic body. Nondisplaced left inferior pubic ramus fracture. Left hip joint remains intact. Lumbar spine better  assessed on dedicated spine reformats. There are enthesopathic changes of the ischial tuberosities. Review of the MIP images confirms the above findings. IMPRESSION: 1. No aortic dissection or acute aortic injury. Diffuse aortic tortuosity and atherosclerosis. 2. Minimally displaced left superior and nondisplaced inferior pubic ramus fractures. 3.  No additional acute traumatic injury to the chest, abdomen, or pelvis, allowing for limitations related to arterial phase imaging. 4. Colonic diverticulosis. 5. Thoracic and lumbar spine assessed on separately reported spine reformats. Aortic Atherosclerosis (ICD10-I70.0). Electronically Signed   By: Keith Rake M.D.   On: 03/31/2019 20:07   Dg Hip Unilat W Or Wo Pelvis 2-3 Views Left  Result Date: 03/31/2019 CLINICAL DATA:  Left hip pain after fall. EXAM: DG HIP (WITH OR WITHOUT PELVIS) 2-3V LEFT COMPARISON:  None. FINDINGS: Mild diffuse osteopenia. Degenerative changes identified within the lumbar spine and both hips. No acute fracture or dislocation. IMPRESSION: 1. No acute findings. 2. Osteopenia and degenerative changes. Electronically Signed   By: Kerby Moors M.D.   On: 03/31/2019 17:47    Lab Data:  CBC: Recent Labs  Lab 03/31/19 1759 04/01/19 0520 04/02/19 0529  WBC 12.9* 13.5* 14.2*  NEUTROABS 10.9* 11.9*  --   HGB 12.2 10.9* 11.0*  HCT 39.0 34.4* 34.9*  MCV 94.7 94.5 94.3  PLT 424* 371 536   Basic Metabolic Panel: Recent Labs  Lab 03/31/19 1759 04/01/19 0520 04/02/19 0529  NA 140 140 136  K 3.8 4.0 3.8  CL 100 101 98  CO2 26 29 25   GLUCOSE 111* 116* 105*  BUN 25* 22 27*  CREATININE 1.05* 1.09* 1.19*  CALCIUM 9.8 9.5 9.4   GFR: Estimated Creatinine Clearance: 26.2 mL/min (A) (by C-G formula based on SCr of 1.19 mg/dL (H)). Liver Function Tests: No results for input(s): AST, ALT, ALKPHOS, BILITOT, PROT, ALBUMIN in the last 168 hours. No results for input(s): LIPASE, AMYLASE in the last 168 hours. No results for input(s): AMMONIA in the last 168 hours. Coagulation Profile: No results for input(s): INR, PROTIME in the last 168 hours. Cardiac Enzymes: No results for input(s): CKTOTAL, CKMB, CKMBINDEX, TROPONINI in the last 168 hours. BNP (last 3 results) No results for input(s): PROBNP in the last 8760 hours. HbA1C: No results for input(s): HGBA1C in  the last 72 hours. CBG: No results for input(s): GLUCAP in the last 168 hours. Lipid Profile: No results for input(s): CHOL, HDL, LDLCALC, TRIG, CHOLHDL, LDLDIRECT in the last 72 hours. Thyroid Function Tests: No results for input(s): TSH, T4TOTAL, FREET4, T3FREE, THYROIDAB in the last 72 hours. Anemia Panel: No results for input(s): VITAMINB12, FOLATE, FERRITIN, TIBC, IRON, RETICCTPCT in the last 72 hours. Urine analysis:    Component Value Date/Time   COLORURINE YELLOW 04/01/2019 1455   APPEARANCEUR CLEAR 04/01/2019 1455   LABSPEC 1.023 04/01/2019 1455   PHURINE 8.0 04/01/2019 1455   GLUCOSEU NEGATIVE 04/01/2019 1455   HGBUR NEGATIVE 04/01/2019 1455   BILIRUBINUR NEGATIVE 04/01/2019 1455   Rake 04/01/2019 1455   PROTEINUR 30 (A) 04/01/2019 1455   NITRITE NEGATIVE 04/01/2019 1455   LEUKOCYTESUR NEGATIVE 04/01/2019 1455     Peytin Dechert M.D. [facility] 04/02/2019, 2:05 PM  Pager: [phone] Between 7am to 7pm - call Pager - [phone]  After 7pm go to [email or web address] - password [email or web address]  Call night coverage person covering after 7pm

## 2019-04-02 NOTE — Progress Notes (Signed)
BP elevated this morning, treated as ordered, will continue to monitor.

## 2019-04-03 LAB — CBC
HCT: 32.7 % — ABNORMAL LOW (ref 36.0–46.0)
Hemoglobin: 10.4 g/dL — ABNORMAL LOW (ref 12.0–15.0)
MCH: 30.1 pg (ref 26.0–34.0)
MCHC: 31.8 g/dL (ref 30.0–36.0)
MCV: 94.8 fL (ref 80.0–100.0)
Platelets: 318 10*3/uL (ref 150–400)
RBC: 3.45 MIL/uL — ABNORMAL LOW (ref 3.87–5.11)
RDW: 13.7 % (ref 11.5–15.5)
WBC: 11 10*3/uL — ABNORMAL HIGH (ref 4.0–10.5)
nRBC: 0 % (ref 0.0–0.2)

## 2019-04-03 LAB — URINE CULTURE: Culture: 30000 — AB

## 2019-04-03 LAB — BASIC METABOLIC PANEL WITH GFR
Anion gap: 10 (ref 5–15)
BUN: 30 mg/dL — ABNORMAL HIGH (ref 8–23)
CO2: 26 mmol/L (ref 22–32)
Calcium: 9 mg/dL (ref 8.9–10.3)
Chloride: 99 mmol/L (ref 98–111)
Creatinine, Ser: 1.22 mg/dL — ABNORMAL HIGH (ref 0.44–1.00)
GFR calc Af Amer: 44 mL/min — ABNORMAL LOW
GFR calc non Af Amer: 38 mL/min — ABNORMAL LOW
Glucose, Bld: 100 mg/dL — ABNORMAL HIGH (ref 70–99)
Potassium: 3.8 mmol/L (ref 3.5–5.1)
Sodium: 135 mmol/L (ref 135–145)

## 2019-04-03 LAB — SARS CORONAVIRUS 2 (TAT 6-24 HRS): SARS Coronavirus 2: NEGATIVE

## 2019-04-03 NOTE — Progress Notes (Signed)
[facility]                                                                              Patient Demographics  [name] Love, is a 85 y.o. female, DOB - 1923-01-24, WUJ:[id number]  Admit date - 03/31/2019   Admitting Physician Briscoe Deutscher, MD  Outpatient Primary MD for the patient is Merlene Laughter, MD  Outpatient specialists:   LOS - 2  days   Medical records reviewed and are as summarized below:    Chief Complaint  Patient presents with   Hip Pain       Brief summary   Patient is a 85 year old female with history of hypertension, orthostatic hypotension, hypothyroidism, CKD stage III presented to ED with left hip pain after a mechanical fall.  Patient had gone to the mailbox, lost balance on her way back, fell onto her left hip and then experiencing severe pain. In ED, patient was found to be afebrile, O2 sats initially normal but later dropped into mid 80s on room air, severely hypertensive.  EKG showed sinus rhythm with anterior ST elevation appears similar to prior EKGs.  Chest x-ray negative.  On CT, left superior and inferior pubic rami fractures were noted.  Age-indeterminate T11 compression fracture.  Orthopedic surgery was consulted, seen by Dr. Luiz Blare, nonoperative  Patient was admitted for pain control, PT OT and may need skilled nursing facility. COVID 19 negative 10/22  Assessment & Plan    Principal Problem:   Pubic ramus fracture, left, closed, initial encounter (HCC) -Status post mechanical fall, CT showed minimally displaced superior pubic ramus fracture and nondisplaced inferior pubic ramusi fracture left side -Orthopedics consulted, seen by Dr. Luiz Blare, recommended no indication for surgical intervention.  - Weightbearing as tolerated ambulation with a walker.  Outpatient follow-up in 2 weeks - Cont scheduled Tylenol 1000 mg 3 times daily, avoid narcotics.  Patient had become somnolent after 1 dose of norco on 10/23.  Active  Problems: Mildly elevated troponin -Likely due to hypertensive urgency, no chest pain or shortness of breath.     Hypertensive urgency - BP uncontrolled, likely worse due to pain.  BP > 200 - Patient started on low-dose amlodipine, Florinef was held however patient's daughter very concerned that patient gets orthostatic hypotension if not given Florinef (ongoing chronic issue for last 3 years) - Avoid strict BP control due to history of orthostatic hypotension, target BP ~140/90 -Continue Norvasc, due to persistently high BP, Florinef has been held.  So far no acute issues of orthostatic hypotension.     Acute respiratory failure with hypoxia (HCC) -Unclear etiology, CT angiogram of the chest abdomen pelvis showed no aortic dissection or injury no pulmonary embolism mention.  No pulmonary edema or pleural effusion, pneumonia.  Possible atelectasis due to pain. -Continue incentive spirometry, O2 sats 94% on 2 L, wean O2 as tolerated    Hypothyroidism -Continue Synthroid 112 mcg daily    CKD (chronic kidney disease), stage III -Creatinine at baseline, follow    Orthostatic hypotension -Florinef was held in the light of severe hypertension, so far no acute issues.    Code Status: DNR DVT Prophylaxis:  SCD's Family Communication: Discussed all imaging results, lab results, explained to the patient's daughter in detail on the phone on 10/24  Disposition Plan: PT evaluation recommended skilled nursing facility.  Social work consulted.  Repeat Covid test ordered today 10/25.  Time Spent in minutes 25 minutes  Procedures:  CT chest abdomen pelvis  Consultants:   Orthopedics  Antimicrobials:   Anti-infectives (From admission, onward)   None         Medications  Scheduled Meds:  acetaminophen  1,000 mg Oral TID   amLODipine  5 mg Oral Daily   levothyroxine  112 mcg Oral Q0600   sodium chloride flush  3 mL Intravenous Q12H   sodium chloride flush  3 mL Intravenous  Q12H   Continuous Infusions:  sodium chloride     PRN Meds:.sodium chloride, acetaminophen **OR** acetaminophen, ketorolac, labetalol, ondansetron **OR** ondansetron (ZOFRAN) IV, polyethylene glycol, sodium chloride flush      Subjective:   [name] Love was seen and examined today.  BP somewhat better after starting Norvasc.  No acute issues with orthostatic hypotension.  Per patient pain is controlled when not moving.  No chest pain, dizziness shortness of breath.  Patient denies abdominal pain, N/V/D/C, new weakness, numbess, tingling.   Objective:   Vitals:   04/02/19 1427 04/02/19 2013 04/03/19 0153 04/03/19 0506  BP:  (!) 108/51 (!) 166/80 (!) 162/80  Pulse:  73 73 74  Resp:  16 18   Temp:  97.8 F (36.6 C)  97.9 F (36.6 C)  TempSrc:  Oral  Oral  SpO2: 96% 99% 95% 94%  Weight:      Height:        Intake/Output Summary (Last 24 hours) at 04/03/2019 1404 Last data filed at 04/03/2019 0506 Gross per 24 hour  Intake 100 ml  Output 600 ml  Net -500 ml     Wt Readings from Last 3 Encounters:  03/31/19 56.2 kg  04/14/17 66.2 kg  03/31/17 68 kg   Physical Exam  General: Alert and oriented x 3, NAD, hearing deficit  Eyes:  HEENT:  Atraumatic, normocephalic  Cardiovascular: S1 S2 clear, RRR. No pedal edema b/l  Respiratory: CTAB, no wheezing, rales or rhonchi  Gastrointestinal: Soft, nontender, nondistended, NBS  Ext: no pedal edema bilaterally  Neuro: no new deficits  Musculoskeletal: Per patient, hurts on lifting left leg  Skin: No rashes  Psych: Normal affect and demeanor, alert and oriented x3     Data Reviewed:  I have personally reviewed following labs and imaging studies  Micro Results Recent Results (from the past 240 hour(s))  SARS CORONAVIRUS 2 (TAT 6-24 HRS) Nasopharyngeal Nasopharyngeal Swab     Status: None   Collection Time: 03/31/19 10:57 PM   Specimen: Nasopharyngeal Swab  Result Value Ref Range Status   SARS Coronavirus 2  NEGATIVE NEGATIVE Final    Comment: (NOTE) SARS-CoV-2 target nucleic acids are NOT DETECTED. The SARS-CoV-2 RNA is generally detectable in upper and lower respiratory specimens during the acute phase of infection. Negative results do not preclude SARS-CoV-2 infection, do not rule out co-infections with other pathogens, and should not be used as the sole basis for treatment or other patient management decisions. Negative results must be combined with clinical observations, patient history, and epidemiological information. The expected result is Negative. Fact Sheet for Patients: HairSlick.no Fact Sheet for Healthcare Providers: quierodirigir.com This test is not yet approved or cleared by the [place] FDA and  has been authorized for detection and/or diagnosis  of SARS-CoV-2 by FDA under an Emergency Use Authorization (EUA). This EUA will remain  in effect (meaning this test can be used) for the duration of the COVID-19 declaration under Section 56 4(b)(1) of the Act, 21 U.S.C. section 360bbb-3(b)(1), unless the authorization is terminated or revoked sooner. Performed at [facility], [address], [place], [place] [address]   Urine Culture     Status: Abnormal   Collection Time: 04/01/19  2:55 PM   Specimen: Urine, Clean Catch  Result Value Ref Range Status   Specimen Description   Final    URINE, CLEAN CATCH Performed at [facility], [address], [place], [place] [address]    Special Requests   Final    NONE Performed at [facility], [address] [place], [place] [address]    Culture 30,000 COLONIES/mL KLEBSIELLA PNEUMONIAE (A)  Final   Report Status 04/03/2019 FINAL  Final   Organism ID, Bacteria KLEBSIELLA PNEUMONIAE (A)  Final      Susceptibility   Klebsiella pneumoniae - MIC*    AMPICILLIN >=32 RESISTANT Resistant     CEFAZOLIN <=4 SENSITIVE Sensitive      CEFTRIAXONE <=1 SENSITIVE Sensitive     CIPROFLOXACIN <=0.25 SENSITIVE Sensitive     GENTAMICIN <=1 SENSITIVE Sensitive     IMIPENEM <=0.25 SENSITIVE Sensitive     NITROFURANTOIN 128 RESISTANT Resistant     TRIMETH/SULFA <=20 SENSITIVE Sensitive     AMPICILLIN/SULBACTAM 4 SENSITIVE Sensitive     PIP/TAZO <=4 SENSITIVE Sensitive     Extended ESBL NEGATIVE Sensitive     * 30,000 COLONIES/mL KLEBSIELLA PNEUMONIAE    Radiology Reports Dg Chest 1 View  Result Date: 03/31/2019 CLINICAL DATA:  Fall EXAM: CHEST  1 VIEW COMPARISON:  03/17/2017 FINDINGS: Normal heart size. No pericardial effusion. Diffuse coarsened interstitial markings identified bilaterally. No superimposed pleural effusion, pulmonary edema or airspace consolidation. IMPRESSION: No active disease. Electronically Signed   By: Signa Kell M.D.   On: 03/31/2019 17:45   Dg Lumbar Spine Complete  Result Date: 03/31/2019 CLINICAL DATA:  Per EMS, patient from [facility] assisted living, c/o left hip pain after trip and fall. Pt c/o left knee pain. Best images obtained. EXAM: LUMBAR SPINE - COMPLETE 4+ VIEW COMPARISON:  08/02/2008 FINDINGS: There is marked degenerative change throughout the lumbar spine. Significant disc height loss and osteophytes throughout all levels. Mild scoliosis, mild mid lumbar scoliosis is stable. There has been wedge compression fracture of T11 since the prior study. This is only seen on the anterior and oblique images. The age of this fracture is not determined. There is atherosclerotic calcification of the abdominal aorta. Visualized bowel gas pattern is nonobstructive. IMPRESSION: 1. T11 compression fracture, new since the prior study and of indeterminate age. Given the history of fall, consider thoracic spine series. 2. Marked degenerative changes throughout the lumbar spine. Electronically Signed   By: Norva Pavlov M.D.   On: 03/31/2019 17:47   Ct Head Wo Contrast  Result Date: 03/31/2019 CLINICAL  DATA:  Per EMS, patient from [facility] assisted living, c/o left hip pain after trip and fall. Denies LOC. No deformity noted. Denies taking blood thinners. Answers questions appropriately EXAM: CT HEAD WITHOUT CONTRAST CT CERVICAL SPINE WITHOUT CONTRAST TECHNIQUE: Multidetector CT imaging of the head and cervical spine was performed following the standard protocol without intravenous contrast. Multiplanar CT image reconstructions of the cervical spine were also generated. COMPARISON:  03/31/2017 FINDINGS: CT HEAD FINDINGS Brain: There  is central and cortical atrophy. Periventricular white matter changes are consistent with small vessel disease. There is no intra or extra-axial fluid collection or mass lesion. The basilar cisterns and ventricles have a normal appearance. There is no CT evidence for acute infarction or hemorrhage. Vascular: There is atherosclerotic calcification of the internal carotid arteries. No hyperdense vessels. Skull: Normal. Negative for fracture or focal lesion. Sinuses/Orbits: Paranasal sinus mucosal thickening. No air-fluid levels or evidence for sinus wall fractures. Other: LEFT parietal scalp edema/hematoma without underlying fracture. CT CERVICAL SPINE FINDINGS Alignment: Normal. Mild convex RIGHT scoliosis Skull base and vertebrae: No acute fracture. No primary bone lesion or focal pathologic process. Soft tissues and spinal canal: No prevertebral fluid or swelling. No visible canal hematoma. Disc levels: Significant degenerative changes throughout the cervical spine, most notably at C4-5 and C5-6. Upper chest: Biapical pleuroparenchymal changes at the lung apices. Other: There is dense atherosclerotic calcification of the carotid arteries. IMPRESSION: 1. Atrophy and small vessel disease. 2. No evidence for acute intracranial abnormality. 3. LEFT parietal scalp edema/hematoma without underlying fracture. 4. Significant degenerative changes in the cervical spine. 5. No evidence for  acute cervical spine abnormality. 6. Dense atherosclerotic calcification of the common and internal carotid arteries. Electronically Signed   By: Norva Pavlov M.D.   On: 03/31/2019 18:06   Ct Cervical Spine Wo Contrast  Result Date: 03/31/2019 CLINICAL DATA:  Per EMS, patient from [facility] assisted living, c/o left hip pain after trip and fall. Denies LOC. No deformity noted. Denies taking blood thinners. Answers questions appropriately EXAM: CT HEAD WITHOUT CONTRAST CT CERVICAL SPINE WITHOUT CONTRAST TECHNIQUE: Multidetector CT imaging of the head and cervical spine was performed following the standard protocol without intravenous contrast. Multiplanar CT image reconstructions of the cervical spine were also generated. COMPARISON:  03/31/2017 FINDINGS: CT HEAD FINDINGS Brain: There is central and cortical atrophy. Periventricular white matter changes are consistent with small vessel disease. There is no intra or extra-axial fluid collection or mass lesion. The basilar cisterns and ventricles have a normal appearance. There is no CT evidence for acute infarction or hemorrhage. Vascular: There is atherosclerotic calcification of the internal carotid arteries. No hyperdense vessels. Skull: Normal. Negative for fracture or focal lesion. Sinuses/Orbits: Paranasal sinus mucosal thickening. No air-fluid levels or evidence for sinus wall fractures. Other: LEFT parietal scalp edema/hematoma without underlying fracture. CT CERVICAL SPINE FINDINGS Alignment: Normal. Mild convex RIGHT scoliosis Skull base and vertebrae: No acute fracture. No primary bone lesion or focal pathologic process. Soft tissues and spinal canal: No prevertebral fluid or swelling. No visible canal hematoma. Disc levels: Significant degenerative changes throughout the cervical spine, most notably at C4-5 and C5-6. Upper chest: Biapical pleuroparenchymal changes at the lung apices. Other: There is dense atherosclerotic calcification of the  carotid arteries. IMPRESSION: 1. Atrophy and small vessel disease. 2. No evidence for acute intracranial abnormality. 3. LEFT parietal scalp edema/hematoma without underlying fracture. 4. Significant degenerative changes in the cervical spine. 5. No evidence for acute cervical spine abnormality. 6. Dense atherosclerotic calcification of the common and internal carotid arteries. Electronically Signed   By: Norva Pavlov M.D.   On: 03/31/2019 18:06   Ct T-spine No Charge  Result Date: 03/31/2019 CLINICAL DATA:  Blood fall, back pain, hypertension EXAM: CT LUMBAR SPINE WITHOUT CONTRAST TECHNIQUE: Multidetector CT imaging of the lumbar spine was performed without intravenous contrast administration. Multiplanar CT image reconstructions were also generated. COMPARISON:  None. FINDINGS: Thoracic spine: Alignment: Normal Vertebrae: There is a superior compression  deformity of the T11 vertebral body with approximately 75% loss in vertebral body height. There is buckling of the anterior superior and posterosuperior cortex is. No retropulsion of fragments is seen. Paraspinal and other soft tissues: The paraspinal soft tissues and visualized retroperitoneal structures are unremarkable. The sacroiliac joints are intact. Biapical scarring/atelectasis is seen. Disc levels: Mild disc height loss with facet arthrosis is seen throughout the thoracic spine. At T10-T11 there is buckling of the superior cortex of T11 which causes mild canal narrowing. There is also mild neural foraminal narrowing. Lumbar spine: Segmentation: There are 5 non-rib bearing lumbar type vertebral bodies with the last intervertebral disc space labeled as L5-S1. Alignment: There is a mild S-shaped scoliotic curvature of the lumbar spine. Slight lateral listhesis of L2 on L3 and L3 on L4 seen. Vertebrae: The vertebral body heights are well maintained. No fracture, malalignment, or pathologic osseous lesions seen. Paraspinal and other soft tissues: The  paraspinal soft tissues and visualized retroperitoneal structures are unremarkable. The sacroiliac joints are intact. Scattered dense aortic atherosclerosis is noted. Disc levels: There is disc height loss with facet arthrosis and vacuum phenomenon seen throughout the lumbar spine. This is most notable at L2-L3 and L3-L4 with severe left neural foraminal narrowing and moderate central canal stenosis. IMPRESSION: 1. Age indeterminate superior compression deformity of the T11 vertebral body with 75% loss in vertebral body height. This is new since prior exam of 2018. There is buckling of the superior cortex. However no retropulsion of fragments. 2. Mild S-shaped scoliotic curvature of the lumbar spine. 3. Lumbar spine spondylosis most notable at L2-L3 and L3-L4 with severe left neural foraminal narrowing and moderate central canal stenosis. Electronically Signed   By: Jonna ClarkBindu  Avutu M.D.   On: 03/31/2019 20:06   Ct L-spine No Charge  Result Date: 03/31/2019 CLINICAL DATA:  Blood fall, back pain, hypertension EXAM: CT LUMBAR SPINE WITHOUT CONTRAST TECHNIQUE: Multidetector CT imaging of the lumbar spine was performed without intravenous contrast administration. Multiplanar CT image reconstructions were also generated. COMPARISON:  None. FINDINGS: Thoracic spine: Alignment: Normal Vertebrae: There is a superior compression deformity of the T11 vertebral body with approximately 75% loss in vertebral body height. There is buckling of the anterior superior and posterosuperior cortex is. No retropulsion of fragments is seen. Paraspinal and other soft tissues: The paraspinal soft tissues and visualized retroperitoneal structures are unremarkable. The sacroiliac joints are intact. Biapical scarring/atelectasis is seen. Disc levels: Mild disc height loss with facet arthrosis is seen throughout the thoracic spine. At T10-T11 there is buckling of the superior cortex of T11 which causes mild canal narrowing. There is also mild  neural foraminal narrowing. Lumbar spine: Segmentation: There are 5 non-rib bearing lumbar type vertebral bodies with the last intervertebral disc space labeled as L5-S1. Alignment: There is a mild S-shaped scoliotic curvature of the lumbar spine. Slight lateral listhesis of L2 on L3 and L3 on L4 seen. Vertebrae: The vertebral body heights are well maintained. No fracture, malalignment, or pathologic osseous lesions seen. Paraspinal and other soft tissues: The paraspinal soft tissues and visualized retroperitoneal structures are unremarkable. The sacroiliac joints are intact. Scattered dense aortic atherosclerosis is noted. Disc levels: There is disc height loss with facet arthrosis and vacuum phenomenon seen throughout the lumbar spine. This is most notable at L2-L3 and L3-L4 with severe left neural foraminal narrowing and moderate central canal stenosis. IMPRESSION: 1. Age indeterminate superior compression deformity of the T11 vertebral body with 75% loss in vertebral body height. This is new  since prior exam of 2018. There is buckling of the superior cortex. However no retropulsion of fragments. 2. Mild S-shaped scoliotic curvature of the lumbar spine. 3. Lumbar spine spondylosis most notable at L2-L3 and L3-L4 with severe left neural foraminal narrowing and moderate central canal stenosis. Electronically Signed   By: Jonna Clark M.D.   On: 03/31/2019 20:06   Dg Knee Complete 4 Views Left  Result Date: 03/31/2019 CLINICAL DATA:  Fall EXAM: LEFT KNEE - COMPLETE 4+ VIEW COMPARISON:  None FINDINGS: Osseous demineralization. Extensive chondrocalcinosis. Minimal joint space narrowing. No acute fracture, dislocation, or bone destruction. Scattered atherosclerotic calcifications. No knee joint effusion. IMPRESSION: Osseous demineralization with minimal degenerative changes and scattered chondrocalcinosis question CPPD. No acute osseous abnormalities. Electronically Signed   By: Ulyses Southward M.D.   On: 03/31/2019  17:47   Ct Angio Chest/abd/pel For Dissection W And/or Wo Contrast  Result Date: 03/31/2019 CLINICAL DATA:  85 year old with back pain after trip and fall. Minor trauma. Hypertension. Rule out dissection. EXAM: CT ANGIOGRAPHY CHEST, ABDOMEN AND PELVIS TECHNIQUE: Multidetector CT imaging through the chest, abdomen and pelvis was performed using the standard protocol during bolus administration of intravenous contrast. Multiplanar reconstructed images and MIPs were obtained and reviewed to evaluate the vascular anatomy. CONTRAST:  OMNIPAQUE IOHEXOL 350 MG/ML SOLN COMPARISON:  Chest radiograph earlier this day. FINDINGS: CTA CHEST FINDINGS Cardiovascular: Noncontrast CT demonstrates no aortic hematoma. No aneurysm. Moderate atherosclerosis of the thoracic aorta and branch vessels. Post contrast there is no evidence of acute aortic injury, dissection, vasculitis or acute aortic syndrome. No filling defects in the central most pulmonary arteries to the lobar level. There are coronary artery calcifications. Borderline cardiomegaly. No pericardial effusion. Mediastinum/Nodes: Calcified mediastinal lymph nodes consistent with prior granulomatous disease. No noncalcified adenopathy. No aortic hematoma or hemorrhage. No pneumomediastinum. No esophageal wall thickening. No thyroid nodule. Lungs/Pleura: No pneumothorax. Biapical pleuroparenchymal scarring. Mild dependent atelectasis. Small intraparenchymal cyst in the left lower lobe specific clinical significance. No pulmonary edema or pleural effusion. Calcified granuloma in the right lower lobe. Trachea and mainstem bronchi are patent, mild motion artifact limitations. Musculoskeletal: Thoracic spine better assessed on dedicated thoracic spine reformats. T11 compression fracture. No acute fracture of the sternum, ribs, included clavicles or shoulder girdles. Review of the MIP images confirms the above findings. CTA ABDOMEN AND PELVIS FINDINGS VASCULAR Aorta:  Atherosclerotic and tortuous. No dissection or evidence of acute injury. No frank aneurysmal dilatation. Celiac: No dissection or acute injury. Calcified plaque at the origin causes approximately 50% luminal stenosis. SMA: No dissection or acute injury. Calcified plaque at the origin causes approximately 50% luminal narrowing. Branch vessels are patent. Renals: Plaque at the origin of both renal arteries. Approximately 50% luminal narrowing the the origin of both renal arteries, left greater than right. IMA: Patent without evidence of aneurysm, dissection, vasculitis or significant stenosis. Inflow: Atherosclerotic and tortuous. No dissection or acute abnormality. Veins: No obvious venous abnormality within the limitations of this arterial phase study. Review of the MIP images confirms the above findings. NON-VASCULAR Hepatobiliary: Arterial phase imaging limits evaluation for acute injury. No evidence of focal hepatic abnormality. No perihepatic hematoma. Gallbladder physiologically distended, no calcified stone. No biliary dilatation. Pancreas: No ductal dilatation or inflammation. Spleen: Normal in size and arterial phase enhancement. Mild motion artifact limits detailed assessment. No perisplenic hematoma. Adrenals/Urinary Tract: No renal or adrenal hemorrhage. No hydronephrosis. No perinephric edema. Distended urinary bladder without wall thickening. Stomach/Bowel: Colonic diverticulosis, advanced in the descending and sigmoid colon. Motion  artifact limits detailed bowel assessment. No evidence of bowel inflammation or obstruction. No mesenteric hematoma. Lymphatic: No enlarged lymph nodes in the abdomen or pelvis. Reproductive: Uterus and bilateral adnexa are unremarkable. Other: No free air or free fluid. No confluent body wall contusion. Musculoskeletal: Minimally displaced left superior pubic ramus fracture extending to the pubic body. Nondisplaced left inferior pubic ramus fracture. Left hip joint remains  intact. Lumbar spine better assessed on dedicated spine reformats. There are enthesopathic changes of the ischial tuberosities. Review of the MIP images confirms the above findings. IMPRESSION: 1. No aortic dissection or acute aortic injury. Diffuse aortic tortuosity and atherosclerosis. 2. Minimally displaced left superior and nondisplaced inferior pubic ramus fractures. 3. No additional acute traumatic injury to the chest, abdomen, or pelvis, allowing for limitations related to arterial phase imaging. 4. Colonic diverticulosis. 5. Thoracic and lumbar spine assessed on separately reported spine reformats. Aortic Atherosclerosis (ICD10-I70.0). Electronically Signed   By: Narda Rutherford M.D.   On: 03/31/2019 20:07   Dg Hip Unilat W Or Wo Pelvis 2-3 Views Left  Result Date: 03/31/2019 CLINICAL DATA:  Left hip pain after fall. EXAM: DG HIP (WITH OR WITHOUT PELVIS) 2-3V LEFT COMPARISON:  None. FINDINGS: Mild diffuse osteopenia. Degenerative changes identified within the lumbar spine and both hips. No acute fracture or dislocation. IMPRESSION: 1. No acute findings. 2. Osteopenia and degenerative changes. Electronically Signed   By: Signa Kell M.D.   On: 03/31/2019 17:47    Lab Data:  CBC: Recent Labs  Lab 03/31/19 1759 04/01/19 0520 04/02/19 0529 04/03/19 0503  WBC 12.9* 13.5* 14.2* 11.0*  NEUTROABS 10.9* 11.9*  --   --   HGB 12.2 10.9* 11.0* 10.4*  HCT 39.0 34.4* 34.9* 32.7*  MCV 94.7 94.5 94.3 94.8  PLT 424* 371 332 318   Basic Metabolic Panel: Recent Labs  Lab 03/31/19 1759 04/01/19 0520 04/02/19 0529 04/03/19 0503  NA 140 140 136 135  K 3.8 4.0 3.8 3.8  CL 100 101 98 99  CO2 26 29 25 26   GLUCOSE 111* 116* 105* 100*  BUN 25* 22 27* 30*  CREATININE 1.05* 1.09* 1.19* 1.22*  CALCIUM 9.8 9.5 9.4 9.0   GFR: Estimated Creatinine Clearance: 25.6 mL/min (A) (by C-G formula based on SCr of 1.22 mg/dL (H)). Liver Function Tests: No results for input(s): AST, ALT, ALKPHOS,  BILITOT, PROT, ALBUMIN in the last 168 hours. No results for input(s): LIPASE, AMYLASE in the last 168 hours. No results for input(s): AMMONIA in the last 168 hours. Coagulation Profile: No results for input(s): INR, PROTIME in the last 168 hours. Cardiac Enzymes: No results for input(s): CKTOTAL, CKMB, CKMBINDEX, TROPONINI in the last 168 hours. BNP (last 3 results) No results for input(s): PROBNP in the last 8760 hours. HbA1C: No results for input(s): HGBA1C in the last 72 hours. CBG: No results for input(s): GLUCAP in the last 168 hours. Lipid Profile: No results for input(s): CHOL, HDL, LDLCALC, TRIG, CHOLHDL, LDLDIRECT in the last 72 hours. Thyroid Function Tests: No results for input(s): TSH, T4TOTAL, FREET4, T3FREE, THYROIDAB in the last 72 hours. Anemia Panel: No results for input(s): VITAMINB12, FOLATE, FERRITIN, TIBC, IRON, RETICCTPCT in the last 72 hours. Urine analysis:    Component Value Date/Time   COLORURINE YELLOW 04/01/2019 1455   APPEARANCEUR CLEAR 04/01/2019 1455   LABSPEC 1.023 04/01/2019 1455   PHURINE 8.0 04/01/2019 1455   GLUCOSEU NEGATIVE 04/01/2019 1455   HGBUR NEGATIVE 04/01/2019 1455   BILIRUBINUR NEGATIVE 04/01/2019 1455   KETONESUR  NEGATIVE 04/01/2019 1455   PROTEINUR 30 (A) 04/01/2019 1455   NITRITE NEGATIVE 04/01/2019 1455   LEUKOCYTESUR NEGATIVE 04/01/2019 1455     Jeanna Giuffre M.D. [facility] 04/03/2019, 2:04 PM  Pager: [phone] Between 7am to 7pm - call Pager - [phone]  After 7pm go to [email or web address] - password [email or web address]  Call night coverage person covering after 7pm

## 2019-04-03 NOTE — TOC Progression Note (Signed)
Transition of Care [facility]) - Progression Note    Patient Details  Name: [name] Love MRN: [id number] Date of Birth: [birth year]  [facility] CM/SW [place], LCSW Phone Number: 04/03/2019, 11:54 AM  Clinical Narrative:   CSW following patient for placement. CSW spoke with patient daughter via phone and she is agreeable for patient to go to [facility] for rehab. Patient will need authorization through insurance prior to discharge.    Expected Discharge Plan: [place] Barriers to Discharge: Continued Medical Work up  Expected Discharge Plan and Services Expected Discharge Plan: [place]   Discharge Planning Services: CM Consult   Living arrangements for the past 2 months: [place]                                       Social Determinants of Health (SDOH) Interventions    Readmission Risk Interventions No flowsheet data found.

## 2019-04-03 NOTE — [place] FL2 (Signed)
[place] MEDICAID FL2 LEVEL OF CARE SCREENING TOOL     IDENTIFICATION  Patient Name: [name] Love Birthdate: 06-Oct-1922 Sex: female Admission Date (Current Location): 03/31/2019  [facility] and [place] Number:  Herbalist and Address:  [facility],  [place], [place]      Provider Number: [id number]  Attending Physician Name and Address:  Mendel Corning, MD  Relative Name and Phone Number:  Lamar Sprinkles,  [phone]    Current Level of Care: Hospital Recommended Level of Care: [place] Prior Approval Number:    Date Approved/Denied:   PASRR Number: [id number]  Discharge Plan: SNF    Current Diagnoses: Patient Active Problem List   Diagnosis Date Noted  . Pubic ramus fracture, left, closed, initial encounter ([place]) 03/31/2019  . Elevated troponin 03/31/2019  . Hypertensive urgency 03/31/2019  . Acute respiratory failure with hypoxia ([place]) 03/31/2019  . Hypothyroidism 03/31/2019  . CKD (chronic kidney disease), stage III 03/31/2019  . Orthostatic hypotension 03/31/2019  . Carotid stenosis 03/09/2014  . Aftercare following surgery of the circulatory system 03/09/2014  . Aftercare following surgery of the circulatory system, [place] 03/08/2013  . Occlusion and stenosis of carotid artery without mention of cerebral infarction 02/04/2012    Orientation RESPIRATION BLADDER Height & Weight     Time, Self, Situation, Place  O2(2L) Incontinent, External catheter Weight: 123 lb 14.4 oz (56.2 kg) Height:  5\' 5"  (165.1 cm)  BEHAVIORAL SYMPTOMS/MOOD NEUROLOGICAL BOWEL NUTRITION STATUS      Continent Diet(heart healthy)  AMBULATORY STATUS COMMUNICATION OF NEEDS Skin   Limited Assist Verbally Bruising(from the fall)                       Personal Care Assistance Level of Assistance  Bathing, Feeding, Dressing Bathing Assistance: Limited assistance Feeding assistance: Independent Dressing Assistance: Limited  assistance     Functional Limitations Info  Sight, Hearing, Speech Sight Info: Adequate Hearing Info: Adequate Speech Info: Adequate    SPECIAL CARE FACTORS FREQUENCY  PT (By licensed PT), OT (By licensed OT)     PT Frequency: 5x wk OT Frequency: 5x wk            Contractures Contractures Info: Not present    Additional Factors Info  Code Status, Allergies Code Status Info: DNR Allergies Info: No known allergies           Current Medications (04/03/2019):  This is the current hospital active medication list Current Facility-Administered Medications  Medication Dose Route Frequency Provider Last Rate Last Dose  . 0.9 %  sodium chloride infusion  250 mL Intravenous PRN Opyd, Ilene Qua, MD      . acetaminophen (TYLENOL) tablet 650 mg  650 mg Oral Q6H PRN Opyd, Ilene Qua, MD       Or  . acetaminophen (TYLENOL) suppository 650 mg  650 mg Rectal Q6H PRN Opyd, Ilene Qua, MD      . acetaminophen (TYLENOL) tablet 1,000 mg  1,000 mg Oral TID Rai, Ripudeep K, MD   1,000 mg at 04/03/19 1002  . amLODipine (NORVASC) tablet 5 mg  5 mg Oral Daily Rai, Ripudeep K, MD   5 mg at 04/03/19 1002  . ketorolac (TORADOL) 15 MG/ML injection 15 mg  15 mg Intravenous Q6H PRN Rai, Ripudeep K, MD      . labetalol (NORMODYNE) injection 10 mg  10 mg Intravenous Q2H PRN Opyd, Ilene Qua, MD   10 mg at  04/02/19 2725  . levothyroxine (SYNTHROID) tablet 112 mcg  112 mcg Oral Q0600 Briscoe Deutscher, MD   112 mcg at 04/03/19 3664  . ondansetron (ZOFRAN) tablet 4 mg  4 mg Oral Q6H PRN Opyd, Lavone Neri, MD       Or  . ondansetron (ZOFRAN) injection 4 mg  4 mg Intravenous Q6H PRN Opyd, Lavone Neri, MD      . polyethylene glycol (MIRALAX / GLYCOLAX) packet 17 g  17 g Oral Daily PRN Opyd, Lavone Neri, MD      . sodium chloride flush (NS) 0.9 % injection 3 mL  3 mL Intravenous Q12H Opyd, Lavone Neri, MD   3 mL at 04/02/19 0640  . sodium chloride flush (NS) 0.9 % injection 3 mL  3 mL Intravenous Q12H Opyd, Lavone Neri, MD    3 mL at 04/03/19 1002  . sodium chloride flush (NS) 0.9 % injection 3 mL  3 mL Intravenous PRN Opyd, Lavone Neri, MD         Discharge Medications: Please see discharge summary for a list of discharge medications.  Relevant Imaging Results:  Relevant Lab Results:   Additional Information SS# [id number]  Althea Charon, LCSW

## 2019-04-04 DIAGNOSIS — N183 Chronic kidney disease, stage 3 unspecified: Secondary | ICD-10-CM | POA: Diagnosis not present

## 2019-04-04 DIAGNOSIS — K5900 Constipation, unspecified: Secondary | ICD-10-CM | POA: Diagnosis not present

## 2019-04-04 DIAGNOSIS — E039 Hypothyroidism, unspecified: Secondary | ICD-10-CM | POA: Diagnosis not present

## 2019-04-04 DIAGNOSIS — R0902 Hypoxemia: Secondary | ICD-10-CM | POA: Diagnosis not present

## 2019-04-04 DIAGNOSIS — R296 Repeated falls: Secondary | ICD-10-CM | POA: Diagnosis not present

## 2019-04-04 DIAGNOSIS — I959 Hypotension, unspecified: Secondary | ICD-10-CM | POA: Diagnosis not present

## 2019-04-04 DIAGNOSIS — M2550 Pain in unspecified joint: Secondary | ICD-10-CM | POA: Diagnosis not present

## 2019-04-04 DIAGNOSIS — S32592K Other specified fracture of left pubis, subsequent encounter for fracture with nonunion: Secondary | ICD-10-CM | POA: Diagnosis not present

## 2019-04-04 DIAGNOSIS — S3289XA Fracture of other parts of pelvis, initial encounter for closed fracture: Secondary | ICD-10-CM | POA: Diagnosis not present

## 2019-04-04 DIAGNOSIS — R05 Cough: Secondary | ICD-10-CM | POA: Diagnosis not present

## 2019-04-04 DIAGNOSIS — Z7401 Bed confinement status: Secondary | ICD-10-CM | POA: Diagnosis not present

## 2019-04-04 DIAGNOSIS — M1712 Unilateral primary osteoarthritis, left knee: Secondary | ICD-10-CM | POA: Diagnosis not present

## 2019-04-04 DIAGNOSIS — D72829 Elevated white blood cell count, unspecified: Secondary | ICD-10-CM | POA: Diagnosis not present

## 2019-04-04 DIAGNOSIS — R778 Other specified abnormalities of plasma proteins: Secondary | ICD-10-CM | POA: Diagnosis not present

## 2019-04-04 DIAGNOSIS — F419 Anxiety disorder, unspecified: Secondary | ICD-10-CM | POA: Diagnosis not present

## 2019-04-04 DIAGNOSIS — I10 Essential (primary) hypertension: Secondary | ICD-10-CM | POA: Diagnosis not present

## 2019-04-04 DIAGNOSIS — Z111 Encounter for screening for respiratory tuberculosis: Secondary | ICD-10-CM | POA: Diagnosis not present

## 2019-04-04 DIAGNOSIS — M800AXD Age-related osteoporosis with current pathological fracture, other site, subsequent encounter for fracture with routine healing: Secondary | ICD-10-CM | POA: Diagnosis not present

## 2019-04-04 DIAGNOSIS — G8911 Acute pain due to trauma: Secondary | ICD-10-CM | POA: Diagnosis not present

## 2019-04-04 DIAGNOSIS — E559 Vitamin D deficiency, unspecified: Secondary | ICD-10-CM | POA: Diagnosis not present

## 2019-04-04 DIAGNOSIS — M6281 Muscle weakness (generalized): Secondary | ICD-10-CM | POA: Diagnosis not present

## 2019-04-04 DIAGNOSIS — J9601 Acute respiratory failure with hypoxia: Secondary | ICD-10-CM | POA: Diagnosis not present

## 2019-04-04 DIAGNOSIS — G4700 Insomnia, unspecified: Secondary | ICD-10-CM | POA: Diagnosis not present

## 2019-04-04 DIAGNOSIS — R2689 Other abnormalities of gait and mobility: Secondary | ICD-10-CM | POA: Diagnosis not present

## 2019-04-04 DIAGNOSIS — M84454D Pathological fracture, pelvis, subsequent encounter for fracture with routine healing: Secondary | ICD-10-CM | POA: Diagnosis not present

## 2019-04-04 DIAGNOSIS — Z1159 Encounter for screening for other viral diseases: Secondary | ICD-10-CM | POA: Diagnosis not present

## 2019-04-04 DIAGNOSIS — S32502D Unspecified fracture of left pubis, subsequent encounter for fracture with routine healing: Secondary | ICD-10-CM | POA: Diagnosis not present

## 2019-04-04 DIAGNOSIS — S32592A Other specified fracture of left pubis, initial encounter for closed fracture: Secondary | ICD-10-CM | POA: Diagnosis not present

## 2019-04-04 DIAGNOSIS — R52 Pain, unspecified: Secondary | ICD-10-CM | POA: Diagnosis not present

## 2019-04-04 DIAGNOSIS — Z9181 History of falling: Secondary | ICD-10-CM | POA: Diagnosis not present

## 2019-04-04 DIAGNOSIS — M25552 Pain in left hip: Secondary | ICD-10-CM | POA: Diagnosis not present

## 2019-04-04 LAB — CBC
HCT: 36.5 % (ref 36.0–46.0)
Hemoglobin: 11.5 g/dL — ABNORMAL LOW (ref 12.0–15.0)
MCH: 30.1 pg (ref 26.0–34.0)
MCHC: 31.5 g/dL (ref 30.0–36.0)
MCV: 95.5 fL (ref 80.0–100.0)
Platelets: 332 10*3/uL (ref 150–400)
RBC: 3.82 MIL/uL — ABNORMAL LOW (ref 3.87–5.11)
RDW: 13.5 % (ref 11.5–15.5)
WBC: 9.7 10*3/uL (ref 4.0–10.5)
nRBC: 0 % (ref 0.0–0.2)

## 2019-04-04 LAB — BASIC METABOLIC PANEL
Anion gap: 9 (ref 5–15)
BUN: 27 mg/dL — ABNORMAL HIGH (ref 8–23)
CO2: 27 mmol/L (ref 22–32)
Calcium: 9.1 mg/dL (ref 8.9–10.3)
Chloride: 103 mmol/L (ref 98–111)
Creatinine, Ser: 1.13 mg/dL — ABNORMAL HIGH (ref 0.44–1.00)
GFR calc Af Amer: 49 mL/min — ABNORMAL LOW (ref >60)
GFR calc non Af Amer: 42 mL/min — ABNORMAL LOW (ref >60)
Glucose, Bld: 93 mg/dL (ref 70–99)
Potassium: 3.6 mmol/L (ref 3.5–5.1)
Sodium: 139 mmol/L (ref 135–145)

## 2019-04-04 MED ORDER — POLYETHYLENE GLYCOL 3350 17 G PO PACK: 17.0000 g | PACK | Freq: Every day | ORAL | 0 refills | Status: AC | PRN

## 2019-04-04 MED ORDER — METOPROLOL TARTRATE 25 MG PO TABS: 25.0000 mg | ORAL_TABLET | Freq: Two times a day (BID) | ORAL | 1 refills | Status: AC

## 2019-04-04 MED ORDER — HYDRALAZINE HCL 25 MG PO TABS: 25.0000 mg | ORAL_TABLET | Freq: Two times a day (BID) | ORAL | Status: DC

## 2019-04-04 MED ORDER — HYDRALAZINE HCL 10 MG PO TABS
10.0000 mg | ORAL_TABLET | Freq: Two times a day (BID) | ORAL | Status: DC
  Administered 2019-04-04: 10 mg via ORAL
  Filled 2019-04-04: qty 1

## 2019-04-04 MED ORDER — METOPROLOL TARTRATE 25 MG PO TABS
25.0000 mg | ORAL_TABLET | Freq: Two times a day (BID) | ORAL | Status: DC
  Administered 2019-04-04: 25 mg via ORAL
  Filled 2019-04-04: qty 1

## 2019-04-04 NOTE — Progress Notes (Signed)
Orthostatic VS= lying 132/65 hr 66, sitting 109/59 hr 66, standing 112/57 hr 68 no c/o dizziness.

## 2019-04-04 NOTE — Progress Notes (Signed)
PT Cancellation Note  Patient Details Name: [name] Love MRN: [id number] DOB: [birth year]   Cancelled Treatment:    Reason Eval/Treat Not Completed: Fatigue/lethargy limiting ability to participate;Other (comment) - PT checked on pt at 1130 and deferred by NT due to pt fatigue. PT checked again around 1510, and pt is d/cing to SNF today so RN defers.   Julien Girt, PT Acute Rehabilitation Services Pager [phone]  Office [phone]    Roxine Caddy D Elonda Husky 04/04/2019, 3:13 PM

## 2019-04-04 NOTE — Discharge Summary (Addendum)
Physician Discharge Summary  [name] Love ZOX:[id number] DOB: Nov 27, 1922 DOA: 03/31/2019  PCP: Merlene Laughter, MD  Admit date: 03/31/2019 Discharge date: 04/04/2019 Consultations: Orthopedics Admitted From: home Disposition: SNF  Discharge Diagnoses:  Principal Problem:   Pubic ramus fracture, left, closed, initial encounter (HCC) Active Problems:   Elevated troponin   Hypertensive urgency   Acute respiratory failure with hypoxia (HCC)   Hypothyroidism   CKD (chronic kidney disease), stage III   Orthostatic hypotension   Hospital Course Summary: 85 year old female with history of hypertension, orthostatic hypotension on florinef, hypothyroidism, CKD stage III presented to ED with left hip pain after a mechanical fall. Patient had gone to the mailbox, lost balance on her way back, fell onto her left hip.  ED course: afebrile, O2 sats initially normal but later dropped into mid 80s on room air, severely hypertensive.EKG showed sinus rhythm with anterior ST elevation appears similar to prior EKGs.Chest x-ray negative. On CT,age-indeterminate left superior and inferior pubic rami fractures were noted along with T11 compression fracture. Orthopedic surgery was consulted, seen by Dr. Luiz Blare, recommended nonoperative Mx. Hospital course: Admitted to [facility] service for pain control, medical management of fractures, uncontrolled HTN and acute hypoxic respiratory failure requiring supplemental 02. Patient started on Norvasc for BP control initially, followed Ortho recommendations for pain control, weightbearing as tolerated and ambulation with a walker. Will need outpatient follow-up in 2 weeks.Patient had become somnolent after 1 dose of norcoon 10/23- now on scheduled Tylenol 1000 mg 3 times daily, avoiding narcotics. Florinef held. Unclear etiology for hypoxia. CT angiogram of the chest abdomen pelvis showed no aortic dissection, no pulmonary embolism/ edema or pleural effusion/ pneumonia. Patient  improved with IS for possible atelectasis, supplemental 02 which was at 2lits-tapered to off on 10/25-currently saturating well on RA.  PT OT recommended skilled nursing facility. Repeat COVID sent 10/25- negative. Patient has bed at SNF today and will be discharged antihypertensives --Norvasc changed to metoprolol given concern for worsening orthostasis with calcium channel blockers. Would avoid other venodilators like nitrates/clonidine as well. Can utilize hydralazine if needed. Her repeat orthostatics BP reading prior to discharge are: Supine: 139/80, Sitting: 110/80,Standing: 112/90. She is advised support stockings and f/u BP readings at SNF as her BP might have been transiently elevated in the setting of pain.    Discharge Exam:  Vitals:   04/04/19 0605 04/04/19 1100  BP: (!) 185/66   Pulse: 79   Resp: 16   Temp: 98.3 F (36.8 C) 97.6 F (36.4 C)  SpO2: 96% 94%   Vitals:   04/03/19 1330 04/03/19 2046 04/04/19 0605 04/04/19 1100  BP: (!) 154/75 (!) 179/77 (!) 185/66   Pulse: 71 72 79   Resp: Temp: 98 F (36.7 C) 98.5 F (36.9 C) 98.3 F (36.8 C) 97.6 F (36.4 C)  TempSrc: Oral Oral Oral Oral  SpO2: 97% 97% 96% 94%  Weight:      Height:        General: Pt is alert, awake, not in acute distress Cardiovascular: RRR, S1/S2 +, no rubs, no gallops Respiratory: CTA bilaterally, no wheezing, no rhonchi Abdominal: Soft, NT, ND, bowel sounds + Extremities: no edema, no cyanosis  Discharge Condition:Stable CODE STATUS: DNR ACTIVITY: WBAT, ambulation with a walker encouraged by orthopedics Diet recommendation: low salt (can change to regular diet if normotensive at [facility]) Recommendations for Outpatient Follow-up:  1. Follow up with PCP: AT SNF 2. Follow up with consultants: Orthopedics Dr Jodi Geralds in 2 weeks  3. Please obtain follow up labs including: CBC/BMP in 5 days   Equipment/Devices upon discharge: none   Discharge Instructions:  Discharge Instructions     Call MD for:  difficulty breathing, headache or visual disturbances   Complete by: As directed    Call MD for:  extreme fatigue   Complete by: As directed    Call MD for:  persistant dizziness or light-headedness   Complete by: As directed    Call MD for:  severe uncontrolled pain   Complete by: As directed    Call MD for:  temperature >100.4   Complete by: As directed    Diet - low sodium heart healthy   Complete by: As directed    Increase activity slowly   Complete by: As directed      Allergies as of 04/04/2019   No Known Allergies     Medication List    STOP taking these medications   fludrocortisone 0.1 MG tablet Commonly known as: FLORINEF   potassium chloride 10 MEQ CR capsule Commonly known as: MICRO-K     TAKE these medications   acetaminophen 500 MG tablet Commonly known as: TYLENOL Take 500 mg by mouth every 6 (six) hours as needed for mild pain, moderate pain, fever or headache.   aspirin 81 MG tablet Take 81 mg by mouth daily.   FLORASTOR PO Take 1 tablet by mouth daily.   levothyroxine 112 MCG tablet Commonly known as: SYNTHROID Take 112 mcg by mouth daily before breakfast.   Melatonin 3 MG Tabs Take 3 mg by mouth at bedtime.   metoprolol tartrate 25 MG tablet Commonly known as: LOPRESSOR Take 1 tablet (25 mg total) by mouth 2 (two) times daily. Hold for supine SBP<130, HR <60   multivitamin with minerals Tabs tablet Take 1 tablet by mouth daily.   OSCAL 500/200 D-3 PO Take 1 tablet by mouth daily.   polyethylene glycol 17 g packet Commonly known as: MIRALAX / GLYCOLAX Take 17 g by mouth daily as needed for mild constipation.       No Known Allergies    The results of significant diagnostics from this hospitalization (including imaging, microbiology, ancillary and laboratory) are listed below for reference.    Labs: BNP (last 3 results) No results for input(s): BNP in the last 8760 hours. Basic Metabolic Panel: Recent Labs   Lab 03/31/19 1759 04/01/19 0520 04/02/19 0529 04/03/19 0503 04/04/19 0449  NA 140 140 136 135 139  K 3.8 4.0 3.8 3.8 3.6  CL 100 101 98 99 103  CO2 26 29 25 26 27   GLUCOSE 111* 116* 105* 100* 93  BUN 25* 22 27* 30* 27*  CREATININE 1.05* 1.09* 1.19* 1.22* 1.13*  CALCIUM 9.8 9.5 9.4 9.0 9.1   Liver Function Tests: No results for input(s): AST, ALT, ALKPHOS, BILITOT, PROT, ALBUMIN in the last 168 hours. No results for input(s): LIPASE, AMYLASE in the last 168 hours. No results for input(s): AMMONIA in the last 168 hours. CBC: Recent Labs  Lab 03/31/19 1759 04/01/19 0520 04/02/19 0529 04/03/19 0503 04/04/19 0449  WBC 12.9* 13.5* 14.2* 11.0* 9.7  NEUTROABS 10.9* 11.9*  --   --   --   HGB 12.2 10.9* 11.0* 10.4* 11.5*  HCT 39.0 34.4* 34.9* 32.7* 36.5  MCV 94.7 94.5 94.3 94.8 95.5  PLT 424* 371 332 318 332   Cardiac Enzymes: No results for input(s): CKTOTAL, CKMB, CKMBINDEX, TROPONINI in the last 168 hours. BNP: Invalid input(s): POCBNP CBG: No  results for input(s): GLUCAP in the last 168 hours. D-Dimer No results for input(s): DDIMER in the last 72 hours. Hgb A1c No results for input(s): HGBA1C in the last 72 hours. Lipid Profile No results for input(s): CHOL, HDL, LDLCALC, TRIG, CHOLHDL, LDLDIRECT in the last 72 hours. Thyroid function studies No results for input(s): TSH, T4TOTAL, T3FREE, THYROIDAB in the last 72 hours.  Invalid input(s): FREET3 Anemia work up No results for input(s): VITAMINB12, FOLATE, FERRITIN, TIBC, IRON, RETICCTPCT in the last 72 hours. Urinalysis    Component Value Date/Time   COLORURINE YELLOW 04/01/2019 1455   APPEARANCEUR CLEAR 04/01/2019 1455   LABSPEC 1.023 04/01/2019 1455   PHURINE 8.0 04/01/2019 1455   GLUCOSEU NEGATIVE 04/01/2019 1455   HGBUR NEGATIVE 04/01/2019 1455   BILIRUBINUR NEGATIVE 04/01/2019 1455   KETONESUR NEGATIVE 04/01/2019 1455   PROTEINUR 30 (A) 04/01/2019 1455   NITRITE NEGATIVE 04/01/2019 1455   LEUKOCYTESUR  NEGATIVE 04/01/2019 1455   Sepsis Labs Invalid input(s): PROCALCITONIN,  WBC,  LACTICIDVEN Microbiology Recent Results (from the past 240 hour(s))  SARS CORONAVIRUS 2 (TAT 6-24 HRS) Nasopharyngeal Nasopharyngeal Swab     Status: None   Collection Time: 03/31/19 10:57 PM   Specimen: Nasopharyngeal Swab  Result Value Ref Range Status   SARS Coronavirus 2 NEGATIVE NEGATIVE Final    Comment: (NOTE) SARS-CoV-2 target nucleic acids are NOT DETECTED. The SARS-CoV-2 RNA is generally detectable in upper and lower respiratory specimens during the acute phase of infection. Negative results do not preclude SARS-CoV-2 infection, do not rule out co-infections with other pathogens, and should not be used as the sole basis for treatment or other patient management decisions. Negative results must be combined with clinical observations, patient history, and epidemiological information. The expected result is Negative. Fact Sheet for Patients: HairSlick.no Fact Sheet for Healthcare Providers: quierodirigir.com This test is not yet approved or cleared by the [place] FDA and  has been authorized for detection and/or diagnosis of SARS-CoV-2 by FDA under an Emergency Use Authorization (EUA). This EUA will remain  in effect (meaning this test can be used) for the duration of the COVID-19 declaration under Section 56 4(b)(1) of the Act, 21 U.S.C. section 360bbb-3(b)(1), unless the authorization is terminated or revoked sooner. Performed at [facility], [address], [place], [place] [address]   Urine Culture     Status: Abnormal   Collection Time: 04/01/19  2:55 PM   Specimen: Urine, Clean Catch  Result Value Ref Range Status   Specimen Description   Final    URINE, CLEAN CATCH Performed at [facility], [address], [place], [place] [address]    Special Requests   Final    NONE Performed at [facility], [address] [place], [place] [address]    Culture 30,000 COLONIES/mL KLEBSIELLA PNEUMONIAE (A)  Final   Report Status 04/03/2019 FINAL  Final   Organism ID, Bacteria KLEBSIELLA PNEUMONIAE (A)  Final      Susceptibility   Klebsiella pneumoniae - MIC*    AMPICILLIN >=32 RESISTANT Resistant     CEFAZOLIN <=4 SENSITIVE Sensitive     CEFTRIAXONE <=1 SENSITIVE Sensitive     CIPROFLOXACIN <=0.25 SENSITIVE Sensitive     GENTAMICIN <=1 SENSITIVE Sensitive     IMIPENEM <=0.25 SENSITIVE Sensitive     NITROFURANTOIN 128 RESISTANT Resistant     TRIMETH/SULFA <=20 SENSITIVE Sensitive     AMPICILLIN/SULBACTAM 4 SENSITIVE Sensitive     PIP/TAZO <=4 SENSITIVE Sensitive  Extended ESBL NEGATIVE Sensitive     * 30,000 COLONIES/mL KLEBSIELLA PNEUMONIAE  SARS CORONAVIRUS 2 (TAT 6-24 HRS) Nasopharyngeal Nasopharyngeal Swab     Status: None   Collection Time: 04/03/19 12:02 PM   Specimen: Nasopharyngeal Swab  Result Value Ref Range Status   SARS Coronavirus 2 NEGATIVE NEGATIVE Final    Comment: (NOTE) SARS-CoV-2 target nucleic acids are NOT DETECTED. The SARS-CoV-2 RNA is generally detectable in upper and lower respiratory specimens during the acute phase of infection. Negative results do not preclude SARS-CoV-2 infection, do not rule out co-infections with other pathogens, and should not be used as the sole basis for treatment or other patient management decisions. Negative results must be combined with clinical observations, patient history, and epidemiological information. The expected result is Negative. Fact Sheet for Patients: HairSlick.no Fact Sheet for Healthcare Providers: quierodirigir.com This test is not yet approved or cleared by the [place] FDA and  has been authorized for detection and/or diagnosis of SARS-CoV-2 by FDA under an Emergency Use Authorization (EUA). This EUA will remain  in effect  (meaning this test can be used) for the duration of the COVID-19 declaration under Section 56 4(b)(1) of the Act, 21 U.S.C. section 360bbb-3(b)(1), unless the authorization is terminated or revoked sooner. Performed at [facility], [address], [place], [place] [address]     Procedures/Studies: Dg Chest 1 View  Result Date: 03/31/2019 CLINICAL DATA:  Fall EXAM: CHEST  1 VIEW COMPARISON:  03/17/2017 FINDINGS: Normal heart size. No pericardial effusion. Diffuse coarsened interstitial markings identified bilaterally. No superimposed pleural effusion, pulmonary edema or airspace consolidation. IMPRESSION: No active disease. Electronically Signed   By: Signa Kell M.D.   On: 03/31/2019 17:45   Dg Lumbar Spine Complete  Result Date: 03/31/2019 CLINICAL DATA:  Per EMS, patient from [facility] assisted living, c/o left hip pain after trip and fall. Pt c/o left knee pain. Best images obtained. EXAM: LUMBAR SPINE - COMPLETE 4+ VIEW COMPARISON:  08/02/2008 FINDINGS: There is marked degenerative change throughout the lumbar spine. Significant disc height loss and osteophytes throughout all levels. Mild scoliosis, mild mid lumbar scoliosis is stable. There has been wedge compression fracture of T11 since the prior study. This is only seen on the anterior and oblique images. The age of this fracture is not determined. There is atherosclerotic calcification of the abdominal aorta. Visualized bowel gas pattern is nonobstructive. IMPRESSION: 1. T11 compression fracture, new since the prior study and of indeterminate age. Given the history of fall, consider thoracic spine series. 2. Marked degenerative changes throughout the lumbar spine. Electronically Signed   By: Norva Pavlov M.D.   On: 03/31/2019 17:47   Ct Head Wo Contrast  Result Date: 03/31/2019 CLINICAL DATA:  Per EMS, patient from [facility] assisted living, c/o left hip pain after trip and fall. Denies LOC. No deformity noted.  Denies taking blood thinners. Answers questions appropriately EXAM: CT HEAD WITHOUT CONTRAST CT CERVICAL SPINE WITHOUT CONTRAST TECHNIQUE: Multidetector CT imaging of the head and cervical spine was performed following the standard protocol without intravenous contrast. Multiplanar CT image reconstructions of the cervical spine were also generated. COMPARISON:  03/31/2017 FINDINGS: CT HEAD FINDINGS Brain: There is central and cortical atrophy. Periventricular white matter changes are consistent with small vessel disease. There is no intra or extra-axial fluid collection or mass lesion. The basilar cisterns and ventricles have a normal appearance. There is no CT evidence for acute infarction or hemorrhage. Vascular: There is atherosclerotic calcification of  the internal carotid arteries. No hyperdense vessels. Skull: Normal. Negative for fracture or focal lesion. Sinuses/Orbits: Paranasal sinus mucosal thickening. No air-fluid levels or evidence for sinus wall fractures. Other: LEFT parietal scalp edema/hematoma without underlying fracture. CT CERVICAL SPINE FINDINGS Alignment: Normal. Mild convex RIGHT scoliosis Skull base and vertebrae: No acute fracture. No primary bone lesion or focal pathologic process. Soft tissues and spinal canal: No prevertebral fluid or swelling. No visible canal hematoma. Disc levels: Significant degenerative changes throughout the cervical spine, most notably at C4-5 and C5-6. Upper chest: Biapical pleuroparenchymal changes at the lung apices. Other: There is dense atherosclerotic calcification of the carotid arteries. IMPRESSION: 1. Atrophy and small vessel disease. 2. No evidence for acute intracranial abnormality. 3. LEFT parietal scalp edema/hematoma without underlying fracture. 4. Significant degenerative changes in the cervical spine. 5. No evidence for acute cervical spine abnormality. 6. Dense atherosclerotic calcification of the common and internal carotid arteries.  Electronically Signed   By: Norva Pavlov M.D.   On: 03/31/2019 18:06   Ct Cervical Spine Wo Contrast  Result Date: 03/31/2019 CLINICAL DATA:  Per EMS, patient from [facility] assisted living, c/o left hip pain after trip and fall. Denies LOC. No deformity noted. Denies taking blood thinners. Answers questions appropriately EXAM: CT HEAD WITHOUT CONTRAST CT CERVICAL SPINE WITHOUT CONTRAST TECHNIQUE: Multidetector CT imaging of the head and cervical spine was performed following the standard protocol without intravenous contrast. Multiplanar CT image reconstructions of the cervical spine were also generated. COMPARISON:  03/31/2017 FINDINGS: CT HEAD FINDINGS Brain: There is central and cortical atrophy. Periventricular white matter changes are consistent with small vessel disease. There is no intra or extra-axial fluid collection or mass lesion. The basilar cisterns and ventricles have a normal appearance. There is no CT evidence for acute infarction or hemorrhage. Vascular: There is atherosclerotic calcification of the internal carotid arteries. No hyperdense vessels. Skull: Normal. Negative for fracture or focal lesion. Sinuses/Orbits: Paranasal sinus mucosal thickening. No air-fluid levels or evidence for sinus wall fractures. Other: LEFT parietal scalp edema/hematoma without underlying fracture. CT CERVICAL SPINE FINDINGS Alignment: Normal. Mild convex RIGHT scoliosis Skull base and vertebrae: No acute fracture. No primary bone lesion or focal pathologic process. Soft tissues and spinal canal: No prevertebral fluid or swelling. No visible canal hematoma. Disc levels: Significant degenerative changes throughout the cervical spine, most notably at C4-5 and C5-6. Upper chest: Biapical pleuroparenchymal changes at the lung apices. Other: There is dense atherosclerotic calcification of the carotid arteries. IMPRESSION: 1. Atrophy and small vessel disease. 2. No evidence for acute intracranial abnormality. 3.  LEFT parietal scalp edema/hematoma without underlying fracture. 4. Significant degenerative changes in the cervical spine. 5. No evidence for acute cervical spine abnormality. 6. Dense atherosclerotic calcification of the common and internal carotid arteries. Electronically Signed   By: Norva Pavlov M.D.   On: 03/31/2019 18:06   Ct T-spine No Charge  Result Date: 03/31/2019 CLINICAL DATA:  Blood fall, back pain, hypertension EXAM: CT LUMBAR SPINE WITHOUT CONTRAST TECHNIQUE: Multidetector CT imaging of the lumbar spine was performed without intravenous contrast administration. Multiplanar CT image reconstructions were also generated. COMPARISON:  None. FINDINGS: Thoracic spine: Alignment: Normal Vertebrae: There is a superior compression deformity of the T11 vertebral body with approximately 75% loss in vertebral body height. There is buckling of the anterior superior and posterosuperior cortex is. No retropulsion of fragments is seen. Paraspinal and other soft tissues: The paraspinal soft tissues and visualized retroperitoneal structures are unremarkable. The sacroiliac joints are intact.  Biapical scarring/atelectasis is seen. Disc levels: Mild disc height loss with facet arthrosis is seen throughout the thoracic spine. At T10-T11 there is buckling of the superior cortex of T11 which causes mild canal narrowing. There is also mild neural foraminal narrowing. Lumbar spine: Segmentation: There are 5 non-rib bearing lumbar type vertebral bodies with the last intervertebral disc space labeled as L5-S1. Alignment: There is a mild S-shaped scoliotic curvature of the lumbar spine. Slight lateral listhesis of L2 on L3 and L3 on L4 seen. Vertebrae: The vertebral body heights are well maintained. No fracture, malalignment, or pathologic osseous lesions seen. Paraspinal and other soft tissues: The paraspinal soft tissues and visualized retroperitoneal structures are unremarkable. The sacroiliac joints are intact.  Scattered dense aortic atherosclerosis is noted. Disc levels: There is disc height loss with facet arthrosis and vacuum phenomenon seen throughout the lumbar spine. This is most notable at L2-L3 and L3-L4 with severe left neural foraminal narrowing and moderate central canal stenosis. IMPRESSION: 1. Age indeterminate superior compression deformity of the T11 vertebral body with 75% loss in vertebral body height. This is new since prior exam of 2018. There is buckling of the superior cortex. However no retropulsion of fragments. 2. Mild S-shaped scoliotic curvature of the lumbar spine. 3. Lumbar spine spondylosis most notable at L2-L3 and L3-L4 with severe left neural foraminal narrowing and moderate central canal stenosis. Electronically Signed   By: Prudencio Pair M.D.   On: 03/31/2019 20:06   Ct L-spine No Charge  Result Date: 03/31/2019 CLINICAL DATA:  Blood fall, back pain, hypertension EXAM: CT LUMBAR SPINE WITHOUT CONTRAST TECHNIQUE: Multidetector CT imaging of the lumbar spine was performed without intravenous contrast administration. Multiplanar CT image reconstructions were also generated. COMPARISON:  None. FINDINGS: Thoracic spine: Alignment: Normal Vertebrae: There is a superior compression deformity of the T11 vertebral body with approximately 75% loss in vertebral body height. There is buckling of the anterior superior and posterosuperior cortex is. No retropulsion of fragments is seen. Paraspinal and other soft tissues: The paraspinal soft tissues and visualized retroperitoneal structures are unremarkable. The sacroiliac joints are intact. Biapical scarring/atelectasis is seen. Disc levels: Mild disc height loss with facet arthrosis is seen throughout the thoracic spine. At T10-T11 there is buckling of the superior cortex of T11 which causes mild canal narrowing. There is also mild neural foraminal narrowing. Lumbar spine: Segmentation: There are 5 non-rib bearing lumbar type vertebral bodies with  the last intervertebral disc space labeled as L5-S1. Alignment: There is a mild S-shaped scoliotic curvature of the lumbar spine. Slight lateral listhesis of L2 on L3 and L3 on L4 seen. Vertebrae: The vertebral body heights are well maintained. No fracture, malalignment, or pathologic osseous lesions seen. Paraspinal and other soft tissues: The paraspinal soft tissues and visualized retroperitoneal structures are unremarkable. The sacroiliac joints are intact. Scattered dense aortic atherosclerosis is noted. Disc levels: There is disc height loss with facet arthrosis and vacuum phenomenon seen throughout the lumbar spine. This is most notable at L2-L3 and L3-L4 with severe left neural foraminal narrowing and moderate central canal stenosis. IMPRESSION: 1. Age indeterminate superior compression deformity of the T11 vertebral body with 75% loss in vertebral body height. This is new since prior exam of 2018. There is buckling of the superior cortex. However no retropulsion of fragments. 2. Mild S-shaped scoliotic curvature of the lumbar spine. 3. Lumbar spine spondylosis most notable at L2-L3 and L3-L4 with severe left neural foraminal narrowing and moderate central canal stenosis. Electronically Signed  By: Jonna Clark M.D.   On: 03/31/2019 20:06   Dg Knee Complete 4 Views Left  Result Date: 03/31/2019 CLINICAL DATA:  Fall EXAM: LEFT KNEE - COMPLETE 4+ VIEW COMPARISON:  None FINDINGS: Osseous demineralization. Extensive chondrocalcinosis. Minimal joint space narrowing. No acute fracture, dislocation, or bone destruction. Scattered atherosclerotic calcifications. No knee joint effusion. IMPRESSION: Osseous demineralization with minimal degenerative changes and scattered chondrocalcinosis question CPPD. No acute osseous abnormalities. Electronically Signed   By: Ulyses Southward M.D.   On: 03/31/2019 17:47   Ct Angio Chest/abd/pel For Dissection W And/or Wo Contrast  Result Date: 03/31/2019 CLINICAL DATA:   85 year old with back pain after trip and fall. Minor trauma. Hypertension. Rule out dissection. EXAM: CT ANGIOGRAPHY CHEST, ABDOMEN AND PELVIS TECHNIQUE: Multidetector CT imaging through the chest, abdomen and pelvis was performed using the standard protocol during bolus administration of intravenous contrast. Multiplanar reconstructed images and MIPs were obtained and reviewed to evaluate the vascular anatomy. CONTRAST:  OMNIPAQUE IOHEXOL 350 MG/ML SOLN COMPARISON:  Chest radiograph earlier this day. FINDINGS: CTA CHEST FINDINGS Cardiovascular: Noncontrast CT demonstrates no aortic hematoma. No aneurysm. Moderate atherosclerosis of the thoracic aorta and branch vessels. Post contrast there is no evidence of acute aortic injury, dissection, vasculitis or acute aortic syndrome. No filling defects in the central most pulmonary arteries to the lobar level. There are coronary artery calcifications. Borderline cardiomegaly. No pericardial effusion. Mediastinum/Nodes: Calcified mediastinal lymph nodes consistent with prior granulomatous disease. No noncalcified adenopathy. No aortic hematoma or hemorrhage. No pneumomediastinum. No esophageal wall thickening. No thyroid nodule. Lungs/Pleura: No pneumothorax. Biapical pleuroparenchymal scarring. Mild dependent atelectasis. Small intraparenchymal cyst in the left lower lobe specific clinical significance. No pulmonary edema or pleural effusion. Calcified granuloma in the right lower lobe. Trachea and mainstem bronchi are patent, mild motion artifact limitations. Musculoskeletal: Thoracic spine better assessed on dedicated thoracic spine reformats. T11 compression fracture. No acute fracture of the sternum, ribs, included clavicles or shoulder girdles. Review of the MIP images confirms the above findings. CTA ABDOMEN AND PELVIS FINDINGS VASCULAR Aorta: Atherosclerotic and tortuous. No dissection or evidence of acute injury. No frank aneurysmal dilatation. Celiac: No  dissection or acute injury. Calcified plaque at the origin causes approximately 50% luminal stenosis. SMA: No dissection or acute injury. Calcified plaque at the origin causes approximately 50% luminal narrowing. Branch vessels are patent. Renals: Plaque at the origin of both renal arteries. Approximately 50% luminal narrowing the the origin of both renal arteries, left greater than right. IMA: Patent without evidence of aneurysm, dissection, vasculitis or significant stenosis. Inflow: Atherosclerotic and tortuous. No dissection or acute abnormality. Veins: No obvious venous abnormality within the limitations of this arterial phase study. Review of the MIP images confirms the above findings. NON-VASCULAR Hepatobiliary: Arterial phase imaging limits evaluation for acute injury. No evidence of focal hepatic abnormality. No perihepatic hematoma. Gallbladder physiologically distended, no calcified stone. No biliary dilatation. Pancreas: No ductal dilatation or inflammation. Spleen: Normal in size and arterial phase enhancement. Mild motion artifact limits detailed assessment. No perisplenic hematoma. Adrenals/Urinary Tract: No renal or adrenal hemorrhage. No hydronephrosis. No perinephric edema. Distended urinary bladder without wall thickening. Stomach/Bowel: Colonic diverticulosis, advanced in the descending and sigmoid colon. Motion artifact limits detailed bowel assessment. No evidence of bowel inflammation or obstruction. No mesenteric hematoma. Lymphatic: No enlarged lymph nodes in the abdomen or pelvis. Reproductive: Uterus and bilateral adnexa are unremarkable. Other: No free air or free fluid. No confluent body wall contusion. Musculoskeletal: Minimally displaced left superior pubic ramus  fracture extending to the pubic body. Nondisplaced left inferior pubic ramus fracture. Left hip joint remains intact. Lumbar spine better assessed on dedicated spine reformats. There are enthesopathic changes of the ischial  tuberosities. Review of the MIP images confirms the above findings. IMPRESSION: 1. No aortic dissection or acute aortic injury. Diffuse aortic tortuosity and atherosclerosis. 2. Minimally displaced left superior and nondisplaced inferior pubic ramus fractures. 3. No additional acute traumatic injury to the chest, abdomen, or pelvis, allowing for limitations related to arterial phase imaging. 4. Colonic diverticulosis. 5. Thoracic and lumbar spine assessed on separately reported spine reformats. Aortic Atherosclerosis (ICD10-I70.0). Electronically Signed   By: Narda RutherfordMelanie  Sanford M.D.   On: 03/31/2019 20:07   Dg Hip Unilat W Or Wo Pelvis 2-3 Views Left  Result Date: 03/31/2019 CLINICAL DATA:  Left hip pain after fall. EXAM: DG HIP (WITH OR WITHOUT PELVIS) 2-3V LEFT COMPARISON:  None. FINDINGS: Mild diffuse osteopenia. Degenerative changes identified within the lumbar spine and both hips. No acute fracture or dislocation. IMPRESSION: 1. No acute findings. 2. Osteopenia and degenerative changes. Electronically Signed   By: Signa Kellaylor  Stroud M.D.   On: 03/31/2019 17:47     Time coordinating discharge: Over 30 minutes  SIGNED:   Alessandra BevelsNeelima Keatyn Luck, MD  [facility] 04/04/2019, 2:15 PM Pager : [phone]

## 2019-04-04 NOTE — TOC Transition Note (Signed)
Transition of Care [facility]) - CM/SW Discharge Note   Patient Details  Name: [name] Love MRN: [id number] Date of Birth: Sep 27, 1922  Transition of [facility]) CM/SW Contact:  Wende Neighbors, LCSW Phone Number: 04/04/2019, 2:41 PM   Clinical Narrative:   Patient to go to [facility] via [place]. RN to call [phone] (rm# [phone]) for report. PTAR has been called     Final next level of care: [place] Barriers to Discharge: No Barriers Identified   Patient Goals and CMS Choice Patient states their goals for this hospitalization and ongoing recovery are:: go home CMS Medicare.gov Compare Post Acute Care list provided to:: Patient Represenative (must comment)(dtr Nunzio Cory 336 [phone])    Discharge Placement              Patient chooses bed at: [facility] Patient to be transferred to facility by: ptar Name of family member notified: rn to call daughter Patient and family notified of of transfer: 04/04/19  Discharge Plan and Services   Discharge Planning Services: CM Consult                                 Social Determinants of Health ([place]) Interventions     Readmission Risk Interventions No flowsheet data found.

## 2019-04-04 NOTE — TOC Progression Note (Signed)
Transition of Care [facility]) - Progression Note    Patient Details  Name: [name] Love MRN: [id number] Date of Birth: 06/14/1922  Transition of Care [facility]) CM/SW [place], LCSW Phone Number: 04/04/2019, 1:59 PM  Clinical Narrative:   Patient received authorization from [facility] stating she has been approved to go to [facility]. Reference: [id number], start date today and next review date is for 10/28.Patient has a bed and insurance authorization through insurance, CSW made MD aware     Expected Discharge Plan: [place] Barriers to Discharge: Continued Medical Work up  Expected Discharge Plan and Services Expected Discharge Plan: [place]   Discharge Planning Services: CM Consult   Living arrangements for the past 2 months: [place]                                       Social Determinants of Health (SDOH) Interventions    Readmission Risk Interventions No flowsheet data found.

## 2019-04-04 NOTE — Progress Notes (Signed)
PTAR  Here to transport. NAD noted

## 2019-04-04 NOTE — Progress Notes (Signed)
D/c ing to [place] called report to [facility]. VSS. Voices no c/o. Awaiting PTAR for transport.

## 2019-04-04 NOTE — Care Management Important Message (Signed)
Important Message  Patient Details IM Letter given to Rhea Pink SW to present to the Patient Name: [name] Love MRN: [id number] Date of Birth: August 12, 1922   Medicare Important Message Given:  Yes     Kerin Salen 04/04/2019, 11:31 AM

## 2019-04-04 NOTE — Progress Notes (Signed)
Subjective: The patient looks comfortable lying in bed today she is able to move the leg without significant pain.  She has no recollection of her pelvic fracture or why she is in the hospital.   Objective: Vital signs in last 24 hours: Temp:  [98 F (36.7 C)-98.5 F (36.9 C)] 98.3 F (36.8 C) (10/26 0605) Pulse Rate:  [71-79] 79 (10/26 0605) Resp:  [16-19] 16 (10/26 0605) BP: (154-185)/(66-77) 185/66 (10/26 0605) SpO2:  [96 %-97 %] 96 % (10/26 0605)  Intake/Output from previous day: 10/25 0701 - 10/26 0700 In: 360 [P.O.:360] Out: 1800 [Urine:1800] Intake/Output this shift: No intake/output data recorded.  Recent Labs    04/02/19 0529 04/03/19 0503 04/04/19 0449  HGB 11.0* 10.4* 11.5*   Recent Labs    04/03/19 0503 04/04/19 0449  WBC 11.0* 9.7  RBC 3.45* 3.82*  HCT 32.7* 36.5  PLT 318 332   Recent Labs    04/03/19 0503 04/04/19 0449  NA 135 139  K 3.8 3.6  CL 99 103  CO2 26 27  BUN 30* 27*  CREATININE 1.22* 1.13*  GLUCOSE 100* 93  CALCIUM 9.0 9.1   No results for input(s): LABPT, INR in the last 72 hours.  Neurologically intact ABD soft Neurovascular intact Sensation intact distally Intact pulses distally Dorsiflexion/Plantar flexion intact No cellulitis present Compartment soft    Assessment/Plan: Inferior and superior pubic ramus fractures left side.  There are no restrictions to the patient's activity.  She really should be up with therapy with a walker.  She should advance her activity on a daily basis.  I will plan on seeing her back in the office in 2 weeks after discharge.     Alta Corning 04/04/2019, 8:08 AM

## 2019-04-05 ENCOUNTER — Other Ambulatory Visit: Payer: Medicare Other | Admitting: Internal Medicine

## 2019-04-05 ENCOUNTER — Other Ambulatory Visit: Payer: Self-pay

## 2019-04-05 DIAGNOSIS — Z515 Encounter for palliative care: Secondary | ICD-10-CM

## 2019-04-06 DIAGNOSIS — E039 Hypothyroidism, unspecified: Secondary | ICD-10-CM | POA: Diagnosis not present

## 2019-04-06 DIAGNOSIS — I959 Hypotension, unspecified: Secondary | ICD-10-CM | POA: Diagnosis not present

## 2019-04-06 DIAGNOSIS — M6281 Muscle weakness (generalized): Secondary | ICD-10-CM | POA: Diagnosis not present

## 2019-04-06 DIAGNOSIS — E559 Vitamin D deficiency, unspecified: Secondary | ICD-10-CM | POA: Diagnosis not present

## 2019-04-06 NOTE — Progress Notes (Signed)
    [place] Consult Note Telephone: [phone]  Fax: [phone]  PATIENT NAME: [name] Love DOB: 1922-09-13 MRN: [id number]  PRIMARY CARE PROVIDER:   Lajean Manes, MD  REFERRING PROVIDER:  Lajean Manes, MD [address] [place],  [place] [address]    NOTE:  I arrived at [place] for a scheduled appt with patient in the [place] unit.  I was informed that she was now in the rehab center after falling and being admitted to the hospital.  Review of medical records note admission from 10/22-10/26 due to a L pubic rami fracture and HTN.  The care center was phoned and in-person visitation is not allowed at this time. Plan on contacting provider for order for palliative care while at facility.  Gonzella Lex, NP-C

## 2019-04-11 DIAGNOSIS — I10 Essential (primary) hypertension: Secondary | ICD-10-CM | POA: Diagnosis not present

## 2019-04-11 DIAGNOSIS — R296 Repeated falls: Secondary | ICD-10-CM | POA: Diagnosis not present

## 2019-04-11 DIAGNOSIS — E039 Hypothyroidism, unspecified: Secondary | ICD-10-CM | POA: Diagnosis not present

## 2019-04-11 DIAGNOSIS — E559 Vitamin D deficiency, unspecified: Secondary | ICD-10-CM | POA: Diagnosis not present

## 2019-04-13 DIAGNOSIS — D72829 Elevated white blood cell count, unspecified: Secondary | ICD-10-CM | POA: Diagnosis not present

## 2019-04-13 DIAGNOSIS — M6281 Muscle weakness (generalized): Secondary | ICD-10-CM | POA: Diagnosis not present

## 2019-04-13 DIAGNOSIS — E039 Hypothyroidism, unspecified: Secondary | ICD-10-CM | POA: Diagnosis not present

## 2019-04-13 DIAGNOSIS — G4700 Insomnia, unspecified: Secondary | ICD-10-CM | POA: Diagnosis not present

## 2019-04-14 DIAGNOSIS — M800AXD Age-related osteoporosis with current pathological fracture, other site, subsequent encounter for fracture with routine healing: Secondary | ICD-10-CM | POA: Diagnosis not present

## 2019-04-14 DIAGNOSIS — M84454D Pathological fracture, pelvis, subsequent encounter for fracture with routine healing: Secondary | ICD-10-CM | POA: Diagnosis not present

## 2019-04-14 DIAGNOSIS — G8911 Acute pain due to trauma: Secondary | ICD-10-CM | POA: Diagnosis not present

## 2019-04-14 DIAGNOSIS — M6281 Muscle weakness (generalized): Secondary | ICD-10-CM | POA: Diagnosis not present

## 2019-04-15 DIAGNOSIS — E039 Hypothyroidism, unspecified: Secondary | ICD-10-CM | POA: Diagnosis not present

## 2019-04-15 DIAGNOSIS — G4700 Insomnia, unspecified: Secondary | ICD-10-CM | POA: Diagnosis not present

## 2019-04-15 DIAGNOSIS — D72829 Elevated white blood cell count, unspecified: Secondary | ICD-10-CM | POA: Diagnosis not present

## 2019-04-15 DIAGNOSIS — M6281 Muscle weakness (generalized): Secondary | ICD-10-CM | POA: Diagnosis not present

## 2019-04-18 DIAGNOSIS — G8911 Acute pain due to trauma: Secondary | ICD-10-CM | POA: Diagnosis not present

## 2019-04-18 DIAGNOSIS — M800AXD Age-related osteoporosis with current pathological fracture, other site, subsequent encounter for fracture with routine healing: Secondary | ICD-10-CM | POA: Diagnosis not present

## 2019-04-18 DIAGNOSIS — M6281 Muscle weakness (generalized): Secondary | ICD-10-CM | POA: Diagnosis not present

## 2019-04-18 DIAGNOSIS — M84454D Pathological fracture, pelvis, subsequent encounter for fracture with routine healing: Secondary | ICD-10-CM | POA: Diagnosis not present

## 2019-04-19 ENCOUNTER — Ambulatory Visit: Payer: Medicare Other | Admitting: Podiatry

## 2019-04-19 ENCOUNTER — Other Ambulatory Visit: Payer: Self-pay

## 2019-04-19 ENCOUNTER — Non-Acute Institutional Stay: Payer: Medicare Other | Admitting: Internal Medicine

## 2019-04-20 DIAGNOSIS — I10 Essential (primary) hypertension: Secondary | ICD-10-CM | POA: Diagnosis not present

## 2019-04-21 DIAGNOSIS — M25552 Pain in left hip: Secondary | ICD-10-CM | POA: Diagnosis not present

## 2019-04-21 DIAGNOSIS — S32592K Other specified fracture of left pubis, subsequent encounter for fracture with nonunion: Secondary | ICD-10-CM | POA: Diagnosis not present

## 2019-04-25 DIAGNOSIS — I10 Essential (primary) hypertension: Secondary | ICD-10-CM | POA: Diagnosis not present

## 2019-04-25 DIAGNOSIS — M84454D Pathological fracture, pelvis, subsequent encounter for fracture with routine healing: Secondary | ICD-10-CM | POA: Diagnosis not present

## 2019-04-25 DIAGNOSIS — G8911 Acute pain due to trauma: Secondary | ICD-10-CM | POA: Diagnosis not present

## 2019-04-25 DIAGNOSIS — M800AXD Age-related osteoporosis with current pathological fracture, other site, subsequent encounter for fracture with routine healing: Secondary | ICD-10-CM | POA: Diagnosis not present

## 2019-04-26 DIAGNOSIS — I10 Essential (primary) hypertension: Secondary | ICD-10-CM | POA: Diagnosis not present

## 2019-04-26 DIAGNOSIS — D649 Anemia, unspecified: Secondary | ICD-10-CM | POA: Diagnosis not present

## 2019-04-27 DIAGNOSIS — Z111 Encounter for screening for respiratory tuberculosis: Secondary | ICD-10-CM | POA: Diagnosis not present

## 2019-04-29 DIAGNOSIS — I10 Essential (primary) hypertension: Secondary | ICD-10-CM | POA: Diagnosis not present

## 2019-05-01 DIAGNOSIS — Z03818 Encounter for observation for suspected exposure to other biological agents ruled out: Secondary | ICD-10-CM | POA: Diagnosis not present

## 2019-05-06 DIAGNOSIS — Z9181 History of falling: Secondary | ICD-10-CM | POA: Diagnosis not present

## 2019-05-06 DIAGNOSIS — D649 Anemia, unspecified: Secondary | ICD-10-CM | POA: Diagnosis not present

## 2019-05-09 DIAGNOSIS — M84454D Pathological fracture, pelvis, subsequent encounter for fracture with routine healing: Secondary | ICD-10-CM | POA: Diagnosis not present

## 2019-05-09 DIAGNOSIS — M800AXD Age-related osteoporosis with current pathological fracture, other site, subsequent encounter for fracture with routine healing: Secondary | ICD-10-CM | POA: Diagnosis not present

## 2019-05-09 DIAGNOSIS — G8911 Acute pain due to trauma: Secondary | ICD-10-CM | POA: Diagnosis not present

## 2019-05-09 DIAGNOSIS — I10 Essential (primary) hypertension: Secondary | ICD-10-CM | POA: Diagnosis not present

## 2019-05-10 DIAGNOSIS — I10 Essential (primary) hypertension: Secondary | ICD-10-CM | POA: Diagnosis not present

## 2019-05-11 DIAGNOSIS — Z03818 Encounter for observation for suspected exposure to other biological agents ruled out: Secondary | ICD-10-CM | POA: Diagnosis not present

## 2019-05-13 DIAGNOSIS — D649 Anemia, unspecified: Secondary | ICD-10-CM | POA: Diagnosis not present

## 2019-05-13 DIAGNOSIS — D500 Iron deficiency anemia secondary to blood loss (chronic): Secondary | ICD-10-CM | POA: Diagnosis not present

## 2019-05-16 DIAGNOSIS — D649 Anemia, unspecified: Secondary | ICD-10-CM | POA: Diagnosis not present

## 2019-05-16 DIAGNOSIS — I509 Heart failure, unspecified: Secondary | ICD-10-CM | POA: Diagnosis not present

## 2019-05-16 DIAGNOSIS — I10 Essential (primary) hypertension: Secondary | ICD-10-CM | POA: Diagnosis not present

## 2019-05-17 DIAGNOSIS — Z9181 History of falling: Secondary | ICD-10-CM | POA: Diagnosis not present

## 2019-05-17 DIAGNOSIS — M6281 Muscle weakness (generalized): Secondary | ICD-10-CM | POA: Diagnosis not present

## 2019-05-17 DIAGNOSIS — G8911 Acute pain due to trauma: Secondary | ICD-10-CM | POA: Diagnosis not present

## 2019-05-17 DIAGNOSIS — M800AXD Age-related osteoporosis with current pathological fracture, other site, subsequent encounter for fracture with routine healing: Secondary | ICD-10-CM | POA: Diagnosis not present

## 2019-05-18 DIAGNOSIS — Z7189 Other specified counseling: Secondary | ICD-10-CM | POA: Diagnosis not present

## 2019-05-18 DIAGNOSIS — D649 Anemia, unspecified: Secondary | ICD-10-CM | POA: Diagnosis not present

## 2019-05-18 DIAGNOSIS — K921 Melena: Secondary | ICD-10-CM | POA: Diagnosis not present

## 2019-05-18 DIAGNOSIS — D729 Disorder of white blood cells, unspecified: Secondary | ICD-10-CM | POA: Diagnosis not present

## 2019-05-23 DIAGNOSIS — D500 Iron deficiency anemia secondary to blood loss (chronic): Secondary | ICD-10-CM | POA: Diagnosis not present

## 2019-05-23 DIAGNOSIS — D72829 Elevated white blood cell count, unspecified: Secondary | ICD-10-CM | POA: Diagnosis not present

## 2019-05-23 DIAGNOSIS — D649 Anemia, unspecified: Secondary | ICD-10-CM | POA: Diagnosis not present

## 2019-05-30 DIAGNOSIS — D500 Iron deficiency anemia secondary to blood loss (chronic): Secondary | ICD-10-CM | POA: Diagnosis not present

## 2019-05-30 DIAGNOSIS — D72829 Elevated white blood cell count, unspecified: Secondary | ICD-10-CM | POA: Diagnosis not present

## 2019-05-30 DIAGNOSIS — D649 Anemia, unspecified: Secondary | ICD-10-CM | POA: Diagnosis not present

## 2019-06-01 DIAGNOSIS — Z1159 Encounter for screening for other viral diseases: Secondary | ICD-10-CM | POA: Diagnosis not present

## 2019-06-06 DIAGNOSIS — D72829 Elevated white blood cell count, unspecified: Secondary | ICD-10-CM | POA: Diagnosis not present

## 2019-06-06 DIAGNOSIS — D649 Anemia, unspecified: Secondary | ICD-10-CM | POA: Diagnosis not present

## 2019-06-06 DIAGNOSIS — Z03818 Encounter for observation for suspected exposure to other biological agents ruled out: Secondary | ICD-10-CM | POA: Diagnosis not present

## 2019-06-06 DIAGNOSIS — D500 Iron deficiency anemia secondary to blood loss (chronic): Secondary | ICD-10-CM | POA: Diagnosis not present

## 2019-06-13 DIAGNOSIS — D72829 Elevated white blood cell count, unspecified: Secondary | ICD-10-CM | POA: Diagnosis not present

## 2019-06-13 DIAGNOSIS — D500 Iron deficiency anemia secondary to blood loss (chronic): Secondary | ICD-10-CM | POA: Diagnosis not present

## 2019-06-13 DIAGNOSIS — D649 Anemia, unspecified: Secondary | ICD-10-CM | POA: Diagnosis not present

## 2019-06-14 DIAGNOSIS — R0902 Hypoxemia: Secondary | ICD-10-CM | POA: Diagnosis not present

## 2019-06-14 DIAGNOSIS — R638 Other symptoms and signs concerning food and fluid intake: Secondary | ICD-10-CM | POA: Diagnosis not present

## 2019-06-16 DIAGNOSIS — D649 Anemia, unspecified: Secondary | ICD-10-CM | POA: Diagnosis not present

## 2019-06-16 DIAGNOSIS — I10 Essential (primary) hypertension: Secondary | ICD-10-CM | POA: Diagnosis not present

## 2019-06-17 DIAGNOSIS — E7800 Pure hypercholesterolemia, unspecified: Secondary | ICD-10-CM | POA: Diagnosis not present

## 2019-06-17 DIAGNOSIS — U071 COVID-19: Secondary | ICD-10-CM | POA: Diagnosis not present

## 2019-06-17 DIAGNOSIS — R05 Cough: Secondary | ICD-10-CM | POA: Diagnosis not present

## 2019-06-17 DIAGNOSIS — D649 Anemia, unspecified: Secondary | ICD-10-CM | POA: Diagnosis not present

## 2019-06-21 DIAGNOSIS — J9601 Acute respiratory failure with hypoxia: Secondary | ICD-10-CM | POA: Diagnosis not present

## 2019-06-21 DIAGNOSIS — R2689 Other abnormalities of gait and mobility: Secondary | ICD-10-CM | POA: Diagnosis not present

## 2019-06-21 DIAGNOSIS — M6281 Muscle weakness (generalized): Secondary | ICD-10-CM | POA: Diagnosis not present

## 2019-06-21 DIAGNOSIS — R1312 Dysphagia, oropharyngeal phase: Secondary | ICD-10-CM | POA: Diagnosis not present

## 2019-06-21 DIAGNOSIS — U071 COVID-19: Secondary | ICD-10-CM | POA: Diagnosis not present

## 2019-06-22 DIAGNOSIS — U071 COVID-19: Secondary | ICD-10-CM | POA: Diagnosis not present

## 2019-06-22 DIAGNOSIS — D649 Anemia, unspecified: Secondary | ICD-10-CM | POA: Diagnosis not present

## 2019-06-24 DIAGNOSIS — D649 Anemia, unspecified: Secondary | ICD-10-CM | POA: Diagnosis not present

## 2019-06-24 DIAGNOSIS — U071 COVID-19: Secondary | ICD-10-CM | POA: Diagnosis not present

## 2019-06-27 DIAGNOSIS — D649 Anemia, unspecified: Secondary | ICD-10-CM | POA: Diagnosis not present

## 2019-06-27 DIAGNOSIS — U071 COVID-19: Secondary | ICD-10-CM | POA: Diagnosis not present

## 2019-06-29 DIAGNOSIS — D500 Iron deficiency anemia secondary to blood loss (chronic): Secondary | ICD-10-CM | POA: Diagnosis not present

## 2019-06-29 DIAGNOSIS — I10 Essential (primary) hypertension: Secondary | ICD-10-CM | POA: Diagnosis not present

## 2019-06-29 DIAGNOSIS — D649 Anemia, unspecified: Secondary | ICD-10-CM | POA: Diagnosis not present

## 2019-06-30 DIAGNOSIS — M6281 Muscle weakness (generalized): Secondary | ICD-10-CM | POA: Diagnosis not present

## 2019-06-30 DIAGNOSIS — G8911 Acute pain due to trauma: Secondary | ICD-10-CM | POA: Diagnosis not present

## 2019-06-30 DIAGNOSIS — U071 COVID-19: Secondary | ICD-10-CM | POA: Diagnosis not present

## 2019-06-30 DIAGNOSIS — M800AXD Age-related osteoporosis with current pathological fracture, other site, subsequent encounter for fracture with routine healing: Secondary | ICD-10-CM | POA: Diagnosis not present

## 2019-07-04 DIAGNOSIS — D500 Iron deficiency anemia secondary to blood loss (chronic): Secondary | ICD-10-CM | POA: Diagnosis not present

## 2019-07-04 DIAGNOSIS — D72829 Elevated white blood cell count, unspecified: Secondary | ICD-10-CM | POA: Diagnosis not present

## 2019-07-04 DIAGNOSIS — D649 Anemia, unspecified: Secondary | ICD-10-CM | POA: Diagnosis not present

## 2019-07-11 ENCOUNTER — Non-Acute Institutional Stay: Payer: Medicare Other | Admitting: Internal Medicine

## 2019-07-13 DIAGNOSIS — D649 Anemia, unspecified: Secondary | ICD-10-CM | POA: Diagnosis not present

## 2019-07-25 DIAGNOSIS — E039 Hypothyroidism, unspecified: Secondary | ICD-10-CM | POA: Diagnosis not present

## 2019-07-26 DIAGNOSIS — Z741 Need for assistance with personal care: Secondary | ICD-10-CM | POA: Diagnosis not present

## 2019-07-26 DIAGNOSIS — R54 Age-related physical debility: Secondary | ICD-10-CM | POA: Diagnosis not present

## 2019-07-26 DIAGNOSIS — Z7409 Other reduced mobility: Secondary | ICD-10-CM | POA: Diagnosis not present

## 2019-07-26 DIAGNOSIS — J9601 Acute respiratory failure with hypoxia: Secondary | ICD-10-CM | POA: Diagnosis not present

## 2019-07-26 DIAGNOSIS — U071 COVID-19: Secondary | ICD-10-CM | POA: Diagnosis not present

## 2019-07-26 DIAGNOSIS — M810 Age-related osteoporosis without current pathological fracture: Secondary | ICD-10-CM | POA: Diagnosis not present

## 2019-07-26 DIAGNOSIS — R1312 Dysphagia, oropharyngeal phase: Secondary | ICD-10-CM | POA: Diagnosis not present

## 2019-08-08 DIAGNOSIS — R1312 Dysphagia, oropharyngeal phase: Secondary | ICD-10-CM | POA: Diagnosis not present

## 2019-08-08 DIAGNOSIS — U071 COVID-19: Secondary | ICD-10-CM | POA: Diagnosis not present

## 2019-08-08 DIAGNOSIS — J9601 Acute respiratory failure with hypoxia: Secondary | ICD-10-CM | POA: Diagnosis not present

## 2019-08-10 DIAGNOSIS — D500 Iron deficiency anemia secondary to blood loss (chronic): Secondary | ICD-10-CM | POA: Diagnosis not present

## 2019-08-10 DIAGNOSIS — D649 Anemia, unspecified: Secondary | ICD-10-CM | POA: Diagnosis not present

## 2019-08-21 DIAGNOSIS — M19031 Primary osteoarthritis, right wrist: Secondary | ICD-10-CM | POA: Diagnosis not present

## 2019-08-22 DIAGNOSIS — L0390 Cellulitis, unspecified: Secondary | ICD-10-CM | POA: Diagnosis not present

## 2019-08-23 DIAGNOSIS — E039 Hypothyroidism, unspecified: Secondary | ICD-10-CM | POA: Diagnosis not present

## 2019-08-24 DIAGNOSIS — D649 Anemia, unspecified: Secondary | ICD-10-CM | POA: Diagnosis not present

## 2019-08-24 DIAGNOSIS — M6281 Muscle weakness (generalized): Secondary | ICD-10-CM | POA: Diagnosis not present

## 2019-08-24 DIAGNOSIS — E039 Hypothyroidism, unspecified: Secondary | ICD-10-CM | POA: Diagnosis not present

## 2019-08-24 DIAGNOSIS — I10 Essential (primary) hypertension: Secondary | ICD-10-CM | POA: Diagnosis not present

## 2019-08-26 DIAGNOSIS — E039 Hypothyroidism, unspecified: Secondary | ICD-10-CM | POA: Diagnosis not present

## 2019-09-08 DIAGNOSIS — M6281 Muscle weakness (generalized): Secondary | ICD-10-CM | POA: Diagnosis not present

## 2019-09-08 DIAGNOSIS — S72302D Unspecified fracture of shaft of left femur, subsequent encounter for closed fracture with routine healing: Secondary | ICD-10-CM | POA: Diagnosis not present

## 2019-09-08 DIAGNOSIS — M1712 Unilateral primary osteoarthritis, left knee: Secondary | ICD-10-CM | POA: Diagnosis not present

## 2019-09-14 DIAGNOSIS — M6281 Muscle weakness (generalized): Secondary | ICD-10-CM | POA: Diagnosis not present

## 2019-09-22 DIAGNOSIS — I10 Essential (primary) hypertension: Secondary | ICD-10-CM | POA: Diagnosis not present

## 2019-09-22 DIAGNOSIS — M6281 Muscle weakness (generalized): Secondary | ICD-10-CM | POA: Diagnosis not present

## 2019-09-22 DIAGNOSIS — Z9181 History of falling: Secondary | ICD-10-CM | POA: Diagnosis not present

## 2019-09-22 DIAGNOSIS — E039 Hypothyroidism, unspecified: Secondary | ICD-10-CM | POA: Diagnosis not present

## 2019-10-03 DIAGNOSIS — I10 Essential (primary) hypertension: Secondary | ICD-10-CM | POA: Diagnosis not present

## 2019-10-03 DIAGNOSIS — Z9181 History of falling: Secondary | ICD-10-CM | POA: Diagnosis not present

## 2019-10-03 DIAGNOSIS — M6281 Muscle weakness (generalized): Secondary | ICD-10-CM | POA: Diagnosis not present

## 2019-10-03 DIAGNOSIS — E039 Hypothyroidism, unspecified: Secondary | ICD-10-CM | POA: Diagnosis not present

## 2019-10-06 DIAGNOSIS — E039 Hypothyroidism, unspecified: Secondary | ICD-10-CM | POA: Diagnosis not present

## 2019-10-17 DIAGNOSIS — R05 Cough: Secondary | ICD-10-CM | POA: Diagnosis not present

## 2019-10-18 DIAGNOSIS — R05 Cough: Secondary | ICD-10-CM | POA: Diagnosis not present

## 2019-10-20 DIAGNOSIS — I10 Essential (primary) hypertension: Secondary | ICD-10-CM | POA: Diagnosis not present

## 2019-10-20 DIAGNOSIS — D649 Anemia, unspecified: Secondary | ICD-10-CM | POA: Diagnosis not present

## 2019-10-24 DIAGNOSIS — N3942 Incontinence without sensory awareness: Secondary | ICD-10-CM | POA: Diagnosis not present

## 2019-10-24 DIAGNOSIS — E039 Hypothyroidism, unspecified: Secondary | ICD-10-CM | POA: Diagnosis not present

## 2019-10-24 DIAGNOSIS — E559 Vitamin D deficiency, unspecified: Secondary | ICD-10-CM | POA: Diagnosis not present

## 2019-10-25 DIAGNOSIS — E039 Hypothyroidism, unspecified: Secondary | ICD-10-CM | POA: Diagnosis not present

## 2019-10-25 DIAGNOSIS — Z9181 History of falling: Secondary | ICD-10-CM | POA: Diagnosis not present

## 2019-10-25 DIAGNOSIS — E559 Vitamin D deficiency, unspecified: Secondary | ICD-10-CM | POA: Diagnosis not present

## 2019-10-25 DIAGNOSIS — I10 Essential (primary) hypertension: Secondary | ICD-10-CM | POA: Diagnosis not present

## 2019-10-31 DIAGNOSIS — G894 Chronic pain syndrome: Secondary | ICD-10-CM | POA: Diagnosis not present

## 2019-11-02 DIAGNOSIS — M6281 Muscle weakness (generalized): Secondary | ICD-10-CM | POA: Diagnosis not present

## 2019-11-02 DIAGNOSIS — E039 Hypothyroidism, unspecified: Secondary | ICD-10-CM | POA: Diagnosis not present

## 2019-11-02 DIAGNOSIS — E559 Vitamin D deficiency, unspecified: Secondary | ICD-10-CM | POA: Diagnosis not present

## 2019-11-02 DIAGNOSIS — G894 Chronic pain syndrome: Secondary | ICD-10-CM | POA: Diagnosis not present

## 2019-11-09 DIAGNOSIS — S72302D Unspecified fracture of shaft of left femur, subsequent encounter for closed fracture with routine healing: Secondary | ICD-10-CM | POA: Diagnosis not present

## 2019-11-09 DIAGNOSIS — M6281 Muscle weakness (generalized): Secondary | ICD-10-CM | POA: Diagnosis not present

## 2019-11-09 DIAGNOSIS — M1712 Unilateral primary osteoarthritis, left knee: Secondary | ICD-10-CM | POA: Diagnosis not present

## 2019-11-16 DIAGNOSIS — M79642 Pain in left hand: Secondary | ICD-10-CM | POA: Diagnosis not present

## 2019-11-16 DIAGNOSIS — M19042 Primary osteoarthritis, left hand: Secondary | ICD-10-CM | POA: Diagnosis not present

## 2019-11-18 DIAGNOSIS — D649 Anemia, unspecified: Secondary | ICD-10-CM | POA: Diagnosis not present

## 2019-11-18 DIAGNOSIS — E559 Vitamin D deficiency, unspecified: Secondary | ICD-10-CM | POA: Diagnosis not present

## 2019-11-18 DIAGNOSIS — E039 Hypothyroidism, unspecified: Secondary | ICD-10-CM | POA: Diagnosis not present

## 2019-11-18 DIAGNOSIS — M6281 Muscle weakness (generalized): Secondary | ICD-10-CM | POA: Diagnosis not present

## 2019-12-05 DIAGNOSIS — K5900 Constipation, unspecified: Secondary | ICD-10-CM | POA: Diagnosis not present

## 2019-12-05 DIAGNOSIS — M6281 Muscle weakness (generalized): Secondary | ICD-10-CM | POA: Diagnosis not present

## 2019-12-05 DIAGNOSIS — E559 Vitamin D deficiency, unspecified: Secondary | ICD-10-CM | POA: Diagnosis not present

## 2019-12-05 DIAGNOSIS — D649 Anemia, unspecified: Secondary | ICD-10-CM | POA: Diagnosis not present

## 2019-12-07 DIAGNOSIS — G894 Chronic pain syndrome: Secondary | ICD-10-CM | POA: Diagnosis not present

## 2019-12-21 DIAGNOSIS — M109 Gout, unspecified: Secondary | ICD-10-CM | POA: Diagnosis not present

## 2020-01-02 DIAGNOSIS — M6281 Muscle weakness (generalized): Secondary | ICD-10-CM | POA: Diagnosis not present

## 2020-01-02 DIAGNOSIS — G894 Chronic pain syndrome: Secondary | ICD-10-CM | POA: Diagnosis not present

## 2020-01-02 DIAGNOSIS — K5900 Constipation, unspecified: Secondary | ICD-10-CM | POA: Diagnosis not present

## 2020-01-02 DIAGNOSIS — D649 Anemia, unspecified: Secondary | ICD-10-CM | POA: Diagnosis not present

## 2020-01-09 DIAGNOSIS — G894 Chronic pain syndrome: Secondary | ICD-10-CM | POA: Diagnosis not present

## 2020-01-30 DIAGNOSIS — D649 Anemia, unspecified: Secondary | ICD-10-CM | POA: Diagnosis not present

## 2020-01-30 DIAGNOSIS — G894 Chronic pain syndrome: Secondary | ICD-10-CM | POA: Diagnosis not present

## 2020-01-30 DIAGNOSIS — M6281 Muscle weakness (generalized): Secondary | ICD-10-CM | POA: Diagnosis not present

## 2020-01-30 DIAGNOSIS — K5900 Constipation, unspecified: Secondary | ICD-10-CM | POA: Diagnosis not present

## 2020-02-03 DIAGNOSIS — I10 Essential (primary) hypertension: Secondary | ICD-10-CM | POA: Diagnosis not present

## 2020-02-03 DIAGNOSIS — E039 Hypothyroidism, unspecified: Secondary | ICD-10-CM | POA: Diagnosis not present

## 2020-02-03 DIAGNOSIS — M6281 Muscle weakness (generalized): Secondary | ICD-10-CM | POA: Diagnosis not present

## 2020-02-03 DIAGNOSIS — K5900 Constipation, unspecified: Secondary | ICD-10-CM | POA: Diagnosis not present

## 2020-02-13 DIAGNOSIS — E559 Vitamin D deficiency, unspecified: Secondary | ICD-10-CM | POA: Diagnosis not present

## 2020-02-15 DIAGNOSIS — G894 Chronic pain syndrome: Secondary | ICD-10-CM | POA: Diagnosis not present

## 2020-02-27 DIAGNOSIS — M6281 Muscle weakness (generalized): Secondary | ICD-10-CM | POA: Diagnosis not present

## 2020-02-27 DIAGNOSIS — G894 Chronic pain syndrome: Secondary | ICD-10-CM | POA: Diagnosis not present

## 2020-02-27 DIAGNOSIS — D649 Anemia, unspecified: Secondary | ICD-10-CM | POA: Diagnosis not present

## 2020-02-27 DIAGNOSIS — K5900 Constipation, unspecified: Secondary | ICD-10-CM | POA: Diagnosis not present

## 2020-02-28 DIAGNOSIS — M24571 Contracture, right ankle: Secondary | ICD-10-CM | POA: Diagnosis not present

## 2020-02-28 DIAGNOSIS — I739 Peripheral vascular disease, unspecified: Secondary | ICD-10-CM | POA: Diagnosis not present

## 2020-02-28 DIAGNOSIS — L602 Onychogryphosis: Secondary | ICD-10-CM | POA: Diagnosis not present

## 2020-02-28 DIAGNOSIS — D649 Anemia, unspecified: Secondary | ICD-10-CM | POA: Diagnosis not present

## 2020-02-28 DIAGNOSIS — M24572 Contracture, left ankle: Secondary | ICD-10-CM | POA: Diagnosis not present

## 2020-03-08 DIAGNOSIS — N390 Urinary tract infection, site not specified: Secondary | ICD-10-CM | POA: Diagnosis not present

## 2020-03-21 DIAGNOSIS — G894 Chronic pain syndrome: Secondary | ICD-10-CM | POA: Diagnosis not present

## 2020-03-26 DIAGNOSIS — D649 Anemia, unspecified: Secondary | ICD-10-CM | POA: Diagnosis not present

## 2020-03-26 DIAGNOSIS — K5900 Constipation, unspecified: Secondary | ICD-10-CM | POA: Diagnosis not present

## 2020-03-26 DIAGNOSIS — M6281 Muscle weakness (generalized): Secondary | ICD-10-CM | POA: Diagnosis not present

## 2020-03-26 DIAGNOSIS — G894 Chronic pain syndrome: Secondary | ICD-10-CM | POA: Diagnosis not present

## 2020-04-03 DIAGNOSIS — D649 Anemia, unspecified: Secondary | ICD-10-CM | POA: Diagnosis not present

## 2020-04-03 DIAGNOSIS — E039 Hypothyroidism, unspecified: Secondary | ICD-10-CM | POA: Diagnosis not present

## 2020-04-03 DIAGNOSIS — G894 Chronic pain syndrome: Secondary | ICD-10-CM | POA: Diagnosis not present

## 2020-04-03 DIAGNOSIS — I10 Essential (primary) hypertension: Secondary | ICD-10-CM | POA: Diagnosis not present

## 2020-05-16 DIAGNOSIS — G894 Chronic pain syndrome: Secondary | ICD-10-CM | POA: Diagnosis not present

## 2020-05-21 DIAGNOSIS — N390 Urinary tract infection, site not specified: Secondary | ICD-10-CM | POA: Diagnosis not present

## 2020-05-30 DIAGNOSIS — D649 Anemia, unspecified: Secondary | ICD-10-CM | POA: Diagnosis not present

## 2020-05-30 DIAGNOSIS — N3942 Incontinence without sensory awareness: Secondary | ICD-10-CM | POA: Diagnosis not present

## 2020-05-30 DIAGNOSIS — G894 Chronic pain syndrome: Secondary | ICD-10-CM | POA: Diagnosis not present

## 2020-05-30 DIAGNOSIS — I10 Essential (primary) hypertension: Secondary | ICD-10-CM | POA: Diagnosis not present

## 2020-06-11 DIAGNOSIS — R4182 Altered mental status, unspecified: Secondary | ICD-10-CM | POA: Diagnosis not present

## 2020-06-28 DIAGNOSIS — D649 Anemia, unspecified: Secondary | ICD-10-CM | POA: Diagnosis not present

## 2020-06-28 DIAGNOSIS — E039 Hypothyroidism, unspecified: Secondary | ICD-10-CM | POA: Diagnosis not present

## 2020-06-28 DIAGNOSIS — F419 Anxiety disorder, unspecified: Secondary | ICD-10-CM | POA: Diagnosis not present

## 2020-06-28 DIAGNOSIS — M6281 Muscle weakness (generalized): Secondary | ICD-10-CM | POA: Diagnosis not present

## 2020-07-18 DIAGNOSIS — C44622 Squamous cell carcinoma of skin of right upper limb, including shoulder: Secondary | ICD-10-CM | POA: Diagnosis not present

## 2020-07-18 DIAGNOSIS — L821 Other seborrheic keratosis: Secondary | ICD-10-CM | POA: Diagnosis not present

## 2020-07-18 DIAGNOSIS — L814 Other melanin hyperpigmentation: Secondary | ICD-10-CM | POA: Diagnosis not present

## 2020-07-18 DIAGNOSIS — D485 Neoplasm of uncertain behavior of skin: Secondary | ICD-10-CM | POA: Diagnosis not present

## 2020-07-26 IMAGING — CT CT ANGIO CHEST-ABD-PELV FOR DISSECTION W/ AND WO/W CM
2 of 7 series · 12 of 46 positions shown, 14 images · IV contrast (omnipaque)
Comparison: Chest radiograph earlier this day.

CLINICAL DATA: [AGE] with back pain after trip and fall.
Minor trauma. Hypertension. Rule out dissection.

EXAM:
CT ANGIOGRAPHY CHEST, ABDOMEN AND PELVIS
TECHNIQUE: Multidetector CT imaging through the chest, abdomen and pelvis was
performed using the standard protocol during bolus administration of
intravenous contrast. Multiplanar reconstructed images and MIPs were
obtained and reviewed to evaluate the vascular anatomy.
CONTRAST:  100mL OMNIPAQUE IOHEXOL 350 MG/ML SOLN

[Series 6: axial arterial · axial · arterial · 0.64mm/px · z∈[+1057,+1570]mm · 9 of 197 slices shown, 11 images]
[im 13/197  soft-tissue]
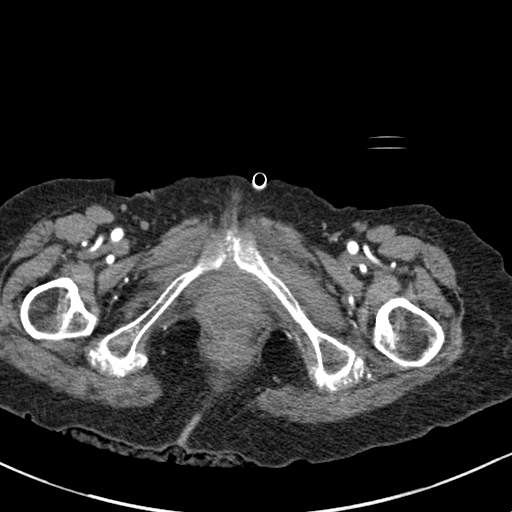
[im 13/197  bone]
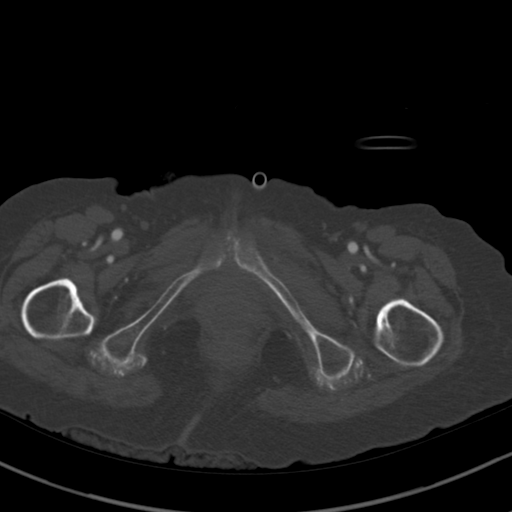
[im 37/197  soft-tissue]
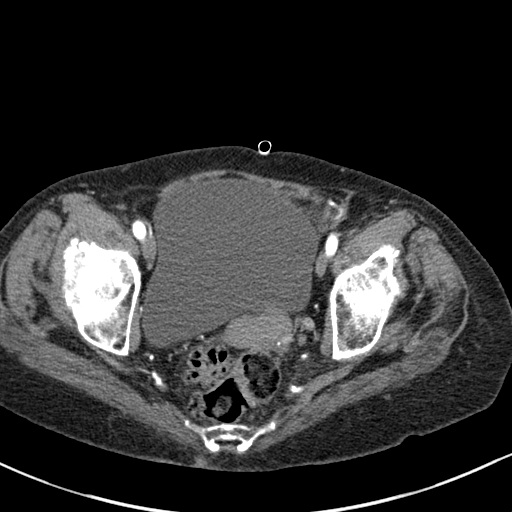
[im 62/197  soft-tissue]
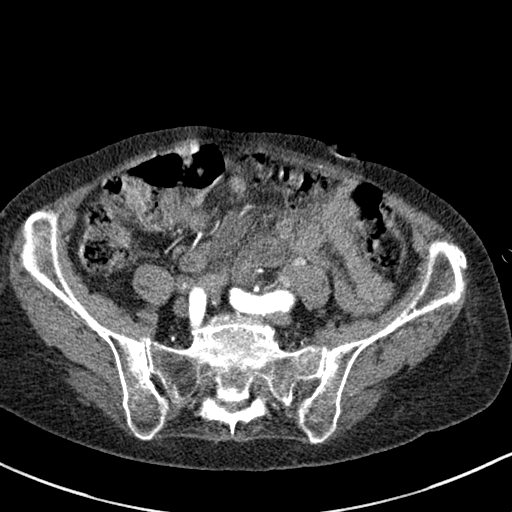
[im 74/197  soft-tissue]
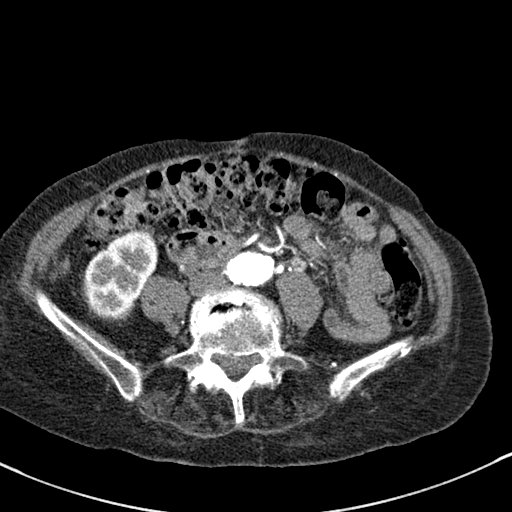
[im 99/197  soft-tissue]
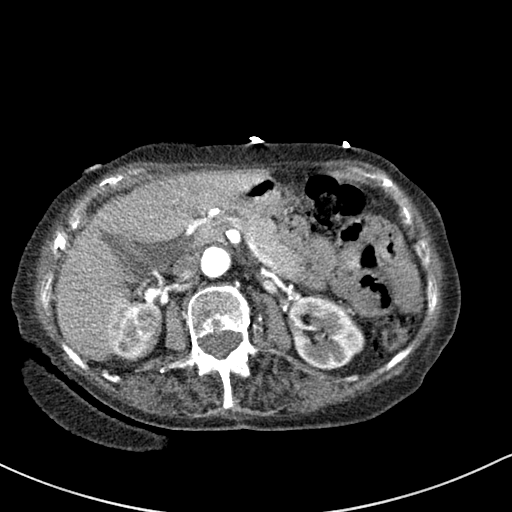
[im 123/197  soft-tissue]
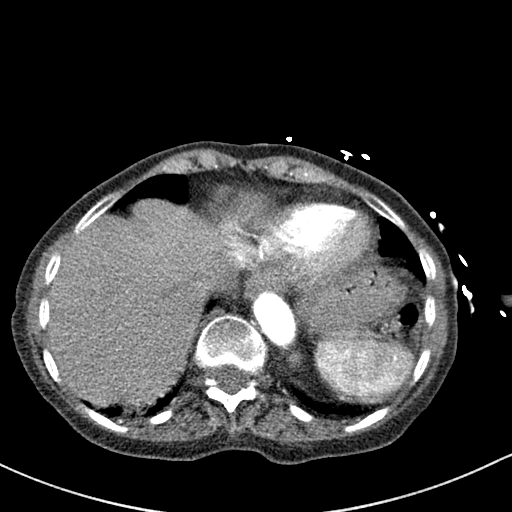
[im 135/197  soft-tissue]
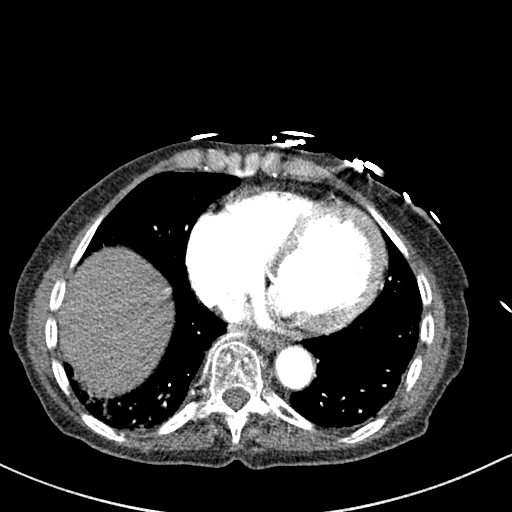
[im 160/197  soft-tissue]
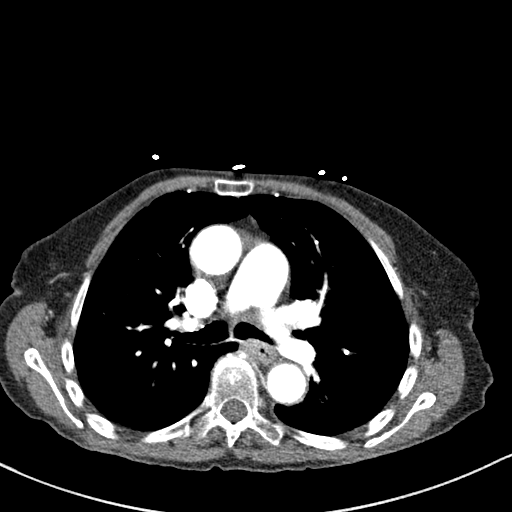
[im 184/197  soft-tissue]
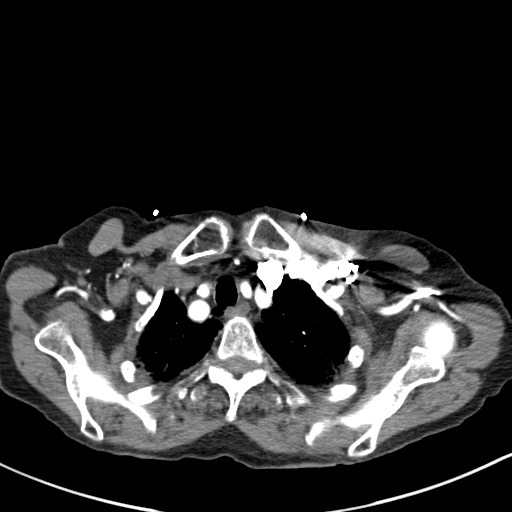
[im 184/197  bone]
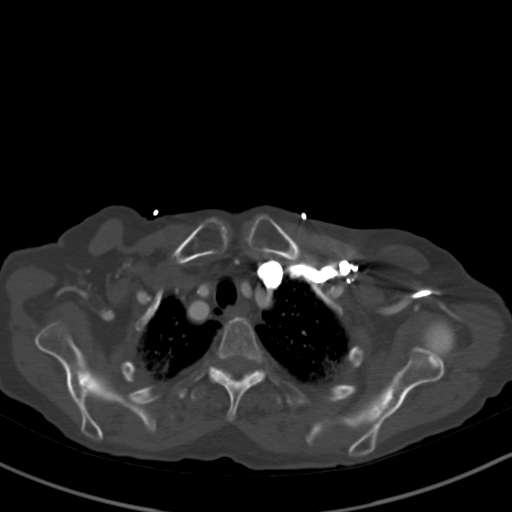

[Series 9: coronals · coronal · 0.66mm/px · 3 of 113 slices shown]
[im 29/113  soft-tissue]
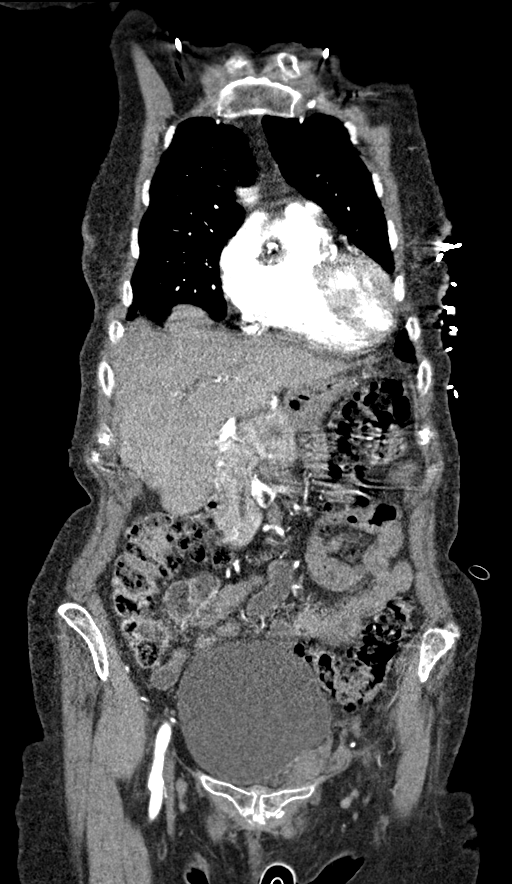
[im 57/113  soft-tissue]
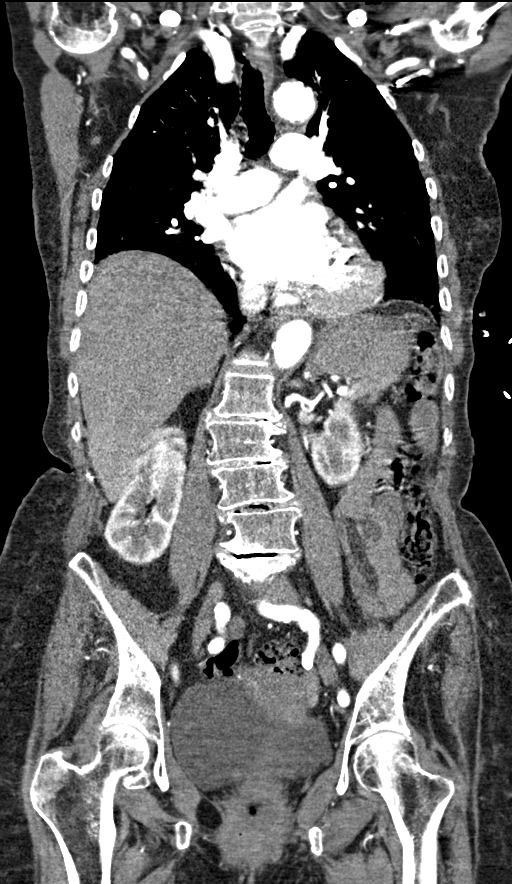
[im 85/113  soft-tissue]
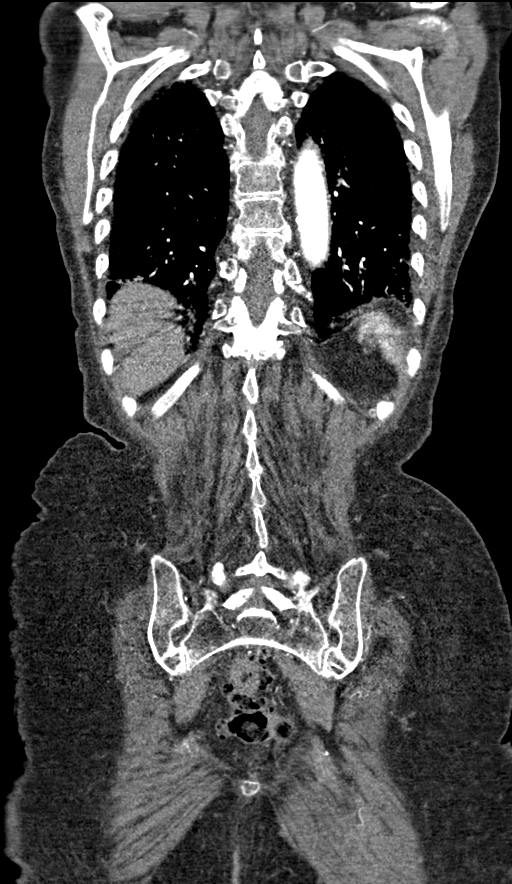

[12 of 46 positions shown; findings below may reference images not displayed]

FINDINGS: CTA CHEST FINDINGS

Cardiovascular: Noncontrast CT demonstrates no aortic hematoma. No
aneurysm. Moderate atherosclerosis of the thoracic aorta and branch
vessels. Post contrast there is no evidence of acute aortic injury,
dissection, vasculitis or acute aortic syndrome. No filling defects
in the central most pulmonary arteries to the lobar level. There are
coronary artery calcifications. Borderline cardiomegaly. No
pericardial effusion.

Mediastinum/Nodes: Calcified mediastinal lymph nodes consistent with
prior granulomatous disease. No noncalcified adenopathy. No aortic
hematoma or hemorrhage. No pneumomediastinum. No esophageal wall
thickening. No thyroid nodule.

Lungs/Pleura: No pneumothorax. Biapical pleuroparenchymal scarring.
Mild dependent atelectasis. Small intraparenchymal cyst in the left
lower lobe specific clinical significance. No pulmonary edema or
pleural effusion. Calcified granuloma in the right lower lobe.
Trachea and mainstem bronchi are patent, mild motion artifact
limitations.

Musculoskeletal: Thoracic spine better assessed on dedicated
thoracic spine reformats. T11 compression fracture. No acute
fracture of the sternum, ribs, included clavicles or shoulder
girdles.

Review of the MIP images confirms the above findings.

CTA ABDOMEN AND PELVIS FINDINGS

VASCULAR

Aorta: Atherosclerotic and tortuous. No dissection or evidence of
acute injury. No frank aneurysmal dilatation.

Celiac: No dissection or acute injury. Calcified plaque at the
origin causes approximately 50% luminal stenosis.

SMA: No dissection or acute injury. Calcified plaque at the origin
causes approximately 50% luminal narrowing. Branch vessels are
patent.

Renals: Plaque at the origin of both renal arteries. Approximately
50% luminal narrowing the the origin of both renal arteries, left
greater than right.

IMA: Patent without evidence of aneurysm, dissection, vasculitis or
significant stenosis.

Inflow: Atherosclerotic and tortuous. No dissection or acute
abnormality.

Veins: No obvious venous abnormality within the limitations of this
arterial phase study.

Review of the MIP images confirms the above findings.

NON-VASCULAR

Hepatobiliary: Arterial phase imaging limits evaluation for acute
injury. No evidence of focal hepatic abnormality. No perihepatic
hematoma. Gallbladder physiologically distended, no calcified stone.
No biliary dilatation.

Pancreas: No ductal dilatation or inflammation.

Spleen: Normal in size and arterial phase enhancement. Mild motion
artifact limits detailed assessment. No perisplenic hematoma.

Adrenals/Urinary Tract: No renal or adrenal hemorrhage. No
hydronephrosis. No perinephric edema. Distended urinary bladder
without wall thickening.

Stomach/Bowel: Colonic diverticulosis, advanced in the descending
and sigmoid colon. Motion artifact limits detailed bowel assessment.
No evidence of bowel inflammation or obstruction. No mesenteric
hematoma.

Lymphatic: No enlarged lymph nodes in the abdomen or pelvis.

Reproductive: Uterus and bilateral adnexa are unremarkable.

Other: No free air or free fluid. No confluent body wall contusion.

Musculoskeletal: Minimally displaced left superior pubic ramus
fracture extending to the pubic body. Nondisplaced left inferior
pubic ramus fracture. Left hip joint remains intact. Lumbar spine
better assessed on dedicated spine reformats. There are
enthesopathic changes of the ischial tuberosities.

Review of the MIP images confirms the above findings.
IMPRESSION: 1. No aortic dissection or acute aortic injury. Diffuse aortic
tortuosity and atherosclerosis.
2. Minimally displaced left superior and nondisplaced inferior pubic
ramus fractures.
3. No additional acute traumatic injury to the chest, abdomen, or
pelvis, allowing for limitations related to arterial phase imaging.
4. Colonic diverticulosis.
5. Thoracic and lumbar spine assessed on separately reported spine
reformats.

Aortic Atherosclerosis ([id number]).

## 2020-07-26 IMAGING — CT CT T SPINE W/O CM
2 of 3 series · 11 of 33 positions shown, 13 images · non-contrast
Comparison: None.

CLINICAL DATA: Blood fall, back pain, hypertension

EXAM:
CT LUMBAR SPINE WITHOUT CONTRAST
TECHNIQUE: Multidetector CT imaging of the lumbar spine was performed without
intravenous contrast administration. Multiplanar CT image
reconstructions were also generated.

[Series 4: t sp(ine soft · axial · 0.23mm/px · z∈[+1345,+1585]mm · 8 of 96 slices shown, 10 images]
[im 8/96  soft-tissue]
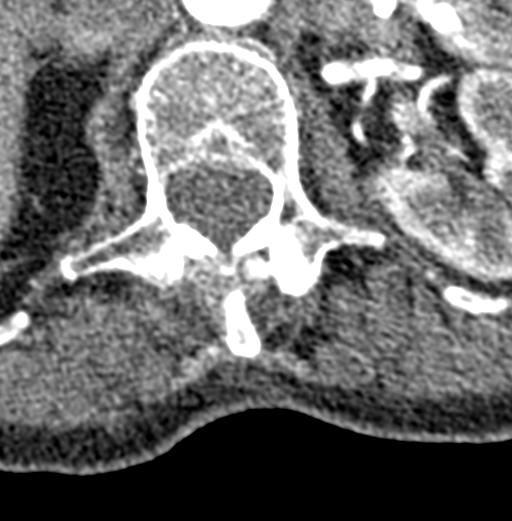
[im 8/96  bone]
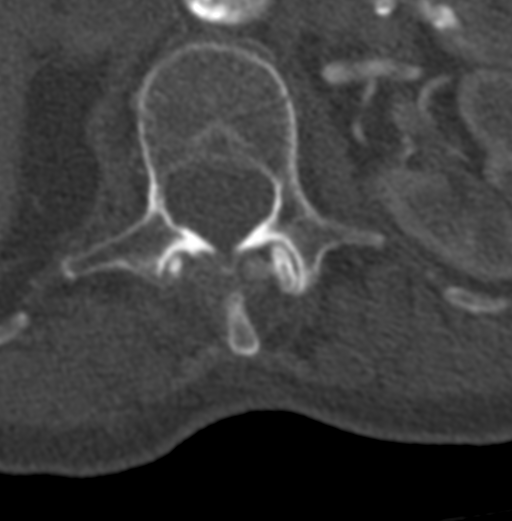
[im 22/96  bone]
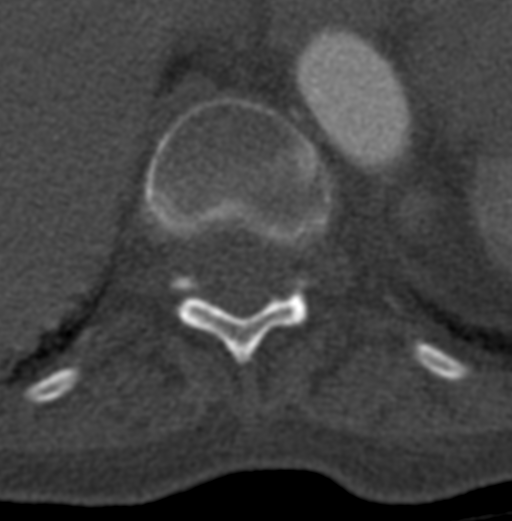
[im 30/96  bone]
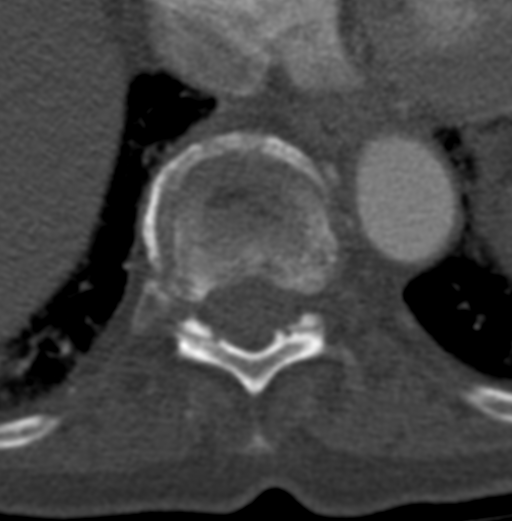
[im 44/96  bone]
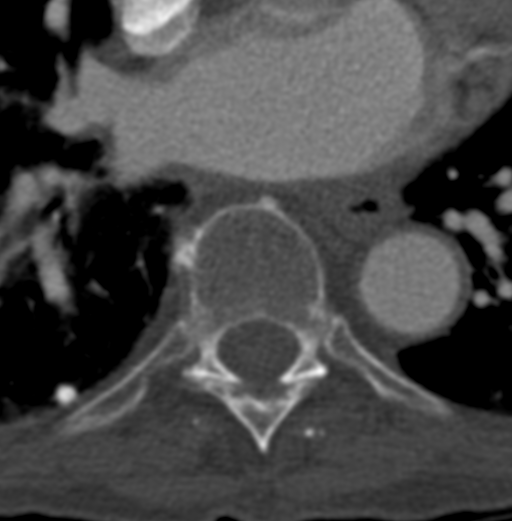
[im 52/96  soft-tissue]
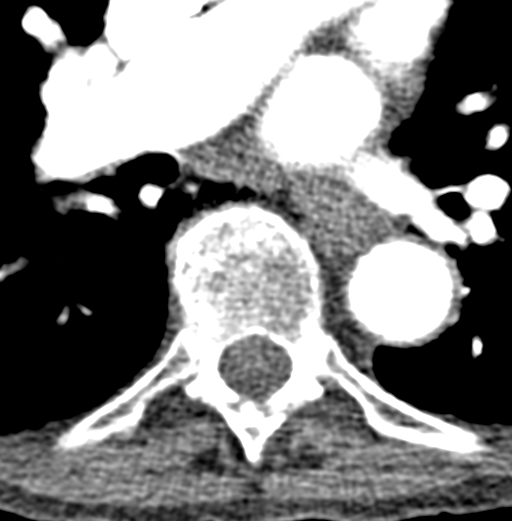
[im 52/96  bone]
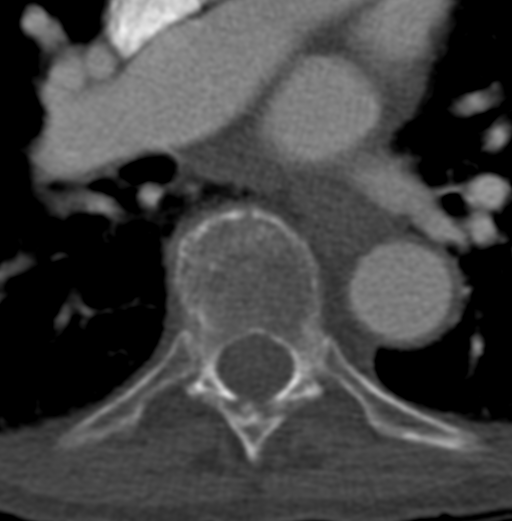
[im 66/96  bone]
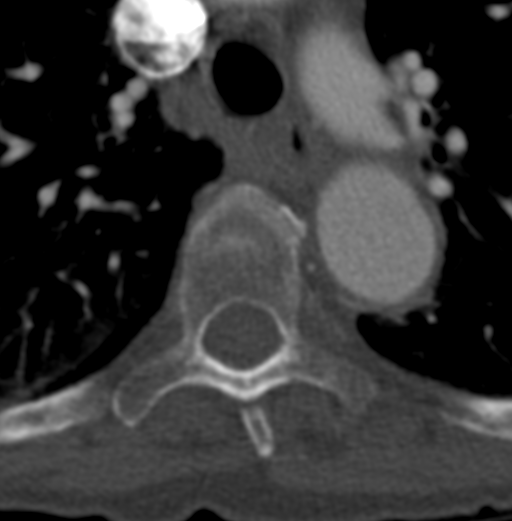
[im 74/96  bone]
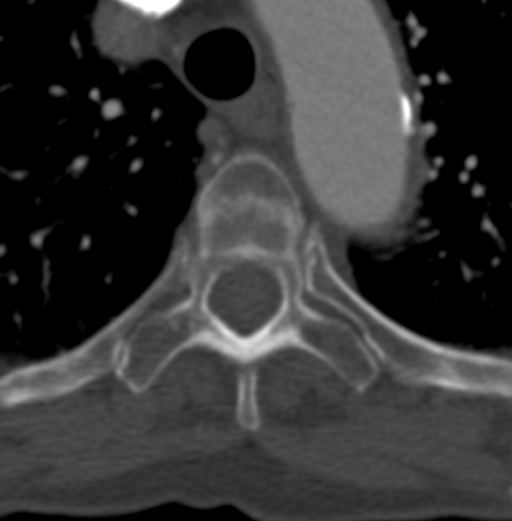
[im 88/96  bone]
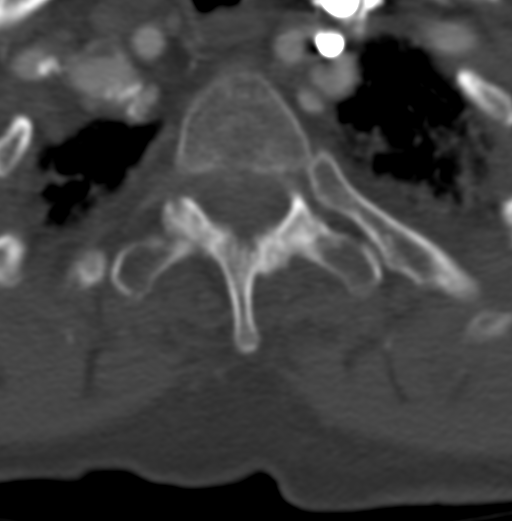

[Series 6: t spine cor · coronal · 0.23mm/px · 3 of 61 slices shown]
[im 13/61  bone]
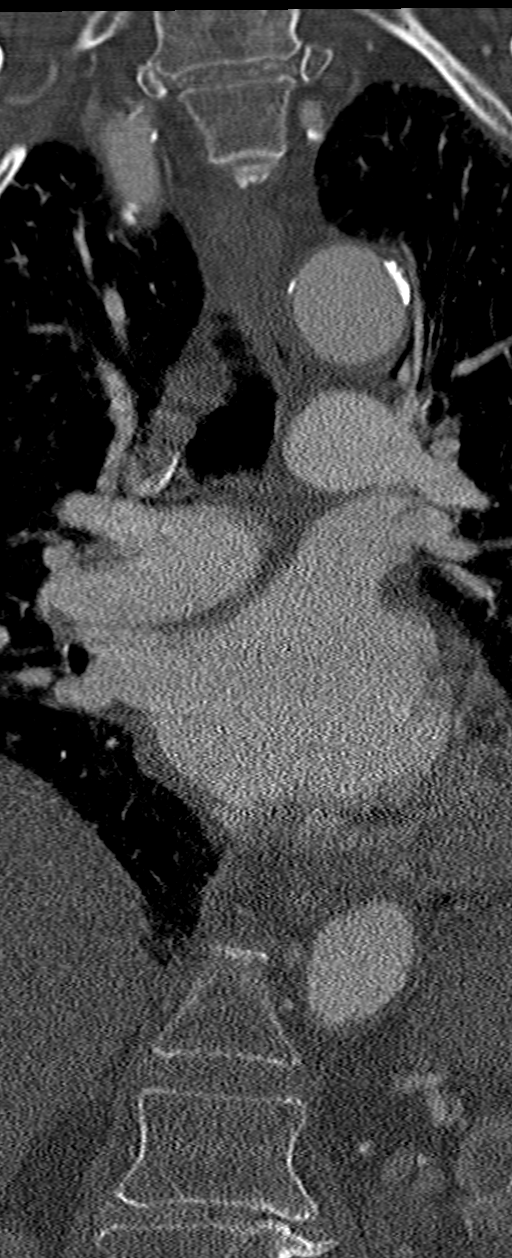
[im 25/61  bone]
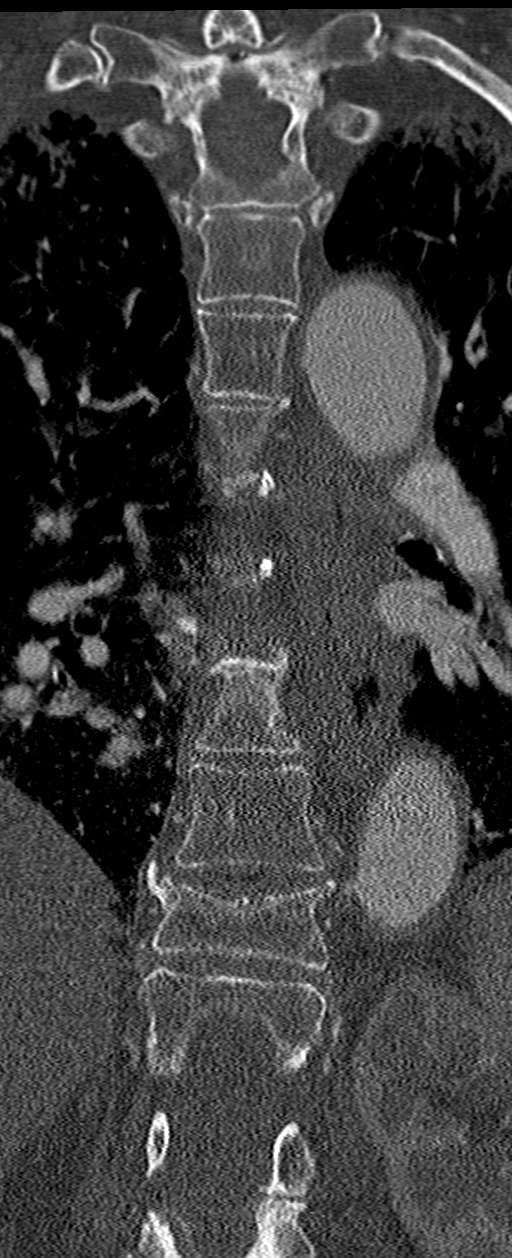
[im 37/61  bone]
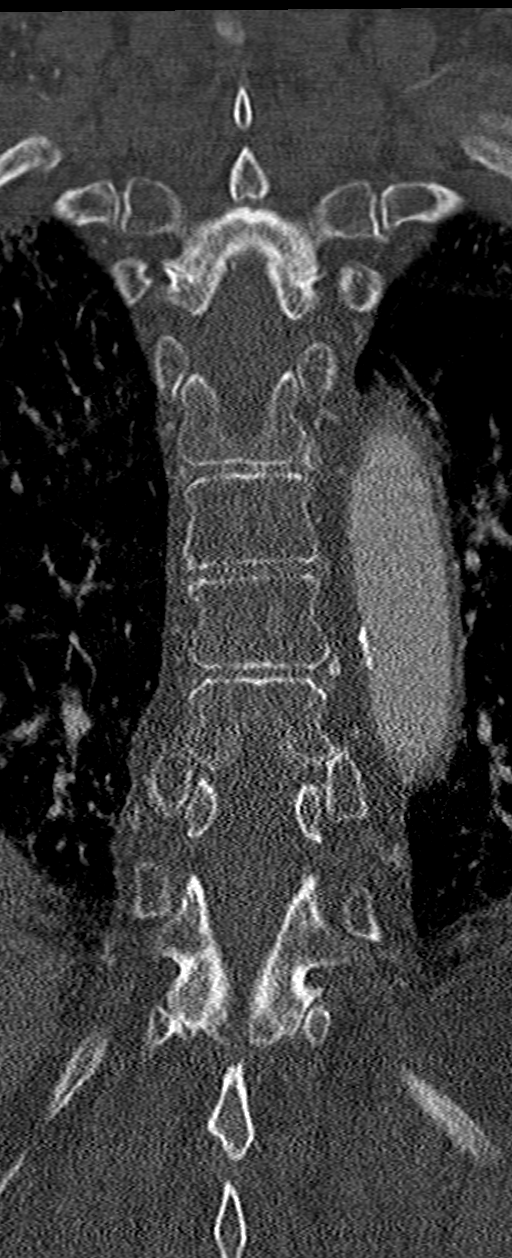

[11 of 33 positions shown; findings below may reference images not displayed]

FINDINGS: Thoracic spine:

Alignment: Normal

Vertebrae: There is a superior compression deformity of the T11
vertebral body with approximately 75% loss in vertebral body height.
There is buckling of the anterior superior and posterosuperior
cortex is. No retropulsion of fragments is seen.

Paraspinal and other soft tissues: The paraspinal soft tissues and
visualized retroperitoneal structures are unremarkable. The
sacroiliac joints are intact. Biapical scarring/atelectasis is seen.

Disc levels: Mild disc height loss with facet arthrosis is seen
throughout the thoracic spine. At T10-T11 there is buckling of the
superior cortex of T11 which causes mild canal narrowing. There is
also mild neural foraminal narrowing.

Lumbar spine:

Segmentation: There are 5 non-rib bearing lumbar type vertebral
bodies with the last intervertebral disc space labeled as L5-S1.

Alignment: There is a mild S-shaped scoliotic curvature of the
lumbar spine. Slight lateral listhesis of L2 on L3 and L3 on L4
seen.

Vertebrae: The vertebral body heights are well maintained. No
fracture, malalignment, or pathologic osseous lesions seen.

Paraspinal and other soft tissues: The paraspinal soft tissues and
visualized retroperitoneal structures are unremarkable. The
sacroiliac joints are intact. Scattered dense aortic atherosclerosis
is noted.

Disc levels:

There is disc height loss with facet arthrosis and vacuum phenomenon
seen throughout the lumbar spine. This is most notable at L2-L3 and
L3-L4 with severe left neural foraminal narrowing and moderate
central canal stenosis.
IMPRESSION: 1. Age indeterminate superior compression deformity of the T11
vertebral body with 75% loss in vertebral body height. This is new
since prior exam of 5337. There is buckling of the superior cortex.
However no retropulsion of fragments.
2. Mild S-shaped scoliotic curvature of the lumbar spine.
3. Lumbar spine spondylosis most notable at L2-L3 and L3-L4 with
severe left neural foraminal narrowing and moderate central canal
stenosis.

## 2020-08-09 DIAGNOSIS — S61401A Unspecified open wound of right hand, initial encounter: Secondary | ICD-10-CM | POA: Diagnosis not present

## 2020-08-13 DIAGNOSIS — C4492 Squamous cell carcinoma of skin, unspecified: Secondary | ICD-10-CM | POA: Diagnosis not present

## 2020-08-13 DIAGNOSIS — L821 Other seborrheic keratosis: Secondary | ICD-10-CM | POA: Diagnosis not present

## 2020-09-03 DIAGNOSIS — C44622 Squamous cell carcinoma of skin of right upper limb, including shoulder: Secondary | ICD-10-CM | POA: Diagnosis not present

## 2020-09-05 DIAGNOSIS — T8189XA Other complications of procedures, not elsewhere classified, initial encounter: Secondary | ICD-10-CM | POA: Diagnosis not present

## 2020-09-05 DIAGNOSIS — L539 Erythematous condition, unspecified: Secondary | ICD-10-CM | POA: Diagnosis not present

## 2020-09-05 DIAGNOSIS — C44622 Squamous cell carcinoma of skin of right upper limb, including shoulder: Secondary | ICD-10-CM | POA: Diagnosis not present

## 2020-09-06 DIAGNOSIS — I10 Essential (primary) hypertension: Secondary | ICD-10-CM | POA: Diagnosis not present

## 2020-09-06 DIAGNOSIS — L539 Erythematous condition, unspecified: Secondary | ICD-10-CM | POA: Diagnosis not present

## 2020-09-06 DIAGNOSIS — M109 Gout, unspecified: Secondary | ICD-10-CM | POA: Diagnosis not present

## 2020-09-06 DIAGNOSIS — T8189XA Other complications of procedures, not elsewhere classified, initial encounter: Secondary | ICD-10-CM | POA: Diagnosis not present

## 2020-09-06 DIAGNOSIS — Z9181 History of falling: Secondary | ICD-10-CM | POA: Diagnosis not present

## 2020-09-06 DIAGNOSIS — C44622 Squamous cell carcinoma of skin of right upper limb, including shoulder: Secondary | ICD-10-CM | POA: Diagnosis not present

## 2020-10-15 DIAGNOSIS — L539 Erythematous condition, unspecified: Secondary | ICD-10-CM | POA: Diagnosis not present

## 2020-10-15 DIAGNOSIS — I10 Essential (primary) hypertension: Secondary | ICD-10-CM | POA: Diagnosis not present

## 2020-10-22 DIAGNOSIS — I10 Essential (primary) hypertension: Secondary | ICD-10-CM | POA: Diagnosis not present

## 2020-10-22 DIAGNOSIS — L539 Erythematous condition, unspecified: Secondary | ICD-10-CM | POA: Diagnosis not present

## 2020-11-08 DIAGNOSIS — R531 Weakness: Secondary | ICD-10-CM | POA: Diagnosis not present

## 2020-11-08 DIAGNOSIS — R5383 Other fatigue: Secondary | ICD-10-CM | POA: Diagnosis not present

## 2020-11-08 DIAGNOSIS — R8290 Unspecified abnormal findings in urine: Secondary | ICD-10-CM | POA: Diagnosis not present

## 2020-11-08 DIAGNOSIS — R410 Disorientation, unspecified: Secondary | ICD-10-CM | POA: Diagnosis not present

## 2020-11-09 DIAGNOSIS — N390 Urinary tract infection, site not specified: Secondary | ICD-10-CM | POA: Diagnosis not present

## 2020-11-12 DIAGNOSIS — R410 Disorientation, unspecified: Secondary | ICD-10-CM | POA: Diagnosis not present

## 2020-11-12 DIAGNOSIS — N3000 Acute cystitis without hematuria: Secondary | ICD-10-CM | POA: Diagnosis not present

## 2020-11-12 DIAGNOSIS — R5383 Other fatigue: Secondary | ICD-10-CM | POA: Diagnosis not present

## 2020-11-12 DIAGNOSIS — R531 Weakness: Secondary | ICD-10-CM | POA: Diagnosis not present

## 2020-11-29 DIAGNOSIS — Z87440 Personal history of urinary (tract) infections: Secondary | ICD-10-CM | POA: Diagnosis not present

## 2020-11-29 DIAGNOSIS — I10 Essential (primary) hypertension: Secondary | ICD-10-CM | POA: Diagnosis not present

## 2020-12-17 DIAGNOSIS — F32A Depression, unspecified: Secondary | ICD-10-CM | POA: Diagnosis not present

## 2020-12-25 DIAGNOSIS — E039 Hypothyroidism, unspecified: Secondary | ICD-10-CM | POA: Diagnosis not present

## 2020-12-25 DIAGNOSIS — R635 Abnormal weight gain: Secondary | ICD-10-CM | POA: Diagnosis not present

## 2020-12-25 DIAGNOSIS — I10 Essential (primary) hypertension: Secondary | ICD-10-CM | POA: Diagnosis not present

## 2020-12-26 DIAGNOSIS — E039 Hypothyroidism, unspecified: Secondary | ICD-10-CM | POA: Diagnosis not present

## 2020-12-26 DIAGNOSIS — I10 Essential (primary) hypertension: Secondary | ICD-10-CM | POA: Diagnosis not present

## 2020-12-26 DIAGNOSIS — D72829 Elevated white blood cell count, unspecified: Secondary | ICD-10-CM | POA: Diagnosis not present

## 2020-12-28 DIAGNOSIS — R635 Abnormal weight gain: Secondary | ICD-10-CM | POA: Diagnosis not present

## 2020-12-28 DIAGNOSIS — R5383 Other fatigue: Secondary | ICD-10-CM | POA: Diagnosis not present

## 2020-12-28 DIAGNOSIS — E039 Hypothyroidism, unspecified: Secondary | ICD-10-CM | POA: Diagnosis not present

## 2020-12-31 DIAGNOSIS — F32A Depression, unspecified: Secondary | ICD-10-CM | POA: Diagnosis not present

## 2021-03-27 DIAGNOSIS — M810 Age-related osteoporosis without current pathological fracture: Secondary | ICD-10-CM | POA: Diagnosis not present

## 2021-03-27 DIAGNOSIS — E039 Hypothyroidism, unspecified: Secondary | ICD-10-CM | POA: Diagnosis not present

## 2021-03-27 DIAGNOSIS — I10 Essential (primary) hypertension: Secondary | ICD-10-CM | POA: Diagnosis not present

## 2021-03-27 DIAGNOSIS — K5900 Constipation, unspecified: Secondary | ICD-10-CM | POA: Diagnosis not present

## 2021-03-28 DIAGNOSIS — I10 Essential (primary) hypertension: Secondary | ICD-10-CM | POA: Diagnosis not present

## 2021-03-28 DIAGNOSIS — E039 Hypothyroidism, unspecified: Secondary | ICD-10-CM | POA: Diagnosis not present

## 2021-03-28 DIAGNOSIS — E08319 Diabetes mellitus due to underlying condition with unspecified diabetic retinopathy without macular edema: Secondary | ICD-10-CM | POA: Diagnosis not present

## 2021-03-28 DIAGNOSIS — D72829 Elevated white blood cell count, unspecified: Secondary | ICD-10-CM | POA: Diagnosis not present

## 2021-08-19 DIAGNOSIS — M810 Age-related osteoporosis without current pathological fracture: Secondary | ICD-10-CM | POA: Diagnosis not present

## 2021-08-19 DIAGNOSIS — I10 Essential (primary) hypertension: Secondary | ICD-10-CM | POA: Diagnosis not present

## 2021-08-19 DIAGNOSIS — H04129 Dry eye syndrome of unspecified lacrimal gland: Secondary | ICD-10-CM | POA: Diagnosis not present

## 2021-08-19 DIAGNOSIS — E039 Hypothyroidism, unspecified: Secondary | ICD-10-CM | POA: Diagnosis not present

## 2021-08-20 DIAGNOSIS — I10 Essential (primary) hypertension: Secondary | ICD-10-CM | POA: Diagnosis not present

## 2021-08-20 DIAGNOSIS — M150 Primary generalized (osteo)arthritis: Secondary | ICD-10-CM | POA: Diagnosis not present

## 2021-08-20 DIAGNOSIS — E559 Vitamin D deficiency, unspecified: Secondary | ICD-10-CM | POA: Diagnosis not present

## 2021-08-20 DIAGNOSIS — M10071 Idiopathic gout, right ankle and foot: Secondary | ICD-10-CM | POA: Diagnosis not present

## 2021-08-21 DIAGNOSIS — N182 Chronic kidney disease, stage 2 (mild): Secondary | ICD-10-CM | POA: Diagnosis not present

## 2021-08-21 DIAGNOSIS — E039 Hypothyroidism, unspecified: Secondary | ICD-10-CM | POA: Diagnosis not present

## 2021-08-21 DIAGNOSIS — E871 Hypo-osmolality and hyponatremia: Secondary | ICD-10-CM | POA: Diagnosis not present

## 2021-08-21 DIAGNOSIS — D649 Anemia, unspecified: Secondary | ICD-10-CM | POA: Diagnosis not present

## 2021-08-29 DIAGNOSIS — M150 Primary generalized (osteo)arthritis: Secondary | ICD-10-CM | POA: Diagnosis not present

## 2021-09-27 DIAGNOSIS — R52 Pain, unspecified: Secondary | ICD-10-CM | POA: Diagnosis not present

## 2021-09-27 DIAGNOSIS — R279 Unspecified lack of coordination: Secondary | ICD-10-CM | POA: Diagnosis not present

## 2021-09-30 DIAGNOSIS — R52 Pain, unspecified: Secondary | ICD-10-CM | POA: Diagnosis not present

## 2021-09-30 DIAGNOSIS — R279 Unspecified lack of coordination: Secondary | ICD-10-CM | POA: Diagnosis not present

## 2021-10-02 DIAGNOSIS — E0590 Thyrotoxicosis, unspecified without thyrotoxic crisis or storm: Secondary | ICD-10-CM | POA: Diagnosis not present

## 2021-10-02 DIAGNOSIS — R52 Pain, unspecified: Secondary | ICD-10-CM | POA: Diagnosis not present

## 2021-10-02 DIAGNOSIS — R279 Unspecified lack of coordination: Secondary | ICD-10-CM | POA: Diagnosis not present

## 2021-10-03 DIAGNOSIS — E039 Hypothyroidism, unspecified: Secondary | ICD-10-CM | POA: Diagnosis not present

## 2021-10-03 DIAGNOSIS — R109 Unspecified abdominal pain: Secondary | ICD-10-CM | POA: Diagnosis not present

## 2021-10-03 DIAGNOSIS — R410 Disorientation, unspecified: Secondary | ICD-10-CM | POA: Diagnosis not present

## 2021-10-03 DIAGNOSIS — R5381 Other malaise: Secondary | ICD-10-CM | POA: Diagnosis not present

## 2021-10-04 DIAGNOSIS — R279 Unspecified lack of coordination: Secondary | ICD-10-CM | POA: Diagnosis not present

## 2021-10-04 DIAGNOSIS — R52 Pain, unspecified: Secondary | ICD-10-CM | POA: Diagnosis not present

## 2021-10-10 DIAGNOSIS — Z87440 Personal history of urinary (tract) infections: Secondary | ICD-10-CM | POA: Diagnosis not present

## 2021-10-10 DIAGNOSIS — I10 Essential (primary) hypertension: Secondary | ICD-10-CM | POA: Diagnosis not present

## 2021-10-15 DIAGNOSIS — R279 Unspecified lack of coordination: Secondary | ICD-10-CM | POA: Diagnosis not present

## 2021-10-15 DIAGNOSIS — R52 Pain, unspecified: Secondary | ICD-10-CM | POA: Diagnosis not present

## 2021-11-27 ENCOUNTER — Telehealth: Payer: Self-pay | Admitting: Internal Medicine

## 2021-11-27 NOTE — Telephone Encounter (Signed)
Spoke with [facility] nursing re: [facility] patient.  Nurse reports patient pleasant and doing well.  She had just reassessed her buttocks wound.  Nursing had no new concerns.  No worries about decline at present.

## 2021-12-17 DIAGNOSIS — K5900 Constipation, unspecified: Secondary | ICD-10-CM | POA: Diagnosis not present

## 2021-12-17 DIAGNOSIS — I10 Essential (primary) hypertension: Secondary | ICD-10-CM | POA: Diagnosis not present

## 2021-12-17 DIAGNOSIS — M810 Age-related osteoporosis without current pathological fracture: Secondary | ICD-10-CM | POA: Diagnosis not present

## 2021-12-17 DIAGNOSIS — G4700 Insomnia, unspecified: Secondary | ICD-10-CM | POA: Diagnosis not present

## 2021-12-18 DIAGNOSIS — E0590 Thyrotoxicosis, unspecified without thyrotoxic crisis or storm: Secondary | ICD-10-CM | POA: Diagnosis not present

## 2021-12-18 DIAGNOSIS — E039 Hypothyroidism, unspecified: Secondary | ICD-10-CM | POA: Diagnosis not present

## 2021-12-18 DIAGNOSIS — I509 Heart failure, unspecified: Secondary | ICD-10-CM | POA: Diagnosis not present

## 2021-12-19 DIAGNOSIS — Z7689 Persons encountering health services in other specified circumstances: Secondary | ICD-10-CM | POA: Diagnosis not present

## 2021-12-19 DIAGNOSIS — N182 Chronic kidney disease, stage 2 (mild): Secondary | ICD-10-CM | POA: Diagnosis not present

## 2021-12-19 DIAGNOSIS — D649 Anemia, unspecified: Secondary | ICD-10-CM | POA: Diagnosis not present

## 2022-01-02 DIAGNOSIS — I959 Hypotension, unspecified: Secondary | ICD-10-CM | POA: Diagnosis not present

## 2022-01-29 ENCOUNTER — Non-Acute Institutional Stay: Payer: Medicare Other | Admitting: Internal Medicine

## 2022-01-29 VITALS — BP 119/54 | HR 85 | Temp 97.8°F | Resp 18 | Wt 145.0 lb

## 2022-01-29 DIAGNOSIS — Z515 Encounter for palliative care: Secondary | ICD-10-CM | POA: Diagnosis not present

## 2022-01-29 NOTE — Progress Notes (Signed)
[facility] Follow-Up Visit Telephone: [phone]  Fax: [phone]   Date of encounter: 01/29/22 PATIENT NAME: [name] Love [address] [place] [address]   [phone] (home)  DOB: [birth year] MRN: [id number] PRIMARY CARE PROVIDER:    Lajean Manes, MD,  [place] [place] [address] [phone]  REFERRING PROVIDER:   Lajean Manes, MD [address] [place],  [place] [address] [phone]  RESPONSIBLE PARTY:    Contact Information     Name Relation Home Work [place] Other [place] Daughter [phone]  [phone]        I met face to face with patient and family in her [facility] room. Palliative Care was asked to follow this patient by consultation request of  [name] Manes, MD to address advance care planning and complex medical decision making. This is follow-up visit.                                     ASSESSMENT AND PLAN / RECOMMENDATIONS:   Advance Care Planning/Goals of Care: Goals include to maximize quality of life and symptom management. Patient/health care surrogate gave his/her permission to discuss.Our advance care planning conversation included a discussion about:    The value and importance of advance care planning  Experiences with loved ones who have been seriously ill or have died  Exploration of personal, cultural or spiritual beliefs that might influence medical decisions  Exploration of goals of care in the event of a sudden injury or illness  Identification  of a healthcare agent  Review and updating or creation of an  advance directive document . Decision not to resuscitate or to de-escalate disease focused treatments due to poor prognosis. CODE STATUS:  old  MOST in epic with limited additional interventions, detemine abx at the time, no IVF and no feeding tubes; however, facility providers  have discussed with pt and family and DNR on file now  Symptom Management/Plan: 1. Palliative care encounter -pt doing ok, no new concerns or issues, code status DNR, goals are comfort based -continue to follow given advanced age and bedbound -intake good with weight gain -f/u 3 months    Follow up Palliative Care Visit: Palliative care will continue to follow for complex medical decision making, advance care planning, and clarification of goals. Return 12 weeks or prn.  This visit was coded based on medical decision making (MDM).  PPS: 40%  HOSPICE ELIGIBILITY/DIAGNOSIS: TBD  Chief Complaint: Follow-up palliative visit  HISTORY OF PRESENT ILLNESS:  [name] Love is a 88 y.o. year old female  with CKD3, carotid stenosis, hypertension, orthostatic hypotension, hypothyroidism, and left pubic ramus fracture in 03/2019 who has been at [facility] for long-term care.  When I spoke with nursing in June, I was told she had not had any recent changes.     Nursing staff asked for me to f/u on patient from the palliative perspective.  She has actually been gaining weight since Nov with latest weight up to 145 lbs from 124 lbs in Nov.  She is eating her meals in entirety often.  She has no pressure injuries at present (healed) though she has a skin tear on her right shin.  She has no complaints, says she's hanging in.  Denied pain or constipation.  Reports sleeping well.  Nursing without concerns.   Nursing shows she has DNR status on file.  MOST in Epic still indicates full code.  History obtained from review of EMR, discussion with primary team, and interview with family, facility staff/caregiver and/or [name] Love.   I reviewed available labs, medications, imaging, studies and related documents from the EMR.  Records reviewed and summarized above.   ROS Review of Systems see hpi  Physical Exam: Vitals:   01/29/22 0655  BP: (!) 119/54  Pulse: 85  Resp: 18  Temp: 97.8 F (36.6 C)   SpO2: 90%  Weight: 145 lb (65.8 kg)   Body mass index is 24.13 kg/m. Wt Readings from Last 500 Encounters:  01/29/22 145 lb (65.8 kg)  03/31/19 123 lb 14.4 oz (56.2 kg)  04/14/17 146 lb (66.2 kg)  03/31/17 150 lb (68 kg)  03/17/17 145 lb (65.8 kg)  04/08/16 157 lb 3.2 oz (71.3 kg)  03/28/15 152 lb (68.9 kg)  03/09/14 156 lb (70.8 kg)  03/08/13 153 lb (69.4 kg)  02/04/12 154 lb 3.2 oz (69.9 kg)  02/04/11 152 lb (68.9 kg)   Physical Exam Constitutional:      General: She is not in acute distress.    Appearance: She is not toxic-appearing.  HENT:     Head: Normocephalic and atraumatic.     Right Ear: External ear normal.     Left Ear: External ear normal.     Ears:     Comments: HOH    Nose: No congestion.     Mouth/Throat:     Pharynx: Oropharynx is clear.  Eyes:     Conjunctiva/sclera: Conjunctivae normal.     Pupils: Pupils are equal, round, and reactive to light.  Cardiovascular:     Rate and Rhythm: Normal rate and regular rhythm.  Pulmonary:     Effort: Pulmonary effort is normal.     Breath sounds: Normal breath sounds. No wheezing, rhonchi or rales.  Abdominal:     General: Bowel sounds are normal. There is no distension.     Palpations: Abdomen is soft.     Tenderness: There is no abdominal tenderness. There is no guarding or rebound.  Musculoskeletal:     Right lower leg: No edema.     Left lower leg: No edema.     Comments: Resting in bed with head tilted to right  Skin:    General: Skin is warm and dry.     Coloration: Skin is pale.     Comments: Skin tear medial right shin with dressing in place, no surrounding erythema warmth or drainage  Neurological:     Mental Status: She is alert. Mental status is at baseline.     Motor: Weakness present.  Psychiatric:        Mood and Affect: Mood normal.     CURRENT PROBLEM LIST:  Patient Active Problem List   Diagnosis Date Noted   Pubic ramus fracture, left, closed, initial encounter ([place]) 03/31/2019    Elevated troponin 03/31/2019   Hypertensive urgency 03/31/2019   Acute respiratory failure with hypoxia ([place]) 03/31/2019   Hypothyroidism 03/31/2019   CKD (chronic kidney disease), stage III ([place]) 03/31/2019   Orthostatic hypotension 03/31/2019   Carotid stenosis 03/09/2014   Aftercare following surgery of the circulatory system 03/09/2014   Aftercare following surgery of the circulatory system, NEC 03/08/2013   Occlusion and stenosis of carotid artery without mention of cerebral infarction 02/04/2012    PAST MEDICAL HISTORY:  Active Ambulatory Problems  Diagnosis Date Noted   Occlusion and stenosis of carotid artery without mention of cerebral infarction 02/04/2012   Aftercare following surgery of the circulatory system, NEC 03/08/2013   Carotid stenosis 03/09/2014   Aftercare following surgery of the circulatory system 03/09/2014   Pubic ramus fracture, left, closed, initial encounter ([place]) 03/31/2019   Elevated troponin 03/31/2019   Hypertensive urgency 03/31/2019   Acute respiratory failure with hypoxia ([place]) 03/31/2019   Hypothyroidism 03/31/2019   CKD (chronic kidney disease), stage III ([place]) 03/31/2019   Orthostatic hypotension 03/31/2019   Resolved Ambulatory Problems    Diagnosis Date Noted   No Resolved Ambulatory Problems   Past Medical History:  Diagnosis Date   Atrial fibrillation ([place])    Carotid artery occlusion    Constipation 02/04/2008   Diverticulosis of colon    Dizziness    Esophageal reflux    Hyperlipidemia    Hypertension    Thyroid disease    Urticaria    Varicose veins     SOCIAL HX:  Social History   Tobacco Use   Smoking status: Never   Smokeless tobacco: Never  Substance Use Topics   Alcohol use: Yes    Alcohol/week: 0.0 standard drinks of alcohol    Comment: a glass of wine every so often per pt     ALLERGIES: No Known Allergies    PERTINENT MEDICATIONS:  Outpatient Encounter Medications as of 01/29/2022  Medication Sig    acetaminophen (TYLENOL) 500 MG tablet Take 500 mg by mouth every 6 (six) hours as needed for mild pain, moderate pain, fever or headache.   aspirin 81 MG tablet Take 81 mg by mouth daily.   Calcium Carbonate-Vitamin D (OSCAL 500/200 D-3 PO) Take 1 tablet by mouth daily.   levothyroxine (SYNTHROID) 112 MCG tablet Take 112 mcg by mouth daily before breakfast.   Melatonin 3 MG TABS Take 3 mg by mouth at bedtime.   metoprolol tartrate (LOPRESSOR) 25 MG tablet Take 1 tablet (25 mg total) by mouth 2 (two) times daily. Hold for supine SBP<130, HR <60   Multiple Vitamin (MULTIVITAMIN WITH MINERALS) TABS tablet Take 1 tablet by mouth daily.   polyethylene glycol (MIRALAX / GLYCOLAX) 17 g packet Take 17 g by mouth daily as needed for mild constipation.   Saccharomyces boulardii (FLORASTOR PO) Take 1 tablet by mouth daily.   No facility-administered encounter medications on file as of 01/29/2022.    Thank you for the opportunity to participate in the care of [name] Love.  The palliative care team will continue to follow. Please call our office at [phone] if we can be of additional assistance.   Hollace Kinnier, DO  COVID-19 PATIENT SCREENING TOOL Asked and negative response unless otherwise noted:  Have you had symptoms of covid, tested positive or been in contact with someone with symptoms/positive test in the past 5-10 days? No

## 2022-01-31 DIAGNOSIS — K5900 Constipation, unspecified: Secondary | ICD-10-CM | POA: Diagnosis not present

## 2022-01-31 DIAGNOSIS — I10 Essential (primary) hypertension: Secondary | ICD-10-CM | POA: Diagnosis not present

## 2022-01-31 DIAGNOSIS — G4700 Insomnia, unspecified: Secondary | ICD-10-CM | POA: Diagnosis not present

## 2022-01-31 DIAGNOSIS — E039 Hypothyroidism, unspecified: Secondary | ICD-10-CM | POA: Diagnosis not present

## 2022-02-06 ENCOUNTER — Encounter: Payer: Self-pay | Admitting: Internal Medicine

## 2022-03-12 DIAGNOSIS — B379 Candidiasis, unspecified: Secondary | ICD-10-CM | POA: Diagnosis not present

## 2022-03-12 DIAGNOSIS — L259 Unspecified contact dermatitis, unspecified cause: Secondary | ICD-10-CM | POA: Diagnosis not present

## 2022-03-14 DIAGNOSIS — B029 Zoster without complications: Secondary | ICD-10-CM | POA: Diagnosis not present

## 2022-03-14 DIAGNOSIS — L03313 Cellulitis of chest wall: Secondary | ICD-10-CM | POA: Diagnosis not present

## 2022-03-19 DIAGNOSIS — B029 Zoster without complications: Secondary | ICD-10-CM | POA: Diagnosis not present

## 2022-03-19 DIAGNOSIS — L03313 Cellulitis of chest wall: Secondary | ICD-10-CM | POA: Diagnosis not present

## 2022-03-20 DIAGNOSIS — I509 Heart failure, unspecified: Secondary | ICD-10-CM | POA: Diagnosis not present

## 2022-03-20 DIAGNOSIS — N6314 Unspecified lump in the right breast, lower inner quadrant: Secondary | ICD-10-CM | POA: Diagnosis not present

## 2022-03-20 DIAGNOSIS — I10 Essential (primary) hypertension: Secondary | ICD-10-CM | POA: Diagnosis not present

## 2022-03-20 DIAGNOSIS — D72829 Elevated white blood cell count, unspecified: Secondary | ICD-10-CM | POA: Diagnosis not present

## 2022-03-21 DIAGNOSIS — L03313 Cellulitis of chest wall: Secondary | ICD-10-CM | POA: Diagnosis not present

## 2022-03-21 DIAGNOSIS — E871 Hypo-osmolality and hyponatremia: Secondary | ICD-10-CM | POA: Diagnosis not present

## 2022-03-21 DIAGNOSIS — N182 Chronic kidney disease, stage 2 (mild): Secondary | ICD-10-CM | POA: Diagnosis not present

## 2022-03-27 DIAGNOSIS — L03313 Cellulitis of chest wall: Secondary | ICD-10-CM | POA: Diagnosis not present

## 2022-03-27 DIAGNOSIS — R634 Abnormal weight loss: Secondary | ICD-10-CM | POA: Diagnosis not present

## 2022-03-27 DIAGNOSIS — R5383 Other fatigue: Secondary | ICD-10-CM | POA: Diagnosis not present

## 2022-04-02 DIAGNOSIS — E039 Hypothyroidism, unspecified: Secondary | ICD-10-CM | POA: Diagnosis not present

## 2022-04-03 DIAGNOSIS — E039 Hypothyroidism, unspecified: Secondary | ICD-10-CM | POA: Diagnosis not present

## 2022-04-03 DIAGNOSIS — L03313 Cellulitis of chest wall: Secondary | ICD-10-CM | POA: Diagnosis not present

## 2022-05-09 DEATH — deceased
